# Patient Record
Sex: Female | Born: 1960 | Race: White | Hispanic: No | Marital: Married | Smoking: Former smoker
Health system: Southern US, Community
[De-identification: ages and names within clinical notes are randomized; demographics above are authoritative.]

## PROBLEM LIST (undated history)

## (undated) DIAGNOSIS — E782 Mixed hyperlipidemia: Secondary | ICD-10-CM

## (undated) DIAGNOSIS — I10 Essential (primary) hypertension: Secondary | ICD-10-CM

## (undated) DIAGNOSIS — I2510 Atherosclerotic heart disease of native coronary artery without angina pectoris: Secondary | ICD-10-CM

## (undated) DIAGNOSIS — I214 Non-ST elevation (NSTEMI) myocardial infarction: Secondary | ICD-10-CM

## (undated) DIAGNOSIS — E039 Hypothyroidism, unspecified: Secondary | ICD-10-CM

## (undated) DIAGNOSIS — J449 Chronic obstructive pulmonary disease, unspecified: Secondary | ICD-10-CM

## (undated) HISTORY — DX: Essential (primary) hypertension: I10

## (undated) HISTORY — DX: Non-ST elevation (NSTEMI) myocardial infarction: I21.4

## (undated) HISTORY — DX: Atherosclerotic heart disease of native coronary artery without angina pectoris: I25.10

## (undated) HISTORY — PX: TOTAL ABDOMINAL HYSTERECTOMY: SHX209

## (undated) HISTORY — PX: BREAST LUMPECTOMY: SHX2

## (undated) HISTORY — PX: TUMOR REMOVAL: SHX12

## (undated) HISTORY — DX: Mixed hyperlipidemia: E78.2

## (undated) HISTORY — DX: Hypothyroidism, unspecified: E03.9

---

## 2008-02-11 ENCOUNTER — Inpatient Hospital Stay (HOSPITAL_COMMUNITY): Admission: AD | Admit: 2008-02-11 | Discharge: 2008-02-16 | Payer: Self-pay | Admitting: Cardiology

## 2008-02-11 ENCOUNTER — Encounter: Payer: Self-pay | Admitting: Cardiology

## 2008-02-11 ENCOUNTER — Ambulatory Visit: Payer: Self-pay | Admitting: Cardiology

## 2008-03-06 ENCOUNTER — Encounter: Payer: Self-pay | Admitting: Physician Assistant

## 2008-03-06 ENCOUNTER — Ambulatory Visit: Payer: Self-pay | Admitting: Cardiology

## 2008-07-05 ENCOUNTER — Encounter: Payer: Self-pay | Admitting: Cardiology

## 2009-02-07 ENCOUNTER — Encounter: Payer: Self-pay | Admitting: Cardiology

## 2009-03-29 ENCOUNTER — Encounter: Payer: Self-pay | Admitting: Cardiology

## 2009-03-30 ENCOUNTER — Encounter (INDEPENDENT_AMBULATORY_CARE_PROVIDER_SITE_OTHER): Payer: Self-pay | Admitting: *Deleted

## 2009-05-15 DIAGNOSIS — E782 Mixed hyperlipidemia: Secondary | ICD-10-CM | POA: Insufficient documentation

## 2009-05-15 DIAGNOSIS — I2510 Atherosclerotic heart disease of native coronary artery without angina pectoris: Secondary | ICD-10-CM | POA: Insufficient documentation

## 2009-05-15 DIAGNOSIS — I10 Essential (primary) hypertension: Secondary | ICD-10-CM | POA: Insufficient documentation

## 2009-05-21 ENCOUNTER — Ambulatory Visit: Payer: Self-pay | Admitting: Cardiology

## 2009-05-21 DIAGNOSIS — J4489 Other specified chronic obstructive pulmonary disease: Secondary | ICD-10-CM | POA: Insufficient documentation

## 2009-05-21 DIAGNOSIS — R0602 Shortness of breath: Secondary | ICD-10-CM | POA: Insufficient documentation

## 2009-05-21 DIAGNOSIS — J449 Chronic obstructive pulmonary disease, unspecified: Secondary | ICD-10-CM | POA: Insufficient documentation

## 2009-06-06 ENCOUNTER — Encounter: Payer: Self-pay | Admitting: Cardiology

## 2009-06-25 ENCOUNTER — Encounter (INDEPENDENT_AMBULATORY_CARE_PROVIDER_SITE_OTHER): Payer: Self-pay | Admitting: *Deleted

## 2009-11-28 ENCOUNTER — Ambulatory Visit: Payer: Self-pay | Admitting: Cardiology

## 2009-12-04 ENCOUNTER — Encounter: Payer: Self-pay | Admitting: Cardiology

## 2009-12-12 ENCOUNTER — Encounter (INDEPENDENT_AMBULATORY_CARE_PROVIDER_SITE_OTHER): Payer: Self-pay | Admitting: *Deleted

## 2009-12-24 ENCOUNTER — Encounter (INDEPENDENT_AMBULATORY_CARE_PROVIDER_SITE_OTHER): Payer: Self-pay | Admitting: *Deleted

## 2009-12-25 ENCOUNTER — Telehealth (INDEPENDENT_AMBULATORY_CARE_PROVIDER_SITE_OTHER): Payer: Self-pay | Admitting: *Deleted

## 2010-02-12 ENCOUNTER — Ambulatory Visit (HOSPITAL_COMMUNITY): Admission: RE | Admit: 2010-02-12 | Discharge: 2010-02-12 | Payer: Self-pay | Admitting: Family Medicine

## 2010-10-22 NOTE — Assessment & Plan Note (Signed)
Summary: 6 mo fu per feb reminder-srs  Medications Added LEVOTHYROXINE SODIUM 100 MCG TABS (LEVOTHYROXINE SODIUM) Take 1 tablet by mouth once a day      Allergies Added: NKDA  Visit Type:  Follow-up Primary Provider:  Dr. Tessie Fass  CC:  follow-up visit.  History of Present Illness: the patient is a 50 year old female with a history of coronary artery disease, status post non-ST segment elevation myocardial infarction. The patient is status post placement of a Cypher drug-eluting stent to the right coronary artery 2 years ago. Chest otherwise mild nonobstructive coronary artery disease. Just preserved ejection fraction 65%. She has stopped smoking. She denies any chest pain shortness of breath orthopnea PND. She does report some weight gain. Last lipid panel was drawn and sent them for. Denies any palpitations or syncope. He does report some shortness of breath on exertion related to her COPD   Preventive Screening-Counseling & Management  Alcohol-Tobacco     Smoking Status: quit     Year Quit: 2005  Current Medications (verified): 1)  Simvastatin 40 Mg Tabs (Simvastatin) .... Take 1 Tablet Daily 2)  Aspirin Ec 325 Mg Tbec (Aspirin) .... Take One Tablet By Mouth Daily 3)  Metoprolol Tartrate 25 Mg Tabs (Metoprolol Tartrate) .... Take 1/2 Tablet By Mouth Twice A Day 4)  Levothyroxine Sodium 100 Mcg Tabs (Levothyroxine Sodium) .... Take 1 Tablet By Mouth Once A Day 5)  Nitroglycerin 0.4 Mg Subl (Nitroglycerin) .... One Tablet Under Tongue Every 5 Minutes As Needed For Chest Pain---May Repeat Times Three  Allergies (verified): No Known Drug Allergies  Comments:  Nurse/Medical Assistant: The patient's medications and allergies were reviewed with the patient and were updated in the Medication and Allergy Lists. List reviewed.  Past History:  Past Medical History: Last updated: 05/21/2009 HYPERLIPIDEMIA-MIXED (ICD-272.4) HYPERTENSION, UNSPECIFIED (ICD-401.9) CAD, NATIVE  VESSEL (ICD-414.01) status post non-ST elevation myocardial infarction Feb 11, 2008 treated with Cypher drug-eluting stent to the right coronary artery residual mild nonobstructive disease in the circumflex and left anterior descending artery. hypothyroidism  Past Surgical History: Last updated: 05/15/2009 Abdominal Hysterectomy-Total Lumpectomy  Social History: Last updated: 05/15/2009 Full Time Married  Tobacco Use - Former.  Alcohol Use - no Regular Exercise - yes Drug Use - no  Risk Factors: Exercise: yes (05/15/2009)  Risk Factors: Smoking Status: quit (11/28/2009)  Social History: Smoking Status:  quit  Review of Systems  The patient denies fatigue, malaise, fever, weight gain/loss, vision loss, decreased hearing, hoarseness, chest pain, palpitations, shortness of breath, prolonged cough, wheezing, sleep apnea, coughing up blood, abdominal pain, blood in stool, nausea, vomiting, diarrhea, heartburn, incontinence, blood in urine, muscle weakness, joint pain, leg swelling, rash, skin lesions, headache, fainting, dizziness, depression, anxiety, enlarged lymph nodes, easy bruising or bleeding, and environmental allergies.    Vital Signs:  Patient profile:   50 year old female Height:      62 inches Weight:      161 pounds Pulse rate:   52 / minute BP sitting:   114 / 69  (left arm) Cuff size:   regular  Vitals Entered By: Carlye Grippe (November 28, 2009 9:15 AM) CC: follow-up visit   Physical Exam  Additional Exam:  General: Well-developed, well-nourished in no distress head: Normocephalic and atraumatic eyes PERRLA/EOMI intact, conjunctiva and lids normal nose: No deformity or lesions mouth normal dentition, normal posterior pharynx neck: Supple, no JVD.  No masses, thyromegaly or abnormal cervical nodes lungs: decreased breath sounds bilaterally without wheezing.  Normal percussion  heart: regular rate and rhythm with normal S1 and S2, no S3 or S4.  PMI is  normal.  No pathological murmurs abdomen: Normal bowel sounds, abdomen is soft and nontender without masses, organomegaly or hernias noted.  No hepatosplenomegaly musculoskeletal: Back normal, normal gait muscle strength and tone normal pulsus: Pulse is normal in all 4 extremities Extremities: No peripheral pitting edema neurologic: Alert and oriented x 3 skin: Intact without lesions or rashes cervical nodes: No significant adenopathy psychologic: Normal affect    Impression & Recommendations:  Problem # 1:  CAD, NATIVE VESSEL (ICD-414.01) the patient has known coronary artery disease but reports no recurrent chest pain. No further ischemia workup as needed. The patient needs secondary prevention. I ordered a lipid panel and liver function test. She may need adjustment of her statins. She's currently off Plavix. The following medications were removed from the medication list:    Plavix 75 Mg Tabs (Clopidogrel bisulfate) .Marland Kitchen... Take one tablet by mouth daily Her updated medication list for this problem includes:    Aspirin Ec 325 Mg Tbec (Aspirin) .Marland Kitchen... Take one tablet by mouth daily    Metoprolol Tartrate 25 Mg Tabs (Metoprolol tartrate) .Marland Kitchen... Take 1/2 tablet by mouth twice a day    Nitroglycerin 0.4 Mg Subl (Nitroglycerin) ..... One tablet under tongue every 5 minutes as needed for chest pain---may repeat times three  Problem # 2:  COPD (ICD-496) the patient has dyspnea on exertion likely related to her COPD. She will need followup x-ray in the future due to significant scarring. This can also be followed by her primary care physician. The following medications were removed from the medication list:    Atrovent Hfa 17 Mcg/act Aers (Ipratropium bromide hfa) .Marland Kitchen... 2 puffs two times a day  Problem # 3:  HYPERLIPIDEMIA-MIXED (ICD-272.4) as outlined above Her updated medication list for this problem includes:    Simvastatin 40 Mg Tabs (Simvastatin) .Marland Kitchen... Take 1 tablet  daily  Orders: T-Hepatic Function [phone]) T-Lipid Profile [phone])  Problem # 4:  HYPERTENSION, UNSPECIFIED (ICD-401.9) blood pressure is poorly well-controlled no indication for further medication adjustment. Her updated medication list for this problem includes:    Aspirin Ec 325 Mg Tbec (Aspirin) .Marland Kitchen... Take one tablet by mouth daily    Metoprolol Tartrate 25 Mg Tabs (Metoprolol tartrate) .Marland Kitchen... Take 1/2 tablet by mouth twice a day  Patient Instructions: 1)  Your physician recommends that you return for a FASTING lipid/liver profile:AT THE [facility].  2)  Your physician recommends that you continue on your current medications as directed. Please refer to the Current Medication list given to you today. 3)  Your physician wants you to follow-up in: . You will receive a reminder letter in the mail about two months in advance. If you don't receive a letter, please call our office to schedule the follow-up appointment. Prescriptions: NITROGLYCERIN 0.4 MG SUBL (NITROGLYCERIN) One tablet under tongue every 5 minutes as needed for chest pain---may repeat times three  #25 x 2   Entered by:   Carlye Grippe   Authorized by:   Lewayne Bunting, MD, [facility]   Signed by:   Carlye Grippe on 11/28/2009   Method used:   Electronically to        [facility] # [phone]* (retail)       [address]       [place], [place]  [address]       Ph: [phone]       Fax: [phone]   RxID:  [id number] METOPROLOL TARTRATE 25 MG TABS (METOPROLOL TARTRATE) Take 1/2 tablet by mouth twice a day  #30 x 6   Entered by:   Carlye Grippe   Authorized by:   Lewayne Bunting, MD, [facility]   Signed by:   Carlye Grippe on 11/28/2009   Method used:   Electronically to        [facility] # [phone]* (retail)       [address]       [place], [place]  [address]       Ph: [phone]       Fax: [phone]   RxID:   [id number]

## 2010-10-22 NOTE — Letter (Signed)
Summary: Engineer, materials at [facility]  [address]   [place], [place] [address]   Phone: [phone]  Fax: [phone]        December 12, 2009 MRN: [id number]   [name] [address] [place], [place]  [address]   Dear [name],  Your test ordered by Selena Batten has been reviewed by your physician (or physician assistant) and was found to be normal or stable. Your physician (or physician assistant) felt no changes were needed at this time.  ____ Echocardiogram  ____ Cardiac Stress Test  __X__ Lab Work - lipids at goal  ____ Peripheral vascular study of arms, legs or neck  ____ CT scan or X-ray  ____ Lung or Breathing test  ____ Other:   Thank you.   Hoover Brunette, LPN    Duane Boston, M.D., F.A.C.C. Thressa Sheller, M.D., F.A.C.C. Oneal Grout, M.D., F.A.C.C. Cheree Ditto, M.D., F.A.C.C. Daiva Nakayama, M.D., F.A.C.C. Kenney Houseman, M.D., F.A.C.C. Jeanne Ivan, PA-C  Appended Document: [name] Results Letters Above letter came back "no mail receptacle".  Checked IDX and pt. has a post office box.  Will forward accordingly.

## 2010-10-22 NOTE — Progress Notes (Signed)
Summary: LAB RESULTS   Phone Note Call from Patient Call back at Home Phone [phone]   Reason for Call: Lab or Test Results Summary of Call: LAB RESULTS 12/04/09 Initial call taken by: Dorise Hiss  Follow-up for Phone Call        Patient notified.    Follow-up by: Hoover Brunette, LPN,  December 25, 2009 3:41 PM

## 2010-10-22 NOTE — Letter (Signed)
Summary: Engineer, materials at [facility]  [address]   [place], [place] [address]   Phone: [phone]  Fax: [phone]        December 24, 2009 MRN: [id number]   [name] [address] [place], [place]  [address]   Dear Ms. Lappe,  Your test ordered by Selena Batten has been reviewed by your physician (or physician assistant) and was found to be normal or stable. Your physician (or physician assistant) felt no changes were needed at this time.  ____ Echocardiogram  ____ Cardiac Stress Test  __X__ Lab Work - lipids at goal  ____ Peripheral vascular study of arms, legs or neck  ____ CT scan or X-ray  ____ Lung or Breathing test  ____ Other:   Thank you.   Hoover Brunette, LPN    Duane Boston, M.D., F.A.C.C. Thressa Sheller, M.D., F.A.C.C. Oneal Grout, M.D., F.A.C.C. Cheree Ditto, M.D., F.A.C.C. Daiva Nakayama, M.D., F.A.C.C. Kenney Houseman, M.D., F.A.C.C. Jeanne Ivan, PA-C

## 2011-01-15 ENCOUNTER — Other Ambulatory Visit: Payer: Self-pay | Admitting: Cardiology

## 2011-02-04 NOTE — Assessment & Plan Note (Signed)
[facility]                          [facility] OFFICE NOTE   ITZAYANA, PARDY                     MRN:          [id number]  DATE:03/06/2008                            DOB:          1961/01/18    CARDIOLOGIST:  Learta Codding, MD,FACC   PRIMARY CARE PHYSICIAN:  None.  She does follow with Dr. Ernestina Penna for  Gynecology.   REASON FOR VISIT:  Post hospitalization followup.   HISTORY OF PRESENT ILLNESS:  [name] is a very pleasant 50 year old  female patient who presented to [facility] on Feb 11, 2008, with  chest discomfort.  She had an abnormal electrocardiogram and was  transferred to [facility].  She eventually ruled in for a non-  ST-elevation myocardial infarction and was setup for a cardiac  catheterization on Feb 15, 2008.  This was done by Dr. Excell Seltzer and  revealed mild nonobstructive disease in the circumflex and LAD.  She did  have a 95% lesion in the mid RCA that was treated with a Cypher drug-  eluting stent.  Her EF was normal at 65% with normal wall motion.  She  was placed on a beta-blocker therapy as well as aspirin, Plavix, and  simvastatin.  She was eventually discharged home on Feb 16, 2008.   In follow-up today, the patient notes she is doing well.  She denies any  recurrence of her angina.  She does have an occasional chest pain that  is atypical and fleeting.  It is not related to exertion.  She denies  shortness of breath, orthopnea, PND, or pedal edema.  She denies  syncope, near syncope, or palpitations.   CURRENT MEDICATIONS:  1. Aspirin 325 mg daily.  2. Plavix 75 mg daily.  3. Lopressor 25 mg half tablet b.i.d.  4. Simvastatin 40 mg daily.  5. Levothyroxine 75 mcg daily.  6. Nitroglycerin p.r.n. chest pain.   ALLERGIES:  No known drug allergies.   PHYSICAL EXAMINATION:  She is a well-nourished, well-developed female in  no acute distress.  Blood pressure 109/69, pulse 67, and weight  146.8 pounds.  HEENT:  Normal.  NECK:  Without JVD.  CARDIAC:  Normal S1 and S2.  Regular rate and rhythm.  No murmur.  LUNGS:  Clear to auscultation bilaterally.  ABDOMEN:  Soft, nontender.  EXTREMITIES:  Without edema.  NEUROLOGIC:  She is alert and oriented x3.  Cranial nerves II through  XII are grossly intact.  VASCULAR:  No carotid artery bruits noted bilaterally.  Right femoral  arteriotomy site without hematoma or bruit.   Electrocardiogram reveals sinus rhythm with a heart rate of 65, normal  axis, and no acute changes.   IMPRESSION:  1. Coronary artery disease.      a.     Status post non-ST-elevation myocardial infarction, Feb 11, 2008, treated with a Cypher drug-eluting stent to the right       coronary artery.      b.     Residual mild nonobstructive disease in the circumflex and  left anterior descending.  2. Preserved left ventricular function with an ejection fraction of      65%.  3. Hypertension.  4. Treated dyslipidemia.  5. Treated hypothyroidism.  6. Ex-smoker.   PLAN:  1. [name] is doing well from a post myocardial infarction      standpoint.  She is walking about 15 minutes twice a day now.  She      is interested in cardiac rehab.  We will make that referral for      her.  2. She is doing well with her medications as listed.  She notes the      importance of Plavix.  She is applying for Plavix assistance due to      low finances.  3. She is now on simvastatin for lipid management.  She will need      followup lipids and LFTs in 12 weeks.  4. The patient is now cleared to go back to work and increase her      activity.  5. She will follow up in our office in the next 3 months or sooner      p.r.n.      Tereso Newcomer, PA-C  Electronically Signed      Learta Codding, MD,FACC  Electronically Signed   SW/MedQ  DD: 03/06/2008  DT: 03/07/2008  Job #: [id number]   cc:   Ernestina Penna
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
## 2011-03-03 ENCOUNTER — Other Ambulatory Visit: Payer: Self-pay | Admitting: Cardiology

## 2011-03-25 ENCOUNTER — Other Ambulatory Visit: Payer: Self-pay | Admitting: Cardiology

## 2011-03-28 ENCOUNTER — Encounter: Payer: Self-pay | Admitting: Cardiology

## 2011-04-14 ENCOUNTER — Other Ambulatory Visit: Payer: Self-pay | Admitting: Cardiology

## 2011-05-02 ENCOUNTER — Other Ambulatory Visit: Payer: Self-pay | Admitting: Cardiology

## 2011-05-02 NOTE — Telephone Encounter (Signed)
[facility] pt. 

## 2011-05-09 ENCOUNTER — Encounter: Payer: Self-pay | Admitting: *Deleted

## 2011-05-09 ENCOUNTER — Encounter: Payer: Self-pay | Admitting: Cardiovascular Disease

## 2011-05-09 ENCOUNTER — Ambulatory Visit (INDEPENDENT_AMBULATORY_CARE_PROVIDER_SITE_OTHER): Payer: PRIVATE HEALTH INSURANCE | Admitting: Cardiovascular Disease

## 2011-05-09 DIAGNOSIS — R079 Chest pain, unspecified: Secondary | ICD-10-CM

## 2011-05-09 DIAGNOSIS — E785 Hyperlipidemia, unspecified: Secondary | ICD-10-CM

## 2011-05-09 DIAGNOSIS — I25810 Atherosclerosis of coronary artery bypass graft(s) without angina pectoris: Secondary | ICD-10-CM

## 2011-05-09 DIAGNOSIS — I10 Essential (primary) hypertension: Secondary | ICD-10-CM

## 2011-05-09 DIAGNOSIS — I2510 Atherosclerotic heart disease of native coronary artery without angina pectoris: Secondary | ICD-10-CM

## 2011-05-09 NOTE — Assessment & Plan Note (Signed)
The patient had few episodes of chest pain recently which is early atypical overall. It's different from her previous myocardial infarction when she had substernal chest tightness. Nonetheless, I think she requires further evaluation to rule out the possibility of atypical manifestation of cardiac ischemia. Thus, I recommend to add treadmill nuclear stress test for further evaluation. I instructed her to hold metoprolol the night before and the morning of the test. If cardiac workup is negative and she continues to have this kind of discomfort, and I recommend an abdominal ultrasound to evaluate for possible gallstones.

## 2011-05-09 NOTE — Assessment & Plan Note (Signed)
Her blood pressure is well controlled. Continue current medications. 

## 2011-05-09 NOTE — Assessment & Plan Note (Signed)
Continue treatment with simvastatin. This is being followed by her primary care physician. Goal LDL is less than 70.

## 2011-05-09 NOTE — Patient Instructions (Signed)
Your physician you to follow up in 1 year (Dr. Earnestine Leys). You will receive a reminder letter in the mail one-two months in advance. If you don't receive a letter, please call our office to schedule the follow-up appointment. Your physician recommends that you continue on your current medications as directed. Please refer to the Current Medication list given to you today. Your physician has requested that you have en exercise stress cardiolite. For further information please visit [email or web address] Please follow instruction sheet, as given.

## 2011-05-09 NOTE — Progress Notes (Signed)
HPI  This is a 50 year old female who is here today for a followup visit. She has a known history of coronary artery disease status post non-ST elevation myocardial infarction in 2009. At that time, cardiac catheterization showed severe single-vessel coronary artery disease. She had a 95% stenosis in the right coronary artery and mild nonobstructive disease in the LAD and left circumflex. She had an angioplasty and drug-eluting stent placement with a Cypher stent to the right coronary artery without complications. No cardiac events since then. She has been doing reasonably well. However, recently she had 2 episodes of chest pain. It was right-sided and somewhat in the right upper quadrant of the abdomen. It happened at rest and lasted for about 15 minutes. She does complain of exertional dyspnea but no exertional chest pain. The patient has not had any stress testing since her angioplasty. She continues to take her medications regularly.  No Known Allergies   Current Outpatient Prescriptions on File Prior to Visit  Medication Sig Dispense Refill  . Levothyroxine Sodium 100 MCG CAPS Take by mouth daily.        . nitroGLYCERIN (NITROSTAT) 0.4 MG SL tablet Place 0.4 mg under the tongue every 5 (five) minutes as needed.        . simvastatin (ZOCOR) 40 MG tablet Take 1 tablet (40 mg total) by mouth at bedtime.  30 tablet  6     Past Medical History  Diagnosis Date  . Status post myomectomy Feb 11, 2008    no-ST elevation. Treated w Cypher drug-eluting stent to the right coronary artery  . Hypothyroidism   . Coronary atherosclerosis of native coronary artery   . Left anterior descending coronary artery thrombosis     residual mild non obsturctive disease in the circumflex and left anterior descedning artery  . HLD (hyperlipidemia)     mixed  . Unspecified essential hypertension      Past Surgical History  Procedure Date  . Total abdominal hysterectomy   . Breast lumpectomy   . Cardiac  catheterization      History reviewed. No pertinent family history.   History   Social History  . Marital Status: Married    Spouse Name: N/A    Number of Children: N/A  . Years of Education: N/A   Occupational History  . Not on file.   Social History Main Topics  . Smoking status: Former Smoker    Types: Cigarettes    Quit date: 09/22/2004  . Smokeless tobacco: Never Used  . Alcohol Use: No  . Drug Use: No  . Sexually Active: Not on file   Other Topics Concern  . Not on file   Social History Narrative   Full time. Married. Regularly exercises       PHYSICAL EXAM   BP 123/77  Pulse 58  Ht 5\' 2"  (1.575 m)  Wt 165 lb 4 oz (74.957 kg)  BMI 30.22 kg/m2  Constitutional: She is oriented to person, place, and time. She appears well-developed and well-nourished. No distress.  HENT: No nasal discharge.  Head: Normocephalic and atraumatic.  Eyes: Pupils are equal, round, and reactive to light. Right eye exhibits no discharge. Left eye exhibits no discharge.  Neck: Normal range of motion. Neck supple. No JVD present. No thyromegaly present.  Cardiovascular: Normal rate, regular rhythm, normal heart sounds and intact distal pulses. Exam reveals no gallop and no friction rub.  No murmur heard.  Pulmonary/Chest: Effort normal and breath sounds normal. No stridor. No respiratory  distress. She has no wheezes. She has no rales. She exhibits no tenderness.  Abdominal: Soft. Bowel sounds are normal. She exhibits no distension. There is no tenderness. There is no rebound and no guarding.  Musculoskeletal: Normal range of motion. She exhibits no edema and no tenderness.  Neurological: She is alert and oriented to person, place, and time. Coordination normal.  Skin: Skin is warm and dry. No rash noted. She is not diaphoretic. No erythema. No pallor.  Psychiatric: She has a normal mood and affect. Her behavior is normal. Judgment and thought content normal.     EKG: Sinus  bradycardia with a heart rate of 52 beats per minute. No significant ST or T wave changes.   ASSESSMENT AND PLAN

## 2011-05-12 ENCOUNTER — Telehealth: Payer: Self-pay | Admitting: *Deleted

## 2011-05-12 NOTE — Telephone Encounter (Signed)
Pt has [facility], per Chaires B, [phone], no precert required.

## 2011-05-12 NOTE — Telephone Encounter (Signed)
exercise stress cardiolite Set for 08-24 @ [facility] Checking percert

## 2011-05-13 ENCOUNTER — Encounter: Payer: Self-pay | Admitting: *Deleted

## 2011-05-15 DIAGNOSIS — R072 Precordial pain: Secondary | ICD-10-CM

## 2011-06-18 LAB — BASIC METABOLIC PANEL
BUN: 10
BUN: 9
CO2: 27
Calcium: 8.5
Chloride: 108
Chloride: 108
Chloride: 109
Creatinine, Ser: 0.8
Creatinine, Ser: 0.8
GFR calc Af Amer: >60
Glucose, Bld: 111 — ABNORMAL HIGH
Glucose, Bld: 87
Potassium: 4.4
Potassium: 4.5
Potassium: 4.5

## 2011-06-18 LAB — CBC
HCT: 38.9
HCT: 39.4
Hemoglobin: 12.4
Hemoglobin: 13.4
MCHC: 34.4
MCHC: 34.7
MCV: 92.6
MCV: 92.9
MCV: 94
Platelets: 236
RBC: 3.93
RDW: 13.4
WBC: 7.6

## 2011-06-18 LAB — LIPID PANEL
Cholesterol: 165
VLDL: 22

## 2011-06-18 LAB — CARDIAC PANEL(CRET KIN+CKTOT+MB+TROPI)
CK, MB: 33 — ABNORMAL HIGH
CK, MB: 42.5 — ABNORMAL HIGH
CK, MB: 75.1 — ABNORMAL HIGH
Relative Index: 11.8 — ABNORMAL HIGH
Relative Index: 13.3 — ABNORMAL HIGH
Relative Index: 7.7 — ABNORMAL HIGH
Relative Index: 8 — ABNORMAL HIGH
Total CK: 374 — ABNORMAL HIGH
Total CK: 635 — ABNORMAL HIGH
Troponin I: 3.06
Troponin I: 5.72
Troponin I: 6.46

## 2011-06-18 LAB — DIFFERENTIAL
Eosinophils Absolute: 0.2
Eosinophils Relative: 3
Lymphocytes Relative: 44
Monocytes Relative: 10
Neutro Abs: 3.1
Neutrophils Relative %: 43

## 2011-06-18 LAB — CK TOTAL AND CKMB (NOT AT ARMC): Total CK: 62

## 2011-06-30 ENCOUNTER — Other Ambulatory Visit: Payer: Self-pay | Admitting: Unknown Physician Specialty

## 2011-10-17 ENCOUNTER — Other Ambulatory Visit: Payer: Self-pay | Admitting: Cardiology

## 2011-11-17 ENCOUNTER — Other Ambulatory Visit: Payer: Self-pay | Admitting: Cardiology

## 2011-12-26 ENCOUNTER — Other Ambulatory Visit: Payer: Self-pay | Admitting: *Deleted

## 2011-12-26 MED ORDER — SIMVASTATIN 40 MG PO TABS: 40.0000 mg | ORAL_TABLET | Freq: Every day | ORAL | Status: DC

## 2012-06-05 ENCOUNTER — Other Ambulatory Visit: Payer: Self-pay | Admitting: Cardiology

## 2012-07-08 ENCOUNTER — Encounter: Payer: Self-pay | Admitting: Cardiology

## 2012-07-08 ENCOUNTER — Ambulatory Visit (INDEPENDENT_AMBULATORY_CARE_PROVIDER_SITE_OTHER): Payer: Self-pay | Admitting: Cardiology

## 2012-07-08 VITALS — BP 123/75 | HR 57 | Ht 62.0 in | Wt 150.0 lb

## 2012-07-08 DIAGNOSIS — I10 Essential (primary) hypertension: Secondary | ICD-10-CM

## 2012-07-08 DIAGNOSIS — I2510 Atherosclerotic heart disease of native coronary artery without angina pectoris: Secondary | ICD-10-CM

## 2012-07-08 DIAGNOSIS — J449 Chronic obstructive pulmonary disease, unspecified: Secondary | ICD-10-CM

## 2012-07-08 DIAGNOSIS — E785 Hyperlipidemia, unspecified: Secondary | ICD-10-CM

## 2012-07-08 DIAGNOSIS — Z79899 Other long term (current) drug therapy: Secondary | ICD-10-CM

## 2012-07-08 NOTE — Patient Instructions (Addendum)
Your physician recommends that you schedule a follow-up appointment in: 1 year. You will receive a reminder letter in the mail in about 10 months reminding you to call and schedule your appointment. If you don't receive this letter, please contact our office.  Your physician recommends that you continue on your current medications as directed. Please refer to the Current Medication list given to you today.  Your physician recommends that you return for a FASTING lipid/liver profile: at [facility] 07/09/12

## 2012-07-08 NOTE — Assessment & Plan Note (Signed)
She continues on Zocor. Due to for followup FLP and LFTs. These will be arranged.

## 2012-07-08 NOTE — Progress Notes (Signed)
   Clinical Summary [name] is a 51 y.o.female presenting for office followup. She was last seen by Dr. Kary Kos back in August 2012, previously seen by Dr. Andee Lineman. She has been doing well, no regular angina or nitroglycerin use. Occasionally he experienced shortness of breath with exertion, although states that she has been diagnosed with COPD. She has had no palpitations or syncope.  She underwent an exercise Cardiolite 8/12 showing no diagnostic ST changes, maximum workload 10.1 METs, no ischemic perfusion defects, LVEF 63%.  She has not had lipids obtained since last year.   No Known Allergies  Current Outpatient Prescriptions  Medication Sig Dispense Refill  . aspirin 81 MG tablet Take 81 mg by mouth daily.        . Levothyroxine Sodium 100 MCG CAPS Take by mouth daily.        . metoprolol tartrate (LOPRESSOR) 25 MG tablet Take 0.5 tablets (12.5 mg total) by mouth 2 (two) times daily.  30 tablet  6  . nitroGLYCERIN (NITROSTAT) 0.4 MG SL tablet Place 0.4 mg under the tongue every 5 (five) minutes as needed.        . simvastatin (ZOCOR) 40 MG tablet Take 1 tablet (40 mg total) by mouth at bedtime.  90 tablet  3  . DISCONTD: metoprolol tartrate (LOPRESSOR) 25 MG tablet TAKE ONE-HALF TABLET BY MOUTH TWICE DAILY  15 tablet  0  . DISCONTD: metoprolol tartrate (LOPRESSOR) 25 MG tablet          Past Medical History  Diagnosis Date  . Hypothyroidism   . Coronary atherosclerosis of native coronary artery     DES RCA 5/09, nonobstructive left system  . Mixed hyperlipidemia   . Essential hypertension, benign   . NSTEMI (non-ST elevated myocardial infarction)     2009    Social History [name] reports that she quit smoking about 7 years ago. Her smoking use included Cigarettes. She has never used smokeless tobacco. [name] reports that she does not drink alcohol.  Review of Systems No reported bleeding episodes. No syncope. Stable appetite. No cough or hemoptysis. Otherwise  negative.  Physical Examination Filed Vitals:   07/08/12 1300  BP: 123/75  Pulse: 57   Filed Weights   07/08/12 1300  Weight: 150 lb (68.04 kg)   Patient in no acute distress. HEENT: Conjunctiva and lids normal, oropharynx clear. Neck: Supple, no elevated JVP or carotid bruits, no thyromegaly. Lungs: Diminished but clear to auscultation, nonlabored breathing at rest. Cardiac: Regular rate and rhythm, no S3 or significant systolic murmur, no pericardial rub. Abdomen: Soft, nontender, bowel sounds present, no guarding or rebound. Extremities: No pitting edema, distal pulses 2+. Skin: Warm and dry. Musculoskeletal: No kyphosis. Neuropsychiatric: Alert and oriented x3, affect grossly appropriate.    Problem List and Plan   CAD, NATIVE VESSEL Symptomatically stable on medical therapy. ECG reviewed and normal today. Cardiolite from last year showed no active ischemia. No changes made in current medication. Continue observation.  Essential hypertension, benign Blood pressure is well-controlled today.  HYPERLIPIDEMIA-MIXED She continues on Zocor. Due to for followup FLP and LFTs. These will be arranged.  COPD Has not smoked for several years after long-term history. Denies any wheezing or hospitalizations. I asked her to keep up with Dr.Luking.    Jonelle Sidle, M.D., F.A.C.C. \

## 2012-07-08 NOTE — Assessment & Plan Note (Signed)
Has not smoked for several years after long-term history. Denies any wheezing or hospitalizations. I asked her to keep up with Dr.Luking.

## 2012-07-08 NOTE — Assessment & Plan Note (Signed)
Symptomatically stable on medical therapy. ECG reviewed and normal today. Cardiolite from last year showed no active ischemia. No changes made in current medication. Continue observation.

## 2012-07-08 NOTE — Assessment & Plan Note (Signed)
Blood pressure is well-controlled today. 

## 2012-07-12 ENCOUNTER — Other Ambulatory Visit: Payer: Self-pay | Admitting: Cardiology

## 2012-07-20 ENCOUNTER — Telehealth: Payer: Self-pay | Admitting: *Deleted

## 2012-07-20 NOTE — Telephone Encounter (Signed)
Message copied by Eustace Moore on Tue Jul 20, 2012  9:09 AM ------      Message from: Jonelle Sidle      Created: Fri Jul 16, 2012  3:41 PM       LFTs normal and LDL at goal. Looks good.

## 2012-07-20 NOTE — Telephone Encounter (Signed)
Patient informed. 

## 2012-12-27 ENCOUNTER — Other Ambulatory Visit: Payer: Self-pay | Admitting: Physician Assistant

## 2013-01-13 ENCOUNTER — Other Ambulatory Visit: Payer: Self-pay | Admitting: Cardiology

## 2013-01-13 MED ORDER — SIMVASTATIN 40 MG PO TABS: 40.0000 mg | ORAL_TABLET | Freq: Every day | ORAL | Status: DC

## 2013-07-18 ENCOUNTER — Other Ambulatory Visit: Payer: Self-pay | Admitting: *Deleted

## 2013-07-18 MED ORDER — METOPROLOL TARTRATE 25 MG PO TABS: ORAL_TABLET | ORAL | Status: DC

## 2013-07-26 ENCOUNTER — Encounter: Payer: Self-pay | Admitting: Cardiology

## 2013-07-26 ENCOUNTER — Ambulatory Visit (INDEPENDENT_AMBULATORY_CARE_PROVIDER_SITE_OTHER): Payer: Self-pay | Admitting: Cardiology

## 2013-07-26 VITALS — BP 147/88 | HR 69 | Ht 62.0 in | Wt 156.0 lb

## 2013-07-26 DIAGNOSIS — I10 Essential (primary) hypertension: Secondary | ICD-10-CM

## 2013-07-26 DIAGNOSIS — E785 Hyperlipidemia, unspecified: Secondary | ICD-10-CM

## 2013-07-26 DIAGNOSIS — I2510 Atherosclerotic heart disease of native coronary artery without angina pectoris: Secondary | ICD-10-CM

## 2013-07-26 NOTE — Patient Instructions (Signed)
   Lab for FLP/LFT  Reminder:  Nothing to eat or drink after 12 midnight prior to labs. Office will contact with results via phone or letter.    Your physician wants you to follow up in:  1 year.  You will receive a reminder letter in the mail one-two months in advance.  If you don't receive a letter, please call our office to schedule the follow up appointment

## 2013-07-26 NOTE — Assessment & Plan Note (Signed)
Blood pressure is mildly elevated today. Reminded her to keep followup with Dr. Gerda Diss. She might benefit from an additional agent such as an ACE inhibitor or ARB if blood pressure trend remains up.

## 2013-07-26 NOTE — Assessment & Plan Note (Signed)
Symptomatically stable on medical therapy. Last Cardiolite results reviewed. ECG stable. For now will continue observation. We did discuss warning signs.

## 2013-07-26 NOTE — Assessment & Plan Note (Signed)
Due for followup FLP and LFT, these will be arranged. Continue Zocor.

## 2013-07-26 NOTE — Progress Notes (Signed)
    Clinical Summary [name] is a 52 y.o.female last seen in October 2013. She reports no progressive angina symptoms, used a single nitroglycerin tablet within the last year. Does have chronic shortness of breath with COPD, worse in a seasonal fashion. She has a rescue inhaler but does not use it with any regularity.  She underwent an exercise Cardiolite 8/12 showing no diagnostic ST changes, maximum workload 10.1 METs, no ischemic perfusion defects, LVEF 63%.  Lab work from last October showed normal AST and ALT, triglycerides 167, cholesterol 163, HDL 49, LDL 81. No followup labs as yet.  ECG today shows normal sinus rhythm with nonspecific ST changes.   No Known Allergies  Current Outpatient Prescriptions  Medication Sig Dispense Refill  . aspirin 81 MG tablet Take 81 mg by mouth daily.        . Levothyroxine Sodium 100 MCG CAPS Take by mouth daily.        . metoprolol tartrate (LOPRESSOR) 25 MG tablet TAKE ONE-HALF TABLET BY MOUTH TWICE DAILY  30 tablet  6  . nitroGLYCERIN (NITROSTAT) 0.4 MG SL tablet Place 0.4 mg under the tongue every 5 (five) minutes as needed.        . simvastatin (ZOCOR) 40 MG tablet Take 1 tablet (40 mg total) by mouth at bedtime.  90 tablet  3   No current facility-administered medications for this visit.    Past Medical History  Diagnosis Date  . Hypothyroidism   . Coronary atherosclerosis of native coronary artery     DES RCA 5/09, nonobstructive left system  . Mixed hyperlipidemia   . Essential hypertension, benign   . NSTEMI (non-ST elevated myocardial infarction)     2009    Social History Ms. Spano reports that she quit smoking about 8 years ago. Her smoking use included Cigarettes. She smoked 0.00 packs per day. She has never used smokeless tobacco. Ms. Favorite reports that she does not drink alcohol.  Review of Systems No palpitations, syncope, bleeding problems. No claudication. No cardiac hospitalizations. Otherwise  negative.  Physical Examination Filed Vitals:   07/26/13 1315  BP: 147/88  Pulse: 69   Filed Weights   07/26/13 1315  Weight: 156 lb (70.761 kg)    Patient in no acute distress.  HEENT: Conjunctiva and lids normal, oropharynx clear.  Neck: Supple, no elevated JVP or carotid bruits, no thyromegaly.  Lungs: Diminished but clear to auscultation, nonlabored breathing at rest.  Cardiac: Regular rate and rhythm, no S3 or significant systolic murmur, no pericardial rub.  Abdomen: Soft, nontender, bowel sounds present, no guarding or rebound.  Extremities: No pitting edema, distal pulses 2+.  Skin: Warm and dry.  Musculoskeletal: No kyphosis.  Neuropsychiatric: Alert and oriented x3, affect grossly appropriate.   Problem List and Plan   CAD, NATIVE VESSEL Symptomatically stable on medical therapy. Last Cardiolite results reviewed. ECG stable. For now will continue observation. We did discuss warning signs.  HYPERLIPIDEMIA-MIXED Due for followup FLP and LFT, these will be arranged. Continue Zocor.  Essential hypertension, benign Blood pressure is mildly elevated today. Reminded her to keep followup with Dr. Gerda Diss. She might benefit from an additional agent such as an ACE inhibitor or ARB if blood pressure trend remains up.    Jonelle Sidle, M.D., F.A.C.C.

## 2013-08-03 ENCOUNTER — Other Ambulatory Visit: Payer: Self-pay | Admitting: Cardiology

## 2013-08-03 LAB — HEPATIC FUNCTION PANEL
Alkaline Phosphatase: 62 U/L (ref 39–117)
Indirect Bilirubin: 0.6 mg/dL (ref 0.0–0.9)
Total Bilirubin: 0.7 mg/dL (ref 0.3–1.2)

## 2013-08-03 LAB — LIPID PANEL
LDL Cholesterol: 75 mg/dL (ref 0–99)
VLDL: 24 mg/dL (ref 0–40)

## 2013-08-04 ENCOUNTER — Telehealth: Payer: Self-pay

## 2013-08-04 NOTE — Telephone Encounter (Signed)
Message copied by Nori Riis on Thu Aug 04, 2013  8:26 AM ------      Message from: MCDOWELL, Illene Bolus      Created: Thu Aug 04, 2013  8:09 AM       Reviewed. LDL is at goal at 75, and LFTs normal. Continue Zocor. ------

## 2013-08-04 NOTE — Telephone Encounter (Signed)
Pt informed

## 2013-08-25 ENCOUNTER — Ambulatory Visit: Payer: Self-pay | Admitting: Cardiology

## 2014-02-06 ENCOUNTER — Ambulatory Visit: Payer: Self-pay | Admitting: Family Medicine

## 2014-02-15 ENCOUNTER — Ambulatory Visit: Payer: Self-pay | Admitting: Family Medicine

## 2014-02-15 LAB — HM MAMMOGRAPHY: HM MAMMO: NORMAL

## 2014-03-06 ENCOUNTER — Other Ambulatory Visit: Payer: Self-pay | Admitting: *Deleted

## 2014-03-06 MED ORDER — SIMVASTATIN 40 MG PO TABS: 40.0000 mg | ORAL_TABLET | Freq: Every day | ORAL | Status: DC

## 2014-04-03 ENCOUNTER — Other Ambulatory Visit: Payer: Self-pay | Admitting: *Deleted

## 2014-04-03 MED ORDER — METOPROLOL TARTRATE 25 MG PO TABS: ORAL_TABLET | ORAL | Status: DC

## 2014-04-07 ENCOUNTER — Ambulatory Visit (HOSPITAL_COMMUNITY)
Admission: RE | Admit: 2014-04-07 | Discharge: 2014-04-07 | Disposition: A | Discharge status: Home / Self Care | Payer: Disability Insurance | Source: Ambulatory Visit | Attending: Family Medicine | Admitting: Family Medicine

## 2014-04-07 ENCOUNTER — Other Ambulatory Visit (HOSPITAL_COMMUNITY): Payer: Self-pay | Admitting: *Deleted

## 2014-04-07 DIAGNOSIS — M8500 Fibrous dysplasia (monostotic), unspecified site: Secondary | ICD-10-CM

## 2014-04-07 DIAGNOSIS — M546 Pain in thoracic spine: Secondary | ICD-10-CM | POA: Insufficient documentation

## 2014-05-19 LAB — PULMONARY FUNCTION TEST

## 2014-07-27 ENCOUNTER — Encounter: Payer: Self-pay | Admitting: *Deleted

## 2014-07-27 ENCOUNTER — Ambulatory Visit (INDEPENDENT_AMBULATORY_CARE_PROVIDER_SITE_OTHER): Payer: BC Managed Care – PPO | Admitting: *Deleted

## 2014-07-27 ENCOUNTER — Ambulatory Visit (INDEPENDENT_AMBULATORY_CARE_PROVIDER_SITE_OTHER): Payer: BC Managed Care – PPO | Admitting: Cardiology

## 2014-07-27 ENCOUNTER — Encounter: Payer: Self-pay | Admitting: Cardiology

## 2014-07-27 VITALS — BP 124/83 | HR 62 | Ht 61.0 in | Wt 162.0 lb

## 2014-07-27 DIAGNOSIS — Z23 Encounter for immunization: Secondary | ICD-10-CM

## 2014-07-27 DIAGNOSIS — I2510 Atherosclerotic heart disease of native coronary artery without angina pectoris: Secondary | ICD-10-CM

## 2014-07-27 DIAGNOSIS — I10 Essential (primary) hypertension: Secondary | ICD-10-CM

## 2014-07-27 DIAGNOSIS — R0602 Shortness of breath: Secondary | ICD-10-CM

## 2014-07-27 DIAGNOSIS — E782 Mixed hyperlipidemia: Secondary | ICD-10-CM

## 2014-07-27 NOTE — Assessment & Plan Note (Signed)
I have recommended that she make a follow-up visit with Dr. Wolfgang Phoenix for a physical with lab work. She has tolerated Zocor, and lipids have been well controlled in the past.

## 2014-07-27 NOTE — Assessment & Plan Note (Signed)
History of DES to the RCA in 2009 with nonobstructive left system disease. She is reporting more shortness of breath with activity as outlined above, intermittent angina symptoms. Last ischemic testing was greater than 3 years ago. We will arrange a follow-up Lexiscan Cardiolite for further evaluation.

## 2014-07-27 NOTE — Assessment & Plan Note (Signed)
Blood pressure today is normal. 

## 2014-07-27 NOTE — Patient Instructions (Signed)
Your physician recommends that you schedule a follow-up appointment in: 6 months. You will receive a reminder letter in the mail in about 4 months reminding you to call and schedule your appointment. If you don't receive this letter, please contact our office. Your physician recommends that you continue on your current medications as directed. Please refer to the Current Medication list given to you today. Your physician has requested that you have a lexiscan myoview. For further information please visit [email or web address]. Please follow instruction sheet, as given. Please contact Dr. Lance Sell office for an appointment.

## 2014-07-27 NOTE — Progress Notes (Signed)
Reason for visit: CAD, hyperlipidemia, hypertension  Clinical Summary [name] is a 53 y.o.female last seen in November 2014. She presents for a routine visit. She states that she does have intermittent angina symptoms, has used nitroglycerin only a few times in the last year however. She does feel more short of breath in general, NYHA class 2-3. She has not been exercising regularly however. She reports compliance with her medications including aspirin, beta blocker, and statin. ECG today in the office shows normal sinus rhythm.  She underwent an exercise Cardiolite 8/12 showing no diagnostic ST changes, maximum workload 10.1 METs, no ischemic perfusion defects, LVEF 63%. I reviewed the results today, and we discussed a follow-up stress test in light of her symptoms.  Lipid panel from November 2014 showed cholesterol 145, triglycerides 121, HDL 46, and LDL 75. She has not seen Dr. Wolfgang Myers for quite some time, plans to arrange a physical with him soon however.  No Known Allergies  Current Outpatient Prescriptions  Medication Sig Dispense Refill  . aspirin 81 MG tablet Take 81 mg by mouth daily.      . Levothyroxine Sodium 100 MCG CAPS Take by mouth daily.      . metoprolol tartrate (LOPRESSOR) 25 MG tablet TAKE ONE-HALF TABLET BY MOUTH TWICE DAILY 30 tablet 6  . nitroGLYCERIN (NITROSTAT) 0.4 MG SL tablet Place 0.4 mg under the tongue every 5 (five) minutes as needed.      . simvastatin (ZOCOR) 40 MG tablet Take 1 tablet (40 mg total) by mouth at bedtime. 90 tablet 3   No current facility-administered medications for this visit.    Past Medical History  Diagnosis Date  . Hypothyroidism   . Coronary atherosclerosis of native coronary artery     DES RCA 5/09, nonobstructive left system  . Mixed hyperlipidemia   . Essential hypertension, benign   . NSTEMI (non-ST elevated myocardial infarction)     2009    Social History Ms. Ayars reports that she quit smoking about 9 years ago.  Her smoking use included Cigarettes. She started smoking about 41 years ago. She smoked 0.00 packs per day. She has never used smokeless tobacco. Ms. Satterwhite reports that she does not drink alcohol.  Review of Systems Complete review of systems negative except as otherwise outlined in the clinical summary.  Physical Examination Filed Vitals:   07/27/14 0916  BP: 124/83  Pulse: 62   Filed Weights   07/27/14 0916  Weight: 162 lb (73.483 kg)    Appears comfortable at rest.  HEENT: Conjunctiva and lids normal, oropharynx clear.  Neck: Supple, no elevated JVP or carotid bruits, no thyromegaly.  Lungs: Diminished but clear to auscultation, nonlabored breathing at rest.  Cardiac: Regular rate and rhythm, no S3 or significant systolic murmur, no pericardial rub.  Abdomen: Soft, nontender, bowel sounds present, no guarding or rebound.  Extremities: No pitting edema, distal pulses 2+.  Skin: Warm and dry.  Musculoskeletal: No kyphosis.  Neuropsychiatric: Alert and oriented x3, affect grossly appropriate.   Problem List and Plan   CAD, NATIVE VESSEL History of DES to the RCA in 2009 with nonobstructive left system disease. She is reporting more shortness of breath with activity as outlined above, intermittent angina symptoms. Last ischemic testing was greater than 3 years ago. We will arrange a follow-up Lexiscan Cardiolite for further evaluation.  Essential hypertension, benign Blood pressure today is normal.  Mixed hyperlipidemia I have recommended that she make a follow-up visit with Dr. Wolfgang Myers for a  physical with lab work. She has tolerated Zocor, and lipids have been well controlled in the past.    Satira Sark, M.D., F.A.C.C.

## 2014-07-31 ENCOUNTER — Telehealth: Payer: Self-pay | Admitting: Family Medicine

## 2014-07-31 ENCOUNTER — Other Ambulatory Visit: Payer: Self-pay | Admitting: *Deleted

## 2014-07-31 DIAGNOSIS — I10 Essential (primary) hypertension: Secondary | ICD-10-CM

## 2014-07-31 DIAGNOSIS — E785 Hyperlipidemia, unspecified: Secondary | ICD-10-CM

## 2014-07-31 DIAGNOSIS — E039 Hypothyroidism, unspecified: Secondary | ICD-10-CM

## 2014-07-31 DIAGNOSIS — Z79899 Other long term (current) drug therapy: Secondary | ICD-10-CM

## 2014-07-31 NOTE — Telephone Encounter (Signed)
08/03/13: lip, liv

## 2014-07-31 NOTE — Telephone Encounter (Signed)
Notified patient via VM stating blood work orders are in. 

## 2014-07-31 NOTE — Telephone Encounter (Signed)
Patient needs lab papers has appointment for wellness on 12/2. Would like them by Thursday if possible .

## 2014-07-31 NOTE — Telephone Encounter (Signed)
TSH,lipid,liv,met 7 

## 2014-08-03 ENCOUNTER — Encounter (HOSPITAL_COMMUNITY)
Admission: RE | Admit: 2014-08-03 | Discharge: 2014-08-03 | Disposition: A | Discharge status: Home / Self Care | Payer: BC Managed Care – PPO | Source: Ambulatory Visit | Attending: Cardiology | Admitting: Cardiology

## 2014-08-03 ENCOUNTER — Ambulatory Visit (HOSPITAL_COMMUNITY)
Admission: RE | Admit: 2014-08-03 | Discharge: 2014-08-03 | Disposition: A | Discharge status: Home / Self Care | Payer: BC Managed Care – PPO | Source: Ambulatory Visit | Attending: Cardiology | Admitting: Cardiology

## 2014-08-03 ENCOUNTER — Encounter (HOSPITAL_COMMUNITY): Payer: Self-pay

## 2014-08-03 DIAGNOSIS — I2510 Atherosclerotic heart disease of native coronary artery without angina pectoris: Secondary | ICD-10-CM | POA: Insufficient documentation

## 2014-08-03 DIAGNOSIS — R079 Chest pain, unspecified: Secondary | ICD-10-CM | POA: Diagnosis not present

## 2014-08-03 DIAGNOSIS — I10 Essential (primary) hypertension: Secondary | ICD-10-CM | POA: Insufficient documentation

## 2014-08-03 DIAGNOSIS — R0602 Shortness of breath: Secondary | ICD-10-CM | POA: Insufficient documentation

## 2014-08-03 LAB — HEPATIC FUNCTION PANEL
ALBUMIN: 4.1 g/dL (ref 3.5–5.2)
ALK PHOS: 57 U/L (ref 39–117)
ALT: 12 U/L (ref 0–35)
AST: 15 U/L (ref 0–37)
Bilirubin, Direct: 0.1 mg/dL (ref 0.0–0.3)
Indirect Bilirubin: 0.5 mg/dL (ref 0.2–1.2)
TOTAL PROTEIN: 6.7 g/dL (ref 6.0–8.3)
Total Bilirubin: 0.6 mg/dL (ref 0.2–1.2)

## 2014-08-03 LAB — LIPID PANEL
Cholesterol: 133 mg/dL (ref 0–200)
HDL: 45 mg/dL (ref >39)
LDL CALC: 64 mg/dL (ref 0–99)
TRIGLYCERIDES: 122 mg/dL (ref <150)
Total CHOL/HDL Ratio: 3 Ratio
VLDL: 24 mg/dL (ref 0–40)

## 2014-08-03 LAB — TSH: TSH: 8.22 u[IU]/mL — ABNORMAL HIGH (ref 0.350–4.500)

## 2014-08-03 LAB — BASIC METABOLIC PANEL
BUN: 13 mg/dL (ref 6–23)
CALCIUM: 9.1 mg/dL (ref 8.4–10.5)
CHLORIDE: 105 meq/L (ref 96–112)
CO2: 29 mEq/L (ref 19–32)
CREATININE: 0.77 mg/dL (ref 0.50–1.10)
Glucose, Bld: 96 mg/dL (ref 70–99)
Potassium: 4.3 mEq/L (ref 3.5–5.3)
Sodium: 140 mEq/L (ref 135–145)

## 2014-08-03 MED ORDER — SODIUM CHLORIDE 0.9 % IJ SOLN
10.0000 mL | INTRAMUSCULAR | Status: DC | PRN
  Administered 2014-08-03: 10 mL via INTRAVENOUS
  Filled 2014-08-03: qty 10

## 2014-08-03 MED ORDER — REGADENOSON 0.4 MG/5ML IV SOLN
INTRAVENOUS | Status: AC
  Administered 2014-08-03: 0.4 mg via INTRAVENOUS
  Filled 2014-08-03: qty 5

## 2014-08-03 MED ORDER — REGADENOSON 0.4 MG/5ML IV SOLN
0.4000 mg | Freq: Once | INTRAVENOUS | Status: AC | PRN
  Administered 2014-08-03: 0.4 mg via INTRAVENOUS

## 2014-08-03 MED ORDER — TECHNETIUM TC 99M SESTAMIBI - CARDIOLITE
10.0000 | Freq: Once | INTRAVENOUS | Status: AC | PRN
  Administered 2014-08-03: 9.9 via INTRAVENOUS

## 2014-08-03 MED ORDER — TECHNETIUM TC 99M SESTAMIBI GENERIC - CARDIOLITE
30.0000 | Freq: Once | INTRAVENOUS | Status: AC | PRN
  Administered 2014-08-03: 30 via INTRAVENOUS

## 2014-08-03 MED ORDER — SODIUM CHLORIDE 0.9 % IJ SOLN
INTRAMUSCULAR | Status: AC
  Administered 2014-08-03: 10 mL via INTRAVENOUS
  Filled 2014-08-03: qty 10

## 2014-08-03 NOTE — Progress Notes (Signed)
Stress Lab Nurses Notes - [place] 08/03/2014 Reason for doing test: CAD, Chest Pain and SOB Type of test: Wille Glaser Nurse performing test: Gerrit Halls, RN Nuclear Medicine Tech: Melburn Hake Echo Tech: Not Applicable MD performing test: S. McDowell/K.Purcell Nails NP Family MD: Sallee Lange Test explained and consent signed: Yes.   IV started: Saline lock flushed, No redness or edema and Saline lock started in radiology Symptoms: Chest pressure & stomach discomfort Treatment/Intervention: None Reason test stopped: protocol completed After recovery IV was: Discontinued via X-ray tech and No redness or edema Patient to return to Nuc. Med at : 11:15 Patient discharged: Home Patient's Condition upon discharge was: stable Comments: During test BP 118/71 & HR 88.  Recovery BP 117/95 & HR 75.  Symptoms resolved in recovery.  Geanie Cooley T

## 2014-08-04 ENCOUNTER — Telehealth: Payer: Self-pay | Admitting: *Deleted

## 2014-08-04 NOTE — Telephone Encounter (Signed)
-----   Message from Satira Sark, MD sent at 08/04/2014  7:46 AM EST ----- Reviewed. Let her know that the study was reassuring, overall low risk without any large ischemic territories. Would continue medical therapy unless symptoms worsen.

## 2014-08-04 NOTE — Telephone Encounter (Signed)
Patient informed. 

## 2014-08-07 MED ORDER — LEVOTHYROXINE SODIUM 112 MCG PO TABS: 112.0000 ug | ORAL_TABLET | Freq: Every day | ORAL | Status: DC

## 2014-08-14 ENCOUNTER — Ambulatory Visit (INDEPENDENT_AMBULATORY_CARE_PROVIDER_SITE_OTHER): Payer: BC Managed Care – PPO | Admitting: Family Medicine

## 2014-08-14 ENCOUNTER — Encounter: Payer: Self-pay | Admitting: Family Medicine

## 2014-08-14 VITALS — Temp 98.8°F | Wt 160.0 lb

## 2014-08-14 DIAGNOSIS — J683 Other acute and subacute respiratory conditions due to chemicals, gases, fumes and vapors: Secondary | ICD-10-CM

## 2014-08-14 DIAGNOSIS — J45909 Unspecified asthma, uncomplicated: Secondary | ICD-10-CM

## 2014-08-14 DIAGNOSIS — J208 Acute bronchitis due to other specified organisms: Secondary | ICD-10-CM

## 2014-08-14 MED ORDER — LEVOTHYROXINE SODIUM 112 MCG PO TABS: 112.0000 ug | ORAL_TABLET | Freq: Every day | ORAL | Status: DC

## 2014-08-14 MED ORDER — ALBUTEROL SULFATE (2.5 MG/3ML) 0.083% IN NEBU
2.5000 mg | INHALATION_SOLUTION | Freq: Once | RESPIRATORY_TRACT | Status: AC
  Administered 2014-08-14: 2.5 mg via RESPIRATORY_TRACT

## 2014-08-14 MED ORDER — AZITHROMYCIN 250 MG PO TABS: ORAL_TABLET | ORAL | Status: DC

## 2014-08-14 MED ORDER — PREDNISONE 20 MG PO TABS: ORAL_TABLET | ORAL | Status: DC

## 2014-08-14 MED ORDER — ALBUTEROL SULFATE HFA 108 (90 BASE) MCG/ACT IN AERS: 2.0000 | INHALATION_SPRAY | Freq: Four times a day (QID) | RESPIRATORY_TRACT | Status: DC | PRN

## 2014-08-14 MED ORDER — ALBUTEROL SULFATE (2.5 MG/3ML) 0.083% IN NEBU: 2.5000 mg | INHALATION_SOLUTION | RESPIRATORY_TRACT | Status: DC | PRN

## 2014-08-14 NOTE — Progress Notes (Signed)
   Subjective:    Patient ID: [name], female    DOB: 06-13-1961, 53 y.o.   MRN: [id number]  Cough Associated symptoms include headaches, myalgias, nasal congestion and wheezing. Associated symptoms comments: diarrhea. Treatments tried: mucinex, claritin, OTC cough med.   PMH hypothyroid hyperlipidemia  Review of Systems  Respiratory: Positive for cough and wheezing.   Musculoskeletal: Positive for myalgias.  Neurological: Positive for headaches.   Denies high fever    Objective:   Physical Exam  Bilateral expiratory wheezes, heart regular, neck supple eardrums normal sinus nontender throat normal  Neurologic dreaming given without any problems. Moderate improvement.    Assessment & Plan:  Reactive airway Prednisone taper Antibiotic prescribed Acute bronchitis Thyroid adjustment was made recheck labs in 3 months

## 2014-08-14 NOTE — Patient Instructions (Signed)
How to Use an Inhaler Proper inhaler technique is very important. Good technique ensures that the medicine reaches the lungs. Poor technique results in depositing the medicine on the tongue and back of the throat rather than in the airways. If you do not use the inhaler with good technique, the medicine will not help you. STEPS TO FOLLOW IF USING AN INHALER WITHOUT AN EXTENSION TUBE 1. Remove the cap from the inhaler. 2. If you are using the inhaler for the first time, you will need to prime it. Shake the inhaler for 5 seconds and release four puffs into the air, away from your face. Ask your health care provider or pharmacist if you have questions about priming your inhaler. 3. Shake the inhaler for 5 seconds before each breath in (inhalation). 4. Position the inhaler so that the top of the canister faces up. 5. Put your index finger on the top of the medicine canister. Your thumb supports the bottom of the inhaler. 6. Open your mouth. 7. Either place the inhaler between your teeth and place your lips tightly around the mouthpiece, or hold the inhaler 1-2 inches away from your open mouth. If you are unsure of which technique to use, ask your health care provider. 8. Breathe out (exhale) normally and as completely as possible. 9. Press the canister down with your index finger to release the medicine. 10. At the same time as the canister is pressed, inhale deeply and slowly until your lungs are completely filled. This should take 4-6 seconds. Keep your tongue down. 11. Hold the medicine in your lungs for 5-10 seconds (10 seconds is best). This helps the medicine get into the small airways of your lungs. 12. Breathe out slowly, through pursed lips. Whistling is an example of pursed lips. 13. Wait at least 15-30 seconds between puffs. Continue with the above steps until you have taken the number of puffs your health care provider has ordered. Do not use the inhaler more than your health care provider  tells you. 14. Replace the cap on the inhaler. 15. Follow the directions from your health care provider or the inhaler insert for cleaning the inhaler. STEPS TO FOLLOW IF USING AN INHALER WITH AN EXTENSION (SPACER) 1. Remove the cap from the inhaler. 2. If you are using the inhaler for the first time, you will need to prime it. Shake the inhaler for 5 seconds and release four puffs into the air, away from your face. Ask your health care provider or pharmacist if you have questions about priming your inhaler. 3. Shake the inhaler for 5 seconds before each breath in (inhalation). 4. Place the open end of the spacer onto the mouthpiece of the inhaler. 5. Position the inhaler so that the top of the canister faces up and the spacer mouthpiece faces you. 6. Put your index finger on the top of the medicine canister. Your thumb supports the bottom of the inhaler and the spacer. 7. Breathe out (exhale) normally and as completely as possible. 8. Immediately after exhaling, place the spacer between your teeth and into your mouth. Close your lips tightly around the spacer. 9. Press the canister down with your index finger to release the medicine. 10. At the same time as the canister is pressed, inhale deeply and slowly until your lungs are completely filled. This should take 4-6 seconds. Keep your tongue down and out of the way. 11. Hold the medicine in your lungs for 5-10 seconds (10 seconds is best). This helps the   medicine get into the small airways of your lungs. Exhale. 12. Repeat inhaling deeply through the spacer mouthpiece. Again hold that breath for up to 10 seconds (10 seconds is best). Exhale slowly. If it is difficult to take this second deep breath through the spacer, breathe normally several times through the spacer. Remove the spacer from your mouth. 13. Wait at least 15-30 seconds between puffs. Continue with the above steps until you have taken the number of puffs your health care provider has  ordered. Do not use the inhaler more than your health care provider tells you. 14. Remove the spacer from the inhaler, and place the cap on the inhaler. 15. Follow the directions from your health care provider or the inhaler insert for cleaning the inhaler and spacer. If you are using different kinds of inhalers, use your quick relief medicine to open the airways 10-15 minutes before using a steroid if instructed to do so by your health care provider. If you are unsure which inhalers to use and the order of using them, ask your health care provider, nurse, or respiratory therapist. If you are using a steroid inhaler, always rinse your mouth with water after your last puff, then gargle and spit out the water. Do not swallow the water. AVOID:  Inhaling before or after starting the spray of medicine. It takes practice to coordinate your breathing with triggering the spray.  Inhaling through the nose (rather than the mouth) when triggering the spray. HOW TO DETERMINE IF YOUR INHALER IS FULL OR NEARLY EMPTY You cannot know when an inhaler is empty by shaking it. A few inhalers are now being made with dose counters. Ask your health care provider for a prescription that has a dose counter if you feel you need that extra help. If your inhaler does not have a counter, ask your health care provider to help you determine the date you need to refill your inhaler. Write the refill date on a calendar or your inhaler canister. Refill your inhaler 7-10 days before it runs out. Be sure to keep an adequate supply of medicine. This includes making sure it is not expired, and that you have a spare inhaler.  SEEK MEDICAL CARE IF:   Your symptoms are only partially relieved with your inhaler.  You are having trouble using your inhaler.  You have some increase in phlegm. SEEK IMMEDIATE MEDICAL CARE IF:   You feel little or no relief with your inhalers. You are still wheezing and are feeling shortness of breath or  tightness in your chest or both.  You have dizziness, headaches, or a fast heart rate.  You have chills, fever, or night sweats.  You have a noticeable increase in phlegm production, or there is blood in the phlegm. MAKE SURE YOU:   Understand these instructions.  Will watch your condition.  Will get help right away if you are not doing well or get worse. Document Released: 09/05/2000 Document Revised: 06/29/2013 Document Reviewed: 04/07/2013 Exit[employer] Patient Information 2015 [employer]. This information is not intended to replace advice given to you by your health care provider. Make sure you discuss any questions you have with your health care provider.  

## 2014-08-23 ENCOUNTER — Encounter: Payer: Self-pay | Admitting: Family Medicine

## 2014-08-23 ENCOUNTER — Ambulatory Visit (INDEPENDENT_AMBULATORY_CARE_PROVIDER_SITE_OTHER): Payer: BC Managed Care – PPO | Admitting: Family Medicine

## 2014-08-23 VITALS — BP 120/68 | Ht 60.5 in | Wt 159.0 lb

## 2014-08-23 DIAGNOSIS — E782 Mixed hyperlipidemia: Secondary | ICD-10-CM

## 2014-08-23 DIAGNOSIS — J449 Chronic obstructive pulmonary disease, unspecified: Secondary | ICD-10-CM

## 2014-08-23 DIAGNOSIS — E038 Other specified hypothyroidism: Secondary | ICD-10-CM

## 2014-08-23 DIAGNOSIS — E039 Hypothyroidism, unspecified: Secondary | ICD-10-CM | POA: Insufficient documentation

## 2014-08-23 DIAGNOSIS — Z23 Encounter for immunization: Secondary | ICD-10-CM

## 2014-08-23 DIAGNOSIS — J4489 Other specified chronic obstructive pulmonary disease: Secondary | ICD-10-CM | POA: Insufficient documentation

## 2014-08-23 MED ORDER — TIOTROPIUM BROMIDE MONOHYDRATE 18 MCG IN CAPS: 18.0000 ug | ORAL_CAPSULE | Freq: Every day | RESPIRATORY_TRACT | Status: DC

## 2014-08-23 NOTE — Patient Instructions (Signed)
Do your labs in 8 weeks

## 2014-08-23 NOTE — Progress Notes (Signed)
   Subjective:    Patient ID: [name], female    DOB: 11/19/1960, 53 y.o.   MRN: [id number]  Hyperlipidemia This is a chronic problem. The current episode started more than 1 year ago. Associated symptoms include shortness of breath. Pertinent negatives include no chest pain. Treatments tried: simvastatin. Compliance problems include adherence to exercise.    No concerns. Already had flu vaccine.  Patient has history of smoking she quit several years back has some intermittent coughing denies hemoptysis denies weight loss.  Review of Systems  Constitutional: Negative for activity change, appetite change and fatigue.  HENT: Negative for congestion.   Respiratory: Positive for cough and shortness of breath.   Cardiovascular: Negative for chest pain.  Gastrointestinal: Negative for abdominal pain.  Endocrine: Negative for polydipsia and polyphagia.  Genitourinary: Negative for frequency.  Neurological: Negative for weakness.  Psychiatric/Behavioral: Negative for confusion.       Objective:   Physical Exam  Constitutional: She appears well-nourished. No distress.  Cardiovascular: Normal rate, regular rhythm and normal heart sounds.   No murmur heard. Pulmonary/Chest: Effort normal and breath sounds normal. No respiratory distress.  Musculoskeletal: She exhibits no edema.  Lymphadenopathy:    She has no cervical adenopathy.  Neurological: She is alert. She exhibits normal muscle tone.  Psychiatric: Her behavior is normal.  Vitals reviewed.         Assessment & Plan:  Hypothyroidism new dose as prescribed Recheck TSH in 8 weeks Continue statin lipid profile looks good Healthy eating regular physical activity discussed COPD with shortness of breath-extensive smoking history in the past. Try Spiriva over the next few months E that doesn't help with progressive troubles may need further lung function tests and chest x-ray etc.

## 2014-08-31 ENCOUNTER — Telehealth: Payer: Self-pay | Admitting: *Deleted

## 2014-08-31 NOTE — Telephone Encounter (Signed)
Pt called stating she has seen Dr. Wolfgang Phoenix and pt is requesting to speak with a nurse she is getting out of breath easy. Please advise [phone]

## 2014-09-04 ENCOUNTER — Ambulatory Visit (INDEPENDENT_AMBULATORY_CARE_PROVIDER_SITE_OTHER): Payer: BC Managed Care – PPO | Admitting: Family Medicine

## 2014-09-04 ENCOUNTER — Encounter: Payer: Self-pay | Admitting: Family Medicine

## 2014-09-04 ENCOUNTER — Telehealth: Payer: Self-pay | Admitting: *Deleted

## 2014-09-04 VITALS — BP 132/86 | Temp 98.7°F | Ht 60.0 in | Wt 157.0 lb

## 2014-09-04 DIAGNOSIS — J441 Chronic obstructive pulmonary disease with (acute) exacerbation: Secondary | ICD-10-CM

## 2014-09-04 MED ORDER — HYDROCODONE-HOMATROPINE 5-1.5 MG/5ML PO SYRP: ORAL_SOLUTION | ORAL | Status: DC

## 2014-09-04 MED ORDER — LEVOFLOXACIN 500 MG PO TABS: 500.0000 mg | ORAL_TABLET | Freq: Every day | ORAL | Status: DC

## 2014-09-04 MED ORDER — PREDNISONE 20 MG PO TABS: ORAL_TABLET | ORAL | Status: DC

## 2014-09-04 NOTE — Telephone Encounter (Signed)
Pt would like a permanent handicapp sticker. Please call when ready to pickup. [phone]

## 2014-09-04 NOTE — Progress Notes (Signed)
   Subjective:    Patient ID: [name], female    DOB: Dec 20, 1960, 53 y.o.   MRN: [id number]  Cough This is a new problem. The current episode started more than 1 month ago. Associated symptoms include wheezing. Associated symptoms comments: diarrhea. Treatments tried: neb treatments, antibiotic, prednisone.    Patient unfortunately smokes. Known history of COPD. Took round of antibiotics and prednisone. Wheezing has reemerged. Very significant at times.  Review of Systems  Respiratory: Positive for cough and wheezing.    No vomiting no diarrhea no rash    Objective:   Physical Exam  Alert vitals stable. HEENT moderate his congestion pharynx normal neck supple. Lungs impressive bilateral wheezes no tachypnea no crackles heart regular in rhythm.      Assessment & Plan:  Impression persistent flare of COPD. Discussed at length. Patient was given Spiriva but stopped this. Plans to resume after acute treatment. Plan repeat prednisone taper. Levaquin daily 10 days. Maintain albuterol. Resume Spiriva as plan WSL

## 2014-09-05 NOTE — Telephone Encounter (Signed)
The form was filled out

## 2014-09-29 ENCOUNTER — Encounter: Payer: Self-pay | Admitting: Nurse Practitioner

## 2014-09-29 ENCOUNTER — Ambulatory Visit (INDEPENDENT_AMBULATORY_CARE_PROVIDER_SITE_OTHER): Payer: 59 | Admitting: Nurse Practitioner

## 2014-09-29 ENCOUNTER — Ambulatory Visit (HOSPITAL_COMMUNITY)
Admission: RE | Admit: 2014-09-29 | Discharge: 2014-09-29 | Disposition: A | Discharge status: Home / Self Care | Payer: 59 | Source: Ambulatory Visit | Attending: Nurse Practitioner | Admitting: Nurse Practitioner

## 2014-09-29 VITALS — BP 100/70 | Temp 97.9°F | Ht 60.0 in | Wt 154.5 lb

## 2014-09-29 DIAGNOSIS — J441 Chronic obstructive pulmonary disease with (acute) exacerbation: Secondary | ICD-10-CM | POA: Diagnosis not present

## 2014-09-29 DIAGNOSIS — R0602 Shortness of breath: Secondary | ICD-10-CM | POA: Diagnosis not present

## 2014-09-29 DIAGNOSIS — R05 Cough: Secondary | ICD-10-CM | POA: Diagnosis present

## 2014-09-29 DIAGNOSIS — J449 Chronic obstructive pulmonary disease, unspecified: Secondary | ICD-10-CM

## 2014-09-29 DIAGNOSIS — J300 Vasomotor rhinitis: Secondary | ICD-10-CM

## 2014-09-29 MED ORDER — LEVOFLOXACIN 500 MG PO TABS: 500.0000 mg | ORAL_TABLET | Freq: Every day | ORAL | Status: DC

## 2014-09-29 MED ORDER — HYDROCODONE-HOMATROPINE 5-1.5 MG/5ML PO SYRP: 5.0000 mL | ORAL_SOLUTION | ORAL | Status: DC | PRN

## 2014-09-29 MED ORDER — PREDNISONE 20 MG PO TABS: ORAL_TABLET | ORAL | Status: DC

## 2014-10-03 ENCOUNTER — Encounter: Payer: Self-pay | Admitting: Nurse Practitioner

## 2014-10-03 NOTE — Progress Notes (Signed)
Subjective:  Presents for complaints of cough and congestion. Was last seen for this on 12/14. Was better while on antibiotics and prednisone. Patient went to the beach for a trip, symptoms came back. Wheezing. Using albuterol 3-4 times per day which helps. No fever. Mild headache. Producing large amount of clear sputum. No edema. Some shortness of breath. No sore throat or ear pain. Last neb treatment was about 2 hours ago. Had a recheck with her cardiologist on 11/2. Has not been taking her Spiriva on a regular basis for COPD.  Objective:   BP 100/70 mmHg  Temp(Src) 97.9 F (36.6 C) (Oral)  Ht 5' (1.524 m)  Wt 154 lb 8 oz (70.081 kg)  BMI 30.17 kg/m2  SpO2 91% NAD. Alert, oriented. TMs clear effusion, no erythema. Pharynx injected with PND noted. Neck supple with mild soft anterior adenopathy. Lungs breath sounds diminished overall, rare expiratory wheeze. No tachypnea. Normal color. Heart regular rate rhythm. Lower extremities no edema.  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 predniSONE (DELTASONE) 20 MG tablet    Sig: 3 po qd x 3 d then 2 po qd x 3 d then 1 po qd x 3 d    Dispense:  18 tablet    Refill:  0    Order Specific Question:  Supervising Provider    Answer:  Mikey Kirschner [[id number]]  . HYDROcodone-homatropine (HYCODAN) 5-1.5 MG/5ML syrup    Sig: Take 5 mLs by mouth every 4 (four) hours as needed.    Dispense:  120 mL    Refill:  0    Order Specific Question:  Supervising Provider    Answer:  Mikey Kirschner [[id number]]  . levofloxacin (LEVAQUIN) 500 MG tablet    Sig: Take 1  tablet (500 mg total) by mouth daily.    Dispense:  10 tablet    Refill:  0    Order Specific Question:  Supervising Provider    Answer:  Mikey Kirschner [[id number]]   OTC meds as directed for congestion and cough. Warning signs reviewed. Call back in 72 hours if no improvement, call or go to ED over the weekend if worse. Restart Spiriva daily as directed.

## 2014-10-19 ENCOUNTER — Other Ambulatory Visit: Payer: Self-pay

## 2014-10-19 ENCOUNTER — Telehealth: Payer: Self-pay | Admitting: Family Medicine

## 2014-10-19 MED ORDER — AZITHROMYCIN 250 MG PO TABS: ORAL_TABLET | ORAL | Status: DC

## 2014-10-19 MED ORDER — PREDNISONE 20 MG PO TABS: ORAL_TABLET | ORAL | Status: DC

## 2014-10-19 NOTE — Telephone Encounter (Signed)
Notified patient that per Dr. Nicki Reaper we sent in another round of the prednisone and zpak also. Patient verbalized understanding.

## 2014-10-19 NOTE — Telephone Encounter (Signed)
Patient needs another round of prednisone 20 mg called in cough got better than got worse again, Cant come in until after the first of feb. Due to insurance issues. Call into [facility]. Patient cell :[phone]

## 2014-11-01 ENCOUNTER — Telehealth: Payer: Self-pay | Admitting: Family Medicine

## 2014-11-01 DIAGNOSIS — I2510 Atherosclerotic heart disease of native coronary artery without angina pectoris: Secondary | ICD-10-CM

## 2014-11-01 NOTE — Telephone Encounter (Signed)
Patient needs referral put in to go to her heart specialist in [place] . She has been going for 6 yrs now and due to her insurance change ,she has to have referral from our office so she can make appointment for her follow up. Dr. Domenic Polite at [facility] in [place].

## 2014-11-01 NOTE — Telephone Encounter (Signed)
Please put in referral for her cardiologist REA: CAD

## 2014-11-01 NOTE — Addendum Note (Signed)
Addended by: Jesusita Oka on: 11/01/2014 09:49 AM   Modules accepted: Orders

## 2014-11-01 NOTE — Telephone Encounter (Signed)
Referral initiated in the system. Patient was notified.

## 2014-11-02 ENCOUNTER — Encounter: Payer: Self-pay | Admitting: Family Medicine

## 2014-11-02 LAB — TSH: TSH: 3.751 u[IU]/mL (ref 0.350–4.500)

## 2014-11-09 ENCOUNTER — Other Ambulatory Visit: Payer: Self-pay | Admitting: *Deleted

## 2014-11-09 MED ORDER — METOPROLOL TARTRATE 25 MG PO TABS: ORAL_TABLET | ORAL | Status: DC

## 2014-11-13 ENCOUNTER — Telehealth: Payer: Self-pay | Admitting: Family Medicine

## 2014-11-13 MED ORDER — ALBUTEROL SULFATE HFA 108 (90 BASE) MCG/ACT IN AERS: 2.0000 | INHALATION_SPRAY | Freq: Four times a day (QID) | RESPIRATORY_TRACT | Status: DC | PRN

## 2014-11-13 NOTE — Telephone Encounter (Signed)
Pt is wanting to know if she can be switched from proventil to ventolin. Pt's insurance requires her to pay too much for the proventil.

## 2014-11-13 NOTE — Telephone Encounter (Signed)
Send in script specific for Ventolin with 12 refills

## 2014-11-13 NOTE — Telephone Encounter (Signed)
Medication sent to pharmacy. Patient was notified.  

## 2014-11-15 ENCOUNTER — Encounter: Payer: Self-pay | Admitting: Family Medicine

## 2014-11-15 ENCOUNTER — Telehealth: Payer: Self-pay | Admitting: Family Medicine

## 2014-11-15 ENCOUNTER — Ambulatory Visit (INDEPENDENT_AMBULATORY_CARE_PROVIDER_SITE_OTHER): Payer: 59 | Admitting: Family Medicine

## 2014-11-15 VITALS — BP 122/84 | Temp 98.5°F | Ht 60.5 in | Wt 156.0 lb

## 2014-11-15 DIAGNOSIS — J441 Chronic obstructive pulmonary disease with (acute) exacerbation: Secondary | ICD-10-CM

## 2014-11-15 MED ORDER — BECLOMETHASONE DIPROPIONATE 80 MCG/ACT IN AERS: 2.0000 | INHALATION_SPRAY | Freq: Two times a day (BID) | RESPIRATORY_TRACT | Status: DC

## 2014-11-15 MED ORDER — PREDNISONE 20 MG PO TABS: ORAL_TABLET | ORAL | Status: DC

## 2014-11-15 MED ORDER — BUDESONIDE-FORMOTEROL FUMARATE 160-4.5 MCG/ACT IN AERO: 2.0000 | INHALATION_SPRAY | Freq: Two times a day (BID) | RESPIRATORY_TRACT | Status: DC

## 2014-11-15 MED ORDER — TIOTROPIUM BROMIDE MONOHYDRATE 18 MCG IN CAPS: 18.0000 ug | ORAL_CAPSULE | Freq: Every day | RESPIRATORY_TRACT | Status: DC

## 2014-11-15 MED ORDER — AZITHROMYCIN 250 MG PO TABS: ORAL_TABLET | ORAL | Status: DC

## 2014-11-15 NOTE — Telephone Encounter (Signed)
Patient says that she was given an Rx for symbicort today and was told to call back if her insurance would cover a generic.  Her insruance will cover qvar, alvesco, and asmanex which has different strengths of 7, 14, 30, 60, [place]

## 2014-11-15 NOTE — Telephone Encounter (Signed)
Notified patient although it is not exactly like Symbicort, Qvar 2 puffs used twice a day, rinse mouth after use, would be helpful and affordable. Cancel Symbicort. Tell the patient more than likely she will have to use the albuterol on a fairly frequent basis as needed for wheezing or shortness of breath. Patient verbalized understanding. Med sent to [facility].

## 2014-11-15 NOTE — Progress Notes (Signed)
   Subjective:    Patient ID: [name], female    DOB: [birth year], 54 y.o.   MRN: [id number]  Cough This is a recurrent problem. The current episode started in the past 7 days. Associated symptoms include headaches and wheezing. Associated symptoms comments: congestion. Treatments tried: clairitin, delsym, inhaler.   Patient has long smoking history. No hemoptysis. She does not smoke currently. She is using her inhaler as directed. States has difficult time affording medication.   Review of Systems  Respiratory: Positive for cough and wheezing.   Neurological: Positive for headaches.       Objective:   Physical Exam  Head and neck normal no masses eardrums normal throat normal Bilateral expiratory wheezes noted heart regular Extremities no edema    Assessment & Plan:  Reactive airway along with secondary infection refill antibiotic prednisone taper COPD not doing well on current medication cannot afford adding knee medicine side advised her to stop Spiriva and start Symbicort twice daily. She is to follow-up within a couple months time if not doing better by then the next step would be is additional testing

## 2014-11-15 NOTE — Telephone Encounter (Signed)
Although it is not exactly like Symbicort, Qvar 2 puffs used twice a day, rinse mouth after use, would be helpful and affordable. Cancel Symbicort. Send in Qvar 80 g inhaler. 4 refills. Tell the patient more than likely she will have to use the albuterol on a fairly frequent basis as needed for wheezing or shortness of breath.

## 2014-12-05 ENCOUNTER — Telehealth: Payer: Self-pay | Admitting: Family Medicine

## 2014-12-05 NOTE — Telephone Encounter (Signed)
tiotropium (SPIRIVA) 18 MCG inhalation capsule  Pt states the meds are too expensive, she found an alternative  She would like to use one of these two please   [place]   Incruse El[facility]  Please get with the pt about which you think she should use an send to  [facility] in [place]

## 2014-12-06 MED ORDER — ACLIDINIUM BROMIDE 400 MCG/ACT IN AEPB: INHALATION_SPRAY | RESPIRATORY_TRACT | Status: DC

## 2014-12-06 NOTE — Telephone Encounter (Signed)
May send in Laurie, 400 mcg bid as dry inhaler, 1 month, 6 refills, make changes in epic and notify pt plz

## 2014-12-06 NOTE — Telephone Encounter (Signed)
Notified patient via VM stating we sent in the medication

## 2015-01-26 ENCOUNTER — Ambulatory Visit (INDEPENDENT_AMBULATORY_CARE_PROVIDER_SITE_OTHER): Payer: 59 | Admitting: Family Medicine

## 2015-01-26 ENCOUNTER — Encounter: Payer: Self-pay | Admitting: Family Medicine

## 2015-01-26 VITALS — BP 118/68 | Ht 61.0 in | Wt 159.0 lb

## 2015-01-26 DIAGNOSIS — I10 Essential (primary) hypertension: Secondary | ICD-10-CM

## 2015-01-26 DIAGNOSIS — E782 Mixed hyperlipidemia: Secondary | ICD-10-CM

## 2015-01-26 DIAGNOSIS — E038 Other specified hypothyroidism: Secondary | ICD-10-CM | POA: Diagnosis not present

## 2015-01-26 MED ORDER — BECLOMETHASONE DIPROPIONATE 80 MCG/ACT IN AERS: 2.0000 | INHALATION_SPRAY | Freq: Two times a day (BID) | RESPIRATORY_TRACT | Status: DC

## 2015-01-26 MED ORDER — LEVOTHYROXINE SODIUM 112 MCG PO TABS: 112.0000 ug | ORAL_TABLET | Freq: Every day | ORAL | Status: DC

## 2015-01-26 NOTE — Patient Instructions (Signed)

## 2015-01-26 NOTE — Progress Notes (Signed)
   Subjective:    Patient ID: [name], female    DOB: June 07, 1961, 54 y.o.   MRN: [id number]  Hyperlipidemia This is a new problem. The current episode started more than 1 year ago. Pertinent negatives include no chest pain. Compliance problems include adherence to exercise.   pt eats healthy. Doesn't eat much red meat or fried foods.   Pt states no concerns today.   She denies chest tightness pressure pain shortness breath nausea vomiting diarrhea  Review of Systems  Constitutional: Negative for activity change, appetite change and fatigue.  HENT: Negative for congestion.   Respiratory: Negative for cough.   Cardiovascular: Negative for chest pain.  Gastrointestinal: Negative for abdominal pain.  Endocrine: Negative for polydipsia and polyphagia.  Neurological: Negative for weakness.  Psychiatric/Behavioral: Negative for confusion.       Objective:   Physical Exam  Constitutional: She appears well-nourished. No distress.  Cardiovascular: Normal rate, regular rhythm and normal heart sounds.   No murmur heard. Pulmonary/Chest: Effort normal and breath sounds normal. No respiratory distress.  Musculoskeletal: She exhibits no edema.  Lymphadenopathy:    She has no cervical adenopathy.  Neurological: She is alert. She exhibits normal muscle tone.  Psychiatric: Her behavior is normal.  Vitals reviewed.         Assessment & Plan:  Hyperlipidemia continue current medication check lab work await results  Continue medication in regards to thyroid monitor her levels  Blood pressure good control continue current measures patient will check lab work toward the end of May

## 2015-02-09 ENCOUNTER — Encounter: Payer: Self-pay | Admitting: Family Medicine

## 2015-02-09 LAB — BASIC METABOLIC PANEL
BUN/Creatinine Ratio: 11 (ref 9–23)
BUN: 10 mg/dL (ref 6–24)
CALCIUM: 9.1 mg/dL (ref 8.7–10.2)
CO2: 25 mmol/L (ref 18–29)
CREATININE: 0.87 mg/dL (ref 0.57–1.00)
Chloride: 103 mmol/L (ref 97–108)
GFR calc Af Amer: 86 mL/min/{1.73_m2} (ref >59)
GFR, EST NON AFRICAN AMERICAN: 75 mL/min/{1.73_m2} (ref >59)
GLUCOSE: 91 mg/dL (ref 65–99)
Potassium: 4.5 mmol/L (ref 3.5–5.2)
Sodium: 142 mmol/L (ref 134–144)

## 2015-02-09 LAB — LIPID PANEL
CHOLESTEROL TOTAL: 157 mg/dL (ref 100–199)
Chol/HDL Ratio: 2.5 ratio units (ref 0.0–4.4)
HDL: 62 mg/dL (ref >39)
LDL Calculated: 63 mg/dL (ref 0–99)
Triglycerides: 158 mg/dL — ABNORMAL HIGH (ref 0–149)
VLDL CHOLESTEROL CAL: 32 mg/dL (ref 5–40)

## 2015-02-09 LAB — TSH: TSH: 1.82 u[IU]/mL (ref 0.450–4.500)

## 2015-02-09 LAB — HEPATIC FUNCTION PANEL
ALT: 17 IU/L (ref 0–32)
AST: 20 IU/L (ref 0–40)
Albumin: 4.6 g/dL (ref 3.5–5.5)
Alkaline Phosphatase: 57 IU/L (ref 39–117)
BILIRUBIN, DIRECT: 0.12 mg/dL (ref 0.00–0.40)
Bilirubin Total: 0.5 mg/dL (ref 0.0–1.2)
Total Protein: 6.8 g/dL (ref 6.0–8.5)

## 2015-02-20 ENCOUNTER — Ambulatory Visit: Payer: 59 | Admitting: Cardiology

## 2015-02-21 ENCOUNTER — Ambulatory Visit (INDEPENDENT_AMBULATORY_CARE_PROVIDER_SITE_OTHER): Payer: 59 | Admitting: Cardiology

## 2015-02-21 ENCOUNTER — Encounter: Payer: Self-pay | Admitting: Cardiology

## 2015-02-21 VITALS — BP 109/70 | HR 68 | Ht 61.0 in | Wt 163.8 lb

## 2015-02-21 DIAGNOSIS — I10 Essential (primary) hypertension: Secondary | ICD-10-CM | POA: Diagnosis not present

## 2015-02-21 DIAGNOSIS — I2510 Atherosclerotic heart disease of native coronary artery without angina pectoris: Secondary | ICD-10-CM | POA: Diagnosis not present

## 2015-02-21 DIAGNOSIS — E782 Mixed hyperlipidemia: Secondary | ICD-10-CM

## 2015-02-21 NOTE — Progress Notes (Signed)
Cardiology Office Note  Date: 02/21/2015   ID: [name], DOB [birth year], MRN [id number]  PCP: Sallee Lange, MD  Primary Cardiologist: Rozann Lesches, MD   Chief Complaint  Patient presents with  . Coronary Artery Disease  . Hyperlipidemia    History of Present Illness: [name] is a 54 y.o. female last seen in November 2015. She presents for a routine follow-up visit. Other than having a prolonged respiratory illness exacerbating COPD over the winter months, she reports no other major health changes. She has had no angina symptoms on medical therapy. Follow-up ischemic testing from November of last year is outlined below. She has not required any nitroglycerin.  She continues to follow regularly with Dr. Wolfgang Phoenix. Recent lab work reviewed showing good lipid control on Zocor.   Past Medical History  Diagnosis Date  . Hypothyroidism   . Coronary atherosclerosis of native coronary artery     DES RCA 5/09, nonobstructive left system  . Mixed hyperlipidemia   . Essential hypertension, benign   . NSTEMI (non-ST elevated myocardial infarction)     2009     Current Outpatient Prescriptions  Medication Sig Dispense Refill  . Aclidinium Bromide 400 MCG/ACT AEPB 400 mcg BID daily 1 each 5  . albuterol (PROVENTIL) (2.5 MG/3ML) 0.083% nebulizer solution Take 3 mLs (2.5 mg total) by nebulization every 4 (four) hours as needed for wheezing. 75 mL 12  . albuterol (VENTOLIN HFA) 108 (90 BASE) MCG/ACT inhaler Inhale 2 puffs into the lungs every 6 (six) hours as needed for wheezing or shortness of breath. 18 g 11  . aspirin 81 MG tablet Take 81 mg by mouth daily.      . beclomethasone (QVAR) 80 MCG/ACT inhaler Inhale 2 puffs into the lungs 2 (two) times daily. Rinse mouth after use (Patient taking differently: Inhale 2 puffs into the lungs 2 (two) times daily as needed. Rinse mouth after use) 3 Inhaler 3  . levothyroxine (SYNTHROID, LEVOTHROID) 112 MCG tablet Take 1 tablet (112  mcg total) by mouth daily. 90 tablet 1  . metoprolol tartrate (LOPRESSOR) 25 MG tablet TAKE ONE-HALF TABLET BY MOUTH TWICE DAILY 30 tablet 6  . nitroGLYCERIN (NITROSTAT) 0.4 MG SL tablet Place 0.4 mg under the tongue every 5 (five) minutes as needed.      . simvastatin (ZOCOR) 40 MG tablet Take 1 tablet (40 mg total) by mouth at bedtime. 90 tablet 3   No current facility-administered medications for this visit.    Allergies:  Review of patient's allergies indicates no known allergies.   Social History: The patient  reports that she quit smoking about 10 years ago. Her smoking use included Cigarettes. She started smoking about 42 years ago. She has never used smokeless tobacco. She reports that she does not drink alcohol or use illicit drugs.    ROS:  Please see the history of present illness. Otherwise, complete review of systems is positive for none.  All other systems are reviewed and negative.   Physical Exam: VS:  BP 109/70 mmHg  Pulse 68  Ht 5\' 1"  (1.549 m)  Wt 163 lb 12.8 oz (74.299 kg)  BMI 30.97 kg/m2  SpO2 97%, BMI Body mass index is 30.97 kg/(m^2).  Wt Readings from Last 3 Encounters:  02/21/15 163 lb 12.8 oz (74.299 kg)  01/26/15 159 lb (72.122 kg)  11/15/14 156 lb (70.761 kg)     Appears comfortable at rest.  HEENT: Conjunctiva and lids normal, oropharynx clear.  Neck: Supple,  no elevated JVP or carotid bruits, no thyromegaly.  Lungs: Diminished but clear to auscultation, nonlabored breathing at rest.  Cardiac: Regular rate and rhythm, no S3 or significant systolic murmur, no pericardial rub.  Abdomen: Soft, nontender, bowel sounds present, no guarding or rebound.  Extremities: No pitting edema, distal pulses 2+.  Skin: Warm and dry.  Musculoskeletal: No kyphosis.  Neuropsychiatric: Alert and oriented x3, affect grossly appropriate.   ECG: ECG is not ordered today.   Recent Labwork: 02/08/2015: ALT 17; AST 20; BUN 10; Creatinine 0.87; Potassium 4.5;  Sodium 142; TSH 1.820     Component Value Date/Time   CHOL 157 02/08/2015 0906   CHOL 133 08/03/2014 0725   TRIG 158* 02/08/2015 0906   HDL 62 02/08/2015 0906   HDL 45 08/03/2014 0725   CHOLHDL 2.5 02/08/2015 0906   CHOLHDL 3.0 08/03/2014 0725   VLDL 24 08/03/2014 0725   LDLCALC 63 02/08/2015 0906   LDLCALC 64 08/03/2014 0725    Other Studies Reviewed Today:  Lexiscan Cardiolite 08/03/2014: FINDINGS: Baseline tracing shows sinus rhythm at 68 beats per min. Lexiscan bolus was given in standard fashion. Heart rate increased from 61 beats per min up to 89 beats per min, and blood pressure increased from 110/67 up to 118/71. There were no diagnostic ST segment changes, and no arrhythmias were noted.  Analysis of the overall perfusion data shows adequate radiotracer uptake within the myocardium.  Perfusion: There is a very small, fixed inferoapical defect that is suggestive of soft tissue attenuation. No large ischemic zones noted.  Wall Motion: Normal left ventricular wall motion. No left ventricular dilation.  Left Ventricular Ejection Fraction: 67 %  End diastolic volume 65 ml  End systolic volume 21 ml  IMPRESSION: 1. Normal study. Soft tissue attenuation noted without clear evidence of scar or ischemia.  2. Normal left ventricular wall motion.  3. Left ventricular ejection fraction 67%  4. Low-risk stress test findings*.   Assessment and Plan:  1. Symptomatic stable CAD status post DES to the RCA with negative ischemic testing done in follow-up last November. Continue observation.  2. Hyperlipidemia, well controlled on Zocor. Recent LDL 64.  3. Essential hypertension, blood pressure normal today.  Current medicines were reviewed with the patient today.  Disposition: FU with me in 6 months.   Signed, Satira Sark, MD, [facility] 02/21/2015 3:16 PM    [facility] [facility] [place], [place], [place] [address] Phone:  [phone]; Fax: [phone]

## 2015-02-21 NOTE — Patient Instructions (Signed)
Your physician recommends that you continue on your current medications as directed. Please refer to the Current Medication list given to you today. Your physician recommends that you schedule a follow-up appointment in: 6 months. You will receive a reminder letter in the mail in about 4 months reminding you to call and schedule your appointment. If you don't receive this letter, please contact our office. 

## 2015-03-21 ENCOUNTER — Other Ambulatory Visit: Payer: Self-pay | Admitting: *Deleted

## 2015-03-21 MED ORDER — SIMVASTATIN 40 MG PO TABS: 40.0000 mg | ORAL_TABLET | Freq: Every day | ORAL | Status: DC

## 2015-06-11 ENCOUNTER — Other Ambulatory Visit: Payer: Self-pay | Admitting: Cardiology

## 2015-06-11 MED ORDER — METOPROLOL TARTRATE 25 MG PO TABS: ORAL_TABLET | ORAL | Status: DC

## 2015-06-11 NOTE — Telephone Encounter (Signed)
Done

## 2015-06-11 NOTE — Telephone Encounter (Signed)
Needs refill metoprolol tartrate (LOPRESSOR) 25 MG tablet Patient is requesting to see if a 90 day supply can be called.  [employer]

## 2015-06-13 ENCOUNTER — Other Ambulatory Visit: Payer: Self-pay | Admitting: Family Medicine

## 2015-07-09 ENCOUNTER — Ambulatory Visit (INDEPENDENT_AMBULATORY_CARE_PROVIDER_SITE_OTHER): Payer: 59 | Admitting: Family Medicine

## 2015-07-09 ENCOUNTER — Encounter: Payer: Self-pay | Admitting: Family Medicine

## 2015-07-09 VITALS — BP 108/64 | Ht 61.0 in | Wt 157.2 lb

## 2015-07-09 DIAGNOSIS — E038 Other specified hypothyroidism: Secondary | ICD-10-CM

## 2015-07-09 DIAGNOSIS — E782 Mixed hyperlipidemia: Secondary | ICD-10-CM

## 2015-07-09 DIAGNOSIS — Z23 Encounter for immunization: Secondary | ICD-10-CM | POA: Diagnosis not present

## 2015-07-09 DIAGNOSIS — J449 Chronic obstructive pulmonary disease, unspecified: Secondary | ICD-10-CM | POA: Diagnosis not present

## 2015-07-09 MED ORDER — LEVOTHYROXINE SODIUM 112 MCG PO TABS: 112.0000 ug | ORAL_TABLET | Freq: Every day | ORAL | Status: DC

## 2015-07-09 MED ORDER — METOPROLOL TARTRATE 25 MG PO TABS: ORAL_TABLET | ORAL | Status: DC

## 2015-07-09 MED ORDER — SIMVASTATIN 40 MG PO TABS: 40.0000 mg | ORAL_TABLET | Freq: Every day | ORAL | Status: DC

## 2015-07-09 NOTE — Progress Notes (Signed)
   Subjective:    Patient ID: [name], female    DOB: [birth year], 54 y.o.   MRN: [id number]  Hypertension This is a chronic problem. The problem has been gradually improving since onset. Associated symptoms include shortness of breath. Pertinent negatives include no chest pain. There are no associated agents to hypertension. There are no known risk factors for coronary artery disease. Treatments tried: metoprolol. The current treatment provides moderate improvement. There are no compliance problems.    Patient states that she has no concerns at this time.  Patient does watch her diet. Last lab work showed cholesterol is good. She is taken her medicines tolerating well without pain or discomfort She states her energy level doing good taking her thyroid medicine on a regular basis. Last lab work looked good Patient denies any chest tightness pressure pain saw cardiologist earlier this year. Tolerating metoprolol well. Patient has quit smoking he has severe trouble with COPD. Does not use her medicine on a consistent basis believe it is mainly because he does not understand see me. Time taken today to discuss this. She denies fever sweats chills.  Review of Systems  Constitutional: Negative for activity change, appetite change and fatigue.  HENT: Negative for congestion.   Respiratory: Positive for shortness of breath and wheezing. Negative for cough.   Cardiovascular: Negative for chest pain.  Gastrointestinal: Negative for abdominal pain.  Endocrine: Negative for polydipsia and polyphagia.  Neurological: Negative for weakness.  Psychiatric/Behavioral: Negative for confusion.       Objective:   Physical Exam  Constitutional: She appears well-nourished. No distress.  Cardiovascular: Normal rate, regular rhythm and normal heart sounds.   No murmur heard. Pulmonary/Chest: Effort normal and breath sounds normal. No respiratory distress.  Musculoskeletal: She exhibits no edema.    Lymphadenopathy:    She has no cervical adenopathy.  Neurological: She is alert. She exhibits normal muscle tone.  Psychiatric: Her behavior is normal.  Vitals reviewed.         Assessment & Plan:  Hypothyroidism doing well on current medication energy doing good continue medication as is recheck labs in 6 months Hyperlipidemia-continue current medication. Watch diet closely COPDtime spent with patient discussing the pathophysiology of COPD in the treatment of it. Patient understands better. She will start using her Qvar on a daily basis she had been using it intermittently. Albuterol when necessary. Continue her other medicine as well. History blood pressure heart disease stable currently 25 minutes was spent with the patient. Greater than half the time was spent in discussion and answering questions and counseling regarding the issues that the patient came in for today.

## 2015-08-14 ENCOUNTER — Ambulatory Visit (INDEPENDENT_AMBULATORY_CARE_PROVIDER_SITE_OTHER): Payer: 59 | Admitting: Cardiology

## 2015-08-14 ENCOUNTER — Encounter: Payer: Self-pay | Admitting: Cardiology

## 2015-08-14 VITALS — BP 138/88 | HR 65 | Ht 61.0 in | Wt 157.0 lb

## 2015-08-14 DIAGNOSIS — E782 Mixed hyperlipidemia: Secondary | ICD-10-CM

## 2015-08-14 DIAGNOSIS — I2510 Atherosclerotic heart disease of native coronary artery without angina pectoris: Secondary | ICD-10-CM

## 2015-08-14 DIAGNOSIS — I10 Essential (primary) hypertension: Secondary | ICD-10-CM

## 2015-08-14 MED ORDER — NITROGLYCERIN 0.4 MG SL SUBL: 0.4000 mg | SUBLINGUAL_TABLET | SUBLINGUAL | Status: DC | PRN

## 2015-08-14 NOTE — Progress Notes (Signed)
Cardiology Office Note  Date: 08/14/2015   ID: [name], DOB July 31, 1961, MRN [id number]  PCP: Sallee Lange, MD  Primary Cardiologist: Rozann Lesches, MD   Chief Complaint  Patient presents with  . Coronary Artery Disease    History of Present Illness: [name] is a 54 y.o. female last seen in June. She presents for a routine follow-up visit. States that she has used nitroglycerin on 2 occasions within the last year. No escalating angina pattern, stable NYHA class II dyspnea.  We reviewed her medications which are outlined below. No change from a cardiac perspective, she continues on aspirin, Lopressor, Zocor, and has nitroglycerin available.  ECG today shows sinus rhythm with nonspecific T-wave changes. She had a follow-up Cardiolite study in November of last year which was low risk.  She continues on Zocor with LDL 63 in May.   Past Medical History  Diagnosis Date  . Hypothyroidism   . Coronary atherosclerosis of native coronary artery     DES RCA 5/09, nonobstructive left system  . Mixed hyperlipidemia   . Essential hypertension, benign   . NSTEMI (non-ST elevated myocardial infarction) [facility])     2009    Current Outpatient Prescriptions  Medication Sig Dispense Refill  . albuterol (PROVENTIL) (2.5 MG/3ML) 0.083% nebulizer solution Take 3 mLs (2.5 mg total) by nebulization every 4 (four) hours as needed for wheezing. 75 mL 12  . albuterol (VENTOLIN HFA) 108 (90 BASE) MCG/ACT inhaler Inhale 2 puffs into the lungs every 6 (six) hours as needed for wheezing or shortness of breath. 18 g 11  . aspirin 81 MG tablet Take 81 mg by mouth daily.      . beclomethasone (QVAR) 80 MCG/ACT inhaler Inhale 2 puffs into the lungs 2 (two) times daily. Rinse mouth after use (Patient taking differently: Inhale 2 puffs into the lungs 2 (two) times daily as needed. Rinse mouth after use) 3 Inhaler 3  . levothyroxine (SYNTHROID, LEVOTHROID) 112 MCG tablet Take 1 tablet (112 mcg  total) by mouth daily. 90 tablet 1  . metoprolol tartrate (LOPRESSOR) 25 MG tablet TAKE ONE-HALF TABLET BY MOUTH TWICE DAILY 90 tablet 1  . nitroGLYCERIN (NITROSTAT) 0.4 MG SL tablet Place 0.4 mg under the tongue every 5 (five) minutes as needed.      . simvastatin (ZOCOR) 40 MG tablet Take 1 tablet (40 mg total) by mouth at bedtime. 90 tablet 1  . TUDORZA PRESSAIR 400 MCG/ACT AEPB INHALE ONE PUFF BY MOUTH TWICE DAILY 1 each 3   No current facility-administered medications for this visit.    Allergies:  Review of patient's allergies indicates no known allergies.   Social History: The patient  reports that she quit smoking about 10 years ago. Her smoking use included Cigarettes. She started smoking about 42 years ago. She has never used smokeless tobacco. She reports that she does not drink alcohol or use illicit drugs.   ROS:  Please see the history of present illness. Otherwise, complete review of systems is positive for none.  All other systems are reviewed and negative.   Physical Exam: VS:  BP 138/88 mmHg  Pulse 65  Ht 5\' 1"  (1.549 m)  Wt 157 lb (71.215 kg)  BMI 29.68 kg/m2  SpO2 95%, BMI Body mass index is 29.68 kg/(m^2).  Wt Readings from Last 3 Encounters:  08/14/15 157 lb (71.215 kg)  07/09/15 157 lb 4 oz (71.328 kg)  02/21/15 163 lb 12.8 oz (74.299 kg)  Appears comfortable at rest.  HEENT: Conjunctiva and lids normal, oropharynx clear.  Neck: Supple, no elevated JVP or carotid bruits, no thyromegaly.  Lungs: Diminished but clear to auscultation, nonlabored breathing at rest.  Cardiac: Regular rate and rhythm, no S3 or significant systolic murmur, no pericardial rub.  Abdomen: Soft, nontender, bowel sounds present, no guarding or rebound.  Extremities: No pitting edema, distal pulses 2+.    ECG: ECG is ordered today.   Recent Labwork: 02/08/2015: ALT 17; AST 20; BUN 10; Creatinine, Ser 0.87; Potassium 4.5; Sodium 142; TSH 1.820     Component Value  Date/Time   CHOL 157 02/08/2015 0906   CHOL 133 08/03/2014 0725   TRIG 158* 02/08/2015 0906   HDL 62 02/08/2015 0906   HDL 45 08/03/2014 0725   CHOLHDL 2.5 02/08/2015 0906   CHOLHDL 3.0 08/03/2014 0725   VLDL 24 08/03/2014 0725   LDLCALC 63 02/08/2015 0906   LDLCALC 64 08/03/2014 0725    Other Studies Reviewed Today:  Lexiscan Cardiolite 08/03/2014: FINDINGS: Baseline tracing shows sinus rhythm at 68 beats per min. Lexiscan bolus was given in standard fashion. Heart rate increased from 61 beats per min up to 89 beats per min, and blood pressure increased from 110/67 up to 118/71. There were no diagnostic ST segment changes, and no arrhythmias were noted.  Analysis of the overall perfusion data shows adequate radiotracer uptake within the myocardium.  Perfusion: There is a very small, fixed inferoapical defect that is suggestive of soft tissue attenuation. No large ischemic zones noted.  Wall Motion: Normal left ventricular wall motion. No left ventricular dilation.  Left Ventricular Ejection Fraction: 67 %  End diastolic volume 65 ml  End systolic volume 21 ml  IMPRESSION: 1. Normal study. Soft tissue attenuation noted without clear evidence of scar or ischemia.  2. Normal left ventricular wall motion.  3. Left ventricular ejection fraction 67%  4. Low-risk stress test findings*.  Assessment and Plan:  1. 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 Follow-up Cardiolite from last November was low risk. Plan is to continue medical therapy and observation.  2. Essential hypertension, no change to current regimen.  3. Hyperlipidemia, on Zocor, with good lipid control.  Current medicines were reviewed with the patient today.   Orders Placed This Encounter  Procedures  . EKG 12-Lead    Disposition: FU with me in 6 months.   Signed, Satira Sark, MD, [facility] 08/14/2015 11:45 AM    [facility] [place], [place], [place] [address] Phone: [phone]; Fax: [phone]

## 2015-08-14 NOTE — Patient Instructions (Signed)
Your physician recommends that you continue on your current medications as directed. Please refer to the Current Medication list given to you today. Your physician recommends that you schedule a follow-up appointment in: 6 months. You will receive a reminder letter in the mail in about 4 months reminding you to call and schedule your appointment. If you don't receive this letter, please contact our office. 

## 2015-08-22 ENCOUNTER — Encounter: Payer: Self-pay | Admitting: Family Medicine

## 2015-08-28 ENCOUNTER — Ambulatory Visit (INDEPENDENT_AMBULATORY_CARE_PROVIDER_SITE_OTHER): Payer: 59

## 2015-08-28 DIAGNOSIS — Z23 Encounter for immunization: Secondary | ICD-10-CM | POA: Diagnosis not present

## 2015-10-29 ENCOUNTER — Other Ambulatory Visit: Payer: Self-pay | Admitting: Family Medicine

## 2015-11-05 ENCOUNTER — Telehealth: Payer: Self-pay | Admitting: Family Medicine

## 2015-11-05 NOTE — Telephone Encounter (Signed)
Rx prior auth for pt's TUDORZA PRESSAIR 400 MCG/ACT AEPB was DENIED, please see denial letter in red folder, please advise

## 2015-11-06 ENCOUNTER — Other Ambulatory Visit: Payer: Self-pay | Admitting: *Deleted

## 2015-11-06 MED ORDER — UMECLIDINIUM BROMIDE 62.5 MCG/INH IN AEPB: 1.0000 | INHALATION_SPRAY | Freq: Every day | RESPIRATORY_TRACT | Status: DC

## 2015-11-06 NOTE — Telephone Encounter (Signed)
Discussed with pt. Med sent to pharm.  

## 2015-11-06 NOTE — Telephone Encounter (Signed)
Her regular anti-cholinergic inhaler  [name] was denied because it is not a preferred medication by her insurance plan. Spiriva and Incruse Ellipta are medications that are covered that work the same way. This particular medication is a anti cholinergic inhaler. It is not covered by her insurance. There are other inhalers that work in the same way that are covered. Incruse Ellipta is also a anti-cholinergic medication it's one inhalation once a on a regular basis, may call in one with 12 refills after talking with patient please, if any problems let me know

## 2015-11-06 NOTE — Telephone Encounter (Signed)
[facility] 11/06/15

## 2015-11-19 ENCOUNTER — Other Ambulatory Visit: Payer: Self-pay | Admitting: Family Medicine

## 2015-12-01 ENCOUNTER — Other Ambulatory Visit: Payer: Self-pay | Admitting: Family Medicine

## 2015-12-28 ENCOUNTER — Encounter: Payer: Self-pay | Admitting: Family Medicine

## 2015-12-28 ENCOUNTER — Ambulatory Visit (INDEPENDENT_AMBULATORY_CARE_PROVIDER_SITE_OTHER): Payer: BLUE CROSS/BLUE SHIELD | Admitting: Family Medicine

## 2015-12-28 VITALS — BP 98/70 | Temp 98.3°F | Ht 61.0 in | Wt 156.5 lb

## 2015-12-28 DIAGNOSIS — E038 Other specified hypothyroidism: Secondary | ICD-10-CM

## 2015-12-28 DIAGNOSIS — J209 Acute bronchitis, unspecified: Secondary | ICD-10-CM

## 2015-12-28 DIAGNOSIS — J309 Allergic rhinitis, unspecified: Secondary | ICD-10-CM | POA: Diagnosis not present

## 2015-12-28 DIAGNOSIS — E782 Mixed hyperlipidemia: Secondary | ICD-10-CM

## 2015-12-28 DIAGNOSIS — T148 Other injury of unspecified body region: Secondary | ICD-10-CM | POA: Diagnosis not present

## 2015-12-28 DIAGNOSIS — T148XXA Other injury of unspecified body region, initial encounter: Secondary | ICD-10-CM

## 2015-12-28 DIAGNOSIS — J441 Chronic obstructive pulmonary disease with (acute) exacerbation: Secondary | ICD-10-CM | POA: Diagnosis not present

## 2015-12-28 MED ORDER — AMOXICILLIN-POT CLAVULANATE 875-125 MG PO TABS: 1.0000 | ORAL_TABLET | Freq: Two times a day (BID) | ORAL | Status: DC

## 2015-12-28 MED ORDER — PREDNISONE 20 MG PO TABS: ORAL_TABLET | ORAL | Status: DC

## 2015-12-28 NOTE — Progress Notes (Addendum)
   Subjective:    Patient ID: [name], female    DOB: April 07, 1961, 55 y.o.   MRN: [id number]  Cough This is a new problem. The current episode started in the past 7 days. The problem has been unchanged. Associated symptoms include chills, myalgias, rhinorrhea, shortness of breath and wheezing. Pertinent negatives include no chest pain, ear pain or fever. Nothing aggravates the symptoms. She has tried OTC cough suppressant for the symptoms. The treatment provided no relief.   Patient states that she no other concerns at this time.  Patient with approximately 1 week worth of head congestion drainage chest congestion coughing wheezing denies high fever chills nausea or vomiting  Review of Systems  Constitutional: Positive for chills and fatigue. Negative for fever and activity change.  HENT: Positive for congestion and rhinorrhea. Negative for ear pain.   Eyes: Negative for discharge.  Respiratory: Positive for cough, shortness of breath and wheezing.   Cardiovascular: Negative for chest pain.  Musculoskeletal: Positive for myalgias and back pain.       Objective:   Physical Exam  Constitutional: She appears well-developed.  HENT:  Head: Normocephalic.  Nose: Nose normal.  Mouth/Throat: Oropharynx is clear and moist. No oropharyngeal exudate.  Neck: Neck supple.  Cardiovascular: Normal rate and normal heart sounds.   No murmur heard. Pulmonary/Chest: Effort normal and breath sounds normal. She has no wheezes.  Lymphadenopathy:    She has no cervical adenopathy.  Skin: Skin is warm and dry.  Nursing note and vitals reviewed.   Patient has bruising on her left arm. She states that she may have bumped into something she doesn't really recall.      Assessment & Plan:  Viral illness Secondary bronchitis COPD exacerbation Antibiotics inhalers prednisone recommended-warning signs were discussed follow-up sooner problems Patient to follow-up in several weeks to review lab  work for comprehensive medication checkup We will check CBC.

## 2016-01-07 ENCOUNTER — Ambulatory Visit: Payer: 59 | Admitting: Family Medicine

## 2016-01-09 LAB — CBC WITH DIFFERENTIAL/PLATELET
BASOS: 0 %
Basophils Absolute: 0 10*3/uL (ref 0.0–0.2)
EOS (ABSOLUTE): 0.1 10*3/uL (ref 0.0–0.4)
EOS: 1 %
HEMATOCRIT: 44 % (ref 34.0–46.6)
Hemoglobin: 14.6 g/dL (ref 11.1–15.9)
IMMATURE GRANS (ABS): 0.1 10*3/uL (ref 0.0–0.1)
Immature Granulocytes: 2 %
LYMPHS: 43 %
Lymphocytes Absolute: 3.5 10*3/uL — ABNORMAL HIGH (ref 0.7–3.1)
MCH: 30.7 pg (ref 26.6–33.0)
MCHC: 33.2 g/dL (ref 31.5–35.7)
MCV: 93 fL (ref 79–97)
MONOS ABS: 0.8 10*3/uL (ref 0.1–0.9)
Monocytes: 9 %
NEUTROS ABS: 3.7 10*3/uL (ref 1.4–7.0)
Neutrophils: 45 %
PLATELETS: 308 10*3/uL (ref 150–379)
RBC: 4.75 x10E6/uL (ref 3.77–5.28)
RDW: 13.7 % (ref 12.3–15.4)
WBC: 8.3 10*3/uL (ref 3.4–10.8)

## 2016-01-09 LAB — BASIC METABOLIC PANEL
BUN/Creatinine Ratio: 17 (ref 9–23)
BUN: 15 mg/dL (ref 6–24)
CO2: 25 mmol/L (ref 18–29)
CREATININE: 0.89 mg/dL (ref 0.57–1.00)
Calcium: 9.1 mg/dL (ref 8.7–10.2)
Chloride: 101 mmol/L (ref 96–106)
GFR, EST AFRICAN AMERICAN: 83 mL/min/{1.73_m2} (ref >59)
GFR, EST NON AFRICAN AMERICAN: 72 mL/min/{1.73_m2} (ref >59)
Glucose: 84 mg/dL (ref 65–99)
POTASSIUM: 4.4 mmol/L (ref 3.5–5.2)
SODIUM: 142 mmol/L (ref 134–144)

## 2016-01-09 LAB — TSH: TSH: 5.09 u[IU]/mL — ABNORMAL HIGH (ref 0.450–4.500)

## 2016-01-09 LAB — HEPATIC FUNCTION PANEL
ALBUMIN: 4.2 g/dL (ref 3.5–5.5)
ALK PHOS: 56 IU/L (ref 39–117)
ALT: 21 IU/L (ref 0–32)
AST: 18 IU/L (ref 0–40)
BILIRUBIN TOTAL: 0.6 mg/dL (ref 0.0–1.2)
Bilirubin, Direct: 0.16 mg/dL (ref 0.00–0.40)
Total Protein: 6.4 g/dL (ref 6.0–8.5)

## 2016-01-09 LAB — LIPID PANEL
CHOLESTEROL TOTAL: 155 mg/dL (ref 100–199)
Chol/HDL Ratio: 2.9 ratio units (ref 0.0–4.4)
HDL: 53 mg/dL (ref >39)
LDL CALC: 63 mg/dL (ref 0–99)
TRIGLYCERIDES: 193 mg/dL — AB (ref 0–149)
VLDL CHOLESTEROL CAL: 39 mg/dL (ref 5–40)

## 2016-01-14 ENCOUNTER — Ambulatory Visit: Payer: Self-pay | Admitting: Family Medicine

## 2016-01-21 ENCOUNTER — Encounter: Payer: Self-pay | Admitting: Family Medicine

## 2016-01-21 ENCOUNTER — Ambulatory Visit (INDEPENDENT_AMBULATORY_CARE_PROVIDER_SITE_OTHER): Payer: BLUE CROSS/BLUE SHIELD | Admitting: Family Medicine

## 2016-01-21 VITALS — BP 118/80 | Ht 61.0 in | Wt 157.5 lb

## 2016-01-21 DIAGNOSIS — I10 Essential (primary) hypertension: Secondary | ICD-10-CM

## 2016-01-21 DIAGNOSIS — E038 Other specified hypothyroidism: Secondary | ICD-10-CM

## 2016-01-21 DIAGNOSIS — E782 Mixed hyperlipidemia: Secondary | ICD-10-CM

## 2016-01-21 MED ORDER — LEVOTHYROXINE SODIUM 125 MCG PO TABS: 125.0000 ug | ORAL_TABLET | Freq: Every day | ORAL | Status: DC

## 2016-01-21 MED ORDER — SIMVASTATIN 40 MG PO TABS: 40.0000 mg | ORAL_TABLET | Freq: Every day | ORAL | Status: DC

## 2016-01-21 MED ORDER — BECLOMETHASONE DIPROPIONATE 80 MCG/ACT IN AERS: 2.0000 | INHALATION_SPRAY | Freq: Two times a day (BID) | RESPIRATORY_TRACT | Status: DC

## 2016-01-21 MED ORDER — METOPROLOL TARTRATE 25 MG PO TABS: ORAL_TABLET | ORAL | Status: DC

## 2016-01-21 NOTE — Progress Notes (Signed)
   Subjective:    Patient ID: [name], female    DOB: 17-Oct-1960, 55 y.o.   MRN: [id number]  Hyperlipidemia This is a chronic problem. The current episode started more than 1 year ago. There are no known factors aggravating her hyperlipidemia. Current antihyperlipidemic treatment includes statins. The current treatment provides moderate improvement of lipids. There are no compliance problems.  There are no known risk factors for coronary artery disease.   Patient states that she has no concerns at this time.  Patient takes her cholesterol medicine on a regular basis Patient does take her medication as directed Takes her thyroid medicine on a regular basis Takes 81 mg aspirin Her COPD is been under decent control recently Does not smoke anymore Denies any chest tightness pressure pain shortness breath nausea vomiting diarrhea headaches fever chills   Review of Systems    see above Objective:   Physical Exam  Constitutional: She appears well-nourished. No distress.  Cardiovascular: Normal rate, regular rhythm and normal heart sounds.   No murmur heard. Pulmonary/Chest: Effort normal and breath sounds normal. No respiratory distress.  Musculoskeletal: She exhibits no edema.  Lymphadenopathy:    She has no cervical adenopathy.  Neurological: She is alert. She exhibits normal muscle tone.  Psychiatric: Her behavior is normal.  Vitals reviewed.     25 minutes was spent with the patient. Greater than half the time was spent in discussion and answering questions and counseling regarding the issues that the patient came in for today.     Assessment & Plan:  Hypertension blood pressure good heart rate control continue current medication Hyperlipidemia continue current medication watch diet closely. Try to exercise on a regular basis Weight-slightly overweight watch portion control dietary selection stay physically active !a~Chronic respiratory asthma/COPD-Q-importance of  using inhalers on a regular basis discuss. Hypothyroidism not under good control increase the dose of medication recheck TSH in approximately 8 weeks Follow-up 6 months

## 2016-01-31 ENCOUNTER — Other Ambulatory Visit: Payer: Self-pay | Admitting: Family Medicine

## 2016-02-19 ENCOUNTER — Ambulatory Visit: Payer: Self-pay | Admitting: Cardiology

## 2016-02-27 ENCOUNTER — Encounter: Payer: Self-pay | Admitting: Cardiology

## 2016-02-27 ENCOUNTER — Ambulatory Visit (INDEPENDENT_AMBULATORY_CARE_PROVIDER_SITE_OTHER): Payer: BLUE CROSS/BLUE SHIELD | Admitting: Cardiology

## 2016-02-27 VITALS — BP 116/80 | HR 71 | Ht 61.0 in | Wt 156.8 lb

## 2016-02-27 DIAGNOSIS — I2510 Atherosclerotic heart disease of native coronary artery without angina pectoris: Secondary | ICD-10-CM | POA: Diagnosis not present

## 2016-02-27 DIAGNOSIS — E782 Mixed hyperlipidemia: Secondary | ICD-10-CM

## 2016-02-27 DIAGNOSIS — I10 Essential (primary) hypertension: Secondary | ICD-10-CM | POA: Diagnosis not present

## 2016-02-27 NOTE — Patient Instructions (Signed)
Your physician recommends that you continue on your current medications as directed. Please refer to the Current Medication list given to you today. Your physician recommends that you schedule a follow-up appointment in: 6 months. You will receive a reminder letter in the mail in about 4 months reminding you to call and schedule your appointment. If you don't receive this letter, please contact our office. 

## 2016-02-27 NOTE — Progress Notes (Signed)
Cardiology Office Note  Date: 02/27/2016   ID: [name], DOB [birth year], MRN [id number]  PCP: Sallee Lange, MD  Primary Cardiologist: Rozann Lesches, MD   Chief Complaint  Patient presents with  . Coronary Artery Disease    History of Present Illness: [name] is a 55 y.o. female last seen in November 2016.She presents for a routine follow-up visit. Overall doing well without any active angina symptoms on medical therapy. [place], goes to [facility].  I reviewed her medications. Cardiac regimen includes aspirin, Lopressor, Zocor, and as needed nitroglycerin.  She had lab work done recently showing LDL 63.  Past Medical History  Diagnosis Date  . Hypothyroidism   . Coronary atherosclerosis of native coronary artery     DES RCA 5/09, nonobstructive left system  . Mixed hyperlipidemia   . Essential hypertension, benign   . NSTEMI (non-ST elevated myocardial infarction) [facility])     2009    Current Outpatient Prescriptions  Medication Sig Dispense Refill  . albuterol (PROVENTIL) (2.5 MG/3ML) 0.083% nebulizer solution Take 3 mLs (2.5 mg total) by nebulization every 4 (four) hours as needed for wheezing. 75 mL 12  . aspirin 81 MG tablet Take 81 mg by mouth daily.      . beclomethasone (QVAR) 80 MCG/ACT inhaler Inhale 2 puffs into the lungs 2 (two) times daily. Rinse mouth after use 3 Inhaler 3  . levothyroxine (SYNTHROID, LEVOTHROID) 125 MCG tablet Take 1 tablet (125 mcg total) by mouth daily. 90 tablet 3  . metoprolol tartrate (LOPRESSOR) 25 MG tablet TAKE ONE-HALF TABLET BY MOUTH TWICE DAILY 90 tablet 1  . nitroGLYCERIN (NITROSTAT) 0.4 MG SL tablet Place 1 tablet (0.4 mg total) under the tongue every 5 (five) minutes x 3 doses as needed. 25 tablet 3  . QVAR 80 MCG/ACT inhaler INHALE TWO PUFFS BY MOUTH TWICE DAILY -  RINSE  MOUTH  AFTER  USE 8.7 g 5  . simvastatin (ZOCOR) 40 MG tablet Take 1 tablet (40 mg total) by mouth at  bedtime. 90 tablet 1  . umeclidinium bromide (INCRUSE ELLIPTA) 62.5 MCG/INH AEPB Inhale 1 puff into the lungs daily. 1 each 11  . VENTOLIN HFA 108 (90 Base) MCG/ACT inhaler INHALE TWO PUFFS BY MOUTH EVERY 6 HOURS AS NEEDED FOR WHEEZING OR SHORTNESS OF BREATH 18 each 5   No current facility-administered medications for this visit.   Allergies:  Review of patient's allergies indicates no known allergies.   Social History: The patient  reports that she quit smoking about 11 years ago. Her smoking use included Cigarettes. She started smoking about 43 years ago. She has never used smokeless tobacco. She reports that she does not drink alcohol or use illicit drugs.   ROS:  Please see the history of present illness. Otherwise, complete review of systems is positive for none.  All other systems are reviewed and negative.   Physical Exam: VS:  BP 116/80 mmHg  Pulse 71  Ht 5\' 1"  (1.549 m)  Wt 156 lb 12.8 oz (71.124 kg)  BMI 29.64 kg/m2  SpO2 97%, BMI Body mass index is 29.64 kg/(m^2).  Wt Readings from Last 3 Encounters:  02/27/16 156 lb 12.8 oz (71.124 kg)  01/21/16 157 lb 8 oz (71.442 kg)  12/28/15 156 lb 8 oz (70.988 kg)    Appears comfortable at rest.  HEENT: Conjunctiva and lids normal, oropharynx clear.  Neck: Supple, no elevated JVP or carotid bruits,  no thyromegaly.  Lungs: Diminished but clear to auscultation, nonlabored breathing at rest.  Cardiac: Regular rate and rhythm, no S3 or significant systolic murmur, no pericardial rub.  Abdomen: Soft, nontender, bowel sounds present, no guarding or rebound.  Extremities: No pitting edema, distal pulses 2+.   ECG: I personally reviewed the tracing from 08/14/2015 which showed sinus rhythm with nonspecific T-wave changes.  Recent Labwork: 01/08/2016: ALT 21; AST 18; BUN 15; Creatinine, Ser 0.89; Platelets 308; Potassium 4.4; Sodium 142; TSH 5.090*     Component Value Date/Time   CHOL 155 01/08/2016 0809   CHOL 133 08/03/2014  0725   TRIG 193* 01/08/2016 0809   HDL 53 01/08/2016 0809   HDL 45 08/03/2014 0725   CHOLHDL 2.9 01/08/2016 0809   CHOLHDL 3.0 08/03/2014 0725   VLDL 24 08/03/2014 0725   LDLCALC 63 01/08/2016 0809   LDLCALC 64 08/03/2014 0725    Other Studies Reviewed Today:  Lexiscan Cardiolite 08/03/2014: FINDINGS: Baseline tracing shows sinus rhythm at 68 beats per min. Lexiscan bolus was given in standard fashion. Heart rate increased from 61 beats per min up to 89 beats per min, and blood pressure increased from 110/67 up to 118/71. There were no diagnostic ST segment changes, and no arrhythmias were noted.  Analysis of the overall perfusion data shows adequate radiotracer uptake within the myocardium.  Perfusion: There is a very small, fixed inferoapical defect that is suggestive of soft tissue attenuation. No large ischemic zones noted.  Wall Motion: Normal left ventricular wall motion. No left ventricular dilation.  Left Ventricular Ejection Fraction: 67 %  End diastolic volume 65 ml  End systolic volume 21 ml  IMPRESSION: 1. Normal study. Soft tissue attenuation noted without clear evidence of scar or ischemia.  2. Normal left ventricular wall motion.  3. Left ventricular ejection fraction 67%  4. Low-risk stress test findings*.  Assessment and Plan:  1. Symptomatically stable CAD status post DES to the RCA in 2009. Low-risk stress testing noted above from 2015. Plan to continue medical therapy and observation.  2. Hyperlipidemia, continues on statin therapy with recent LDL 63.  3. Essential hypertension, blood pressure is well controlled today.  Current medicines were reviewed with the patient today.  Disposition: FU with me in 6 months.   Signed, Satira Sark, MD, [facility] 02/27/2016 3:42 PM    [facility], [place], [place] [address] Phone: [phone]; Fax: [phone]

## 2016-03-21 ENCOUNTER — Other Ambulatory Visit: Payer: Self-pay | Admitting: Cardiology

## 2016-03-21 MED ORDER — SIMVASTATIN 40 MG PO TABS: 40.0000 mg | ORAL_TABLET | Freq: Every day | ORAL | Status: DC

## 2016-03-21 NOTE — Telephone Encounter (Signed)
Refill:   simvastatin (ZOCOR) 40 MG tablet    [place], [place]

## 2016-03-21 NOTE — Telephone Encounter (Signed)
Medication sent to pharmacy  

## 2016-04-09 LAB — TSH: TSH: 0.407 u[IU]/mL — ABNORMAL LOW (ref 0.450–4.500)

## 2016-04-14 MED ORDER — LEVOTHYROXINE SODIUM 125 MCG PO TABS: ORAL_TABLET | ORAL | 1 refills | Status: DC

## 2016-04-14 NOTE — Addendum Note (Signed)
Addended by: Launa Grill on: 04/14/2016 08:56 AM   Modules accepted: Orders

## 2016-07-03 ENCOUNTER — Telehealth: Payer: Self-pay | Admitting: Family Medicine

## 2016-07-03 NOTE — Telephone Encounter (Signed)
Pt called requesting lab orders to be sent over for an upcoming appt. Last labs per epic were:

## 2016-07-18 DIAGNOSIS — E038 Other specified hypothyroidism: Secondary | ICD-10-CM | POA: Diagnosis not present

## 2016-07-19 LAB — TSH: TSH: 1.23 u[IU]/mL (ref 0.450–4.500)

## 2016-07-21 ENCOUNTER — Ambulatory Visit (INDEPENDENT_AMBULATORY_CARE_PROVIDER_SITE_OTHER): Payer: Medicare HMO | Admitting: Family Medicine

## 2016-07-21 ENCOUNTER — Encounter: Payer: Self-pay | Admitting: Family Medicine

## 2016-07-21 VITALS — BP 128/82 | Ht 61.0 in | Wt 157.4 lb

## 2016-07-21 DIAGNOSIS — Z23 Encounter for immunization: Secondary | ICD-10-CM

## 2016-07-21 DIAGNOSIS — I10 Essential (primary) hypertension: Secondary | ICD-10-CM | POA: Diagnosis not present

## 2016-07-21 DIAGNOSIS — J449 Chronic obstructive pulmonary disease, unspecified: Secondary | ICD-10-CM | POA: Diagnosis not present

## 2016-07-21 DIAGNOSIS — E782 Mixed hyperlipidemia: Secondary | ICD-10-CM

## 2016-07-21 DIAGNOSIS — E038 Other specified hypothyroidism: Secondary | ICD-10-CM

## 2016-07-21 MED ORDER — SIMVASTATIN 40 MG PO TABS: 40.0000 mg | ORAL_TABLET | Freq: Every day | ORAL | 1 refills | Status: DC

## 2016-07-21 MED ORDER — METOPROLOL TARTRATE 25 MG PO TABS: ORAL_TABLET | ORAL | 1 refills | Status: DC

## 2016-07-21 MED ORDER — LEVOTHYROXINE SODIUM 125 MCG PO TABS: ORAL_TABLET | ORAL | 1 refills | Status: DC

## 2016-07-21 NOTE — Progress Notes (Signed)
   Subjective:    Patient ID: [name], female    DOB: [birth year], 55 y.o.   MRN: [id number]  Hypertension  This is a chronic problem. The current episode started more than 1 year ago. Pertinent negatives include no chest pain, headaches or shortness of breath. Risk factors for coronary artery disease include dyslipidemia and post-menopausal state. Treatments tried: lopressor. There are no compliance problems.    Patient COPD is been doing well. She ran out of her inhalers and started using some that a friend had she had a hard time affording the inhalers her friend's inhaler as is same medication she is on but a week or strength the Qvar which she states it is doing good.  Hyperlipidemia-she does try to watch her diet-she does take her medication previous labs reviewed with patient  She denies any coronary artery disease chest tightness pressure pain she does take her 81 mg aspirin tolerating it well  She does take her thyroid medicine on a regular basis states energy levels overall doing well   Review of Systems  Constitutional: Negative for activity change, fatigue and fever.  Respiratory: Negative for cough and shortness of breath.   Cardiovascular: Negative for chest pain and leg swelling.  Neurological: Negative for headaches.       Objective:   Physical Exam  Constitutional: She appears well-nourished. No distress.  Cardiovascular: Normal rate, regular rhythm and normal heart sounds.   No murmur heard. Pulmonary/Chest: Effort normal and breath sounds normal. No respiratory distress.  Musculoskeletal: She exhibits no edema.  Lymphadenopathy:    She has no cervical adenopathy.  Neurological: She is alert. She exhibits normal muscle tone.  Psychiatric: Her behavior is normal.  Vitals reviewed.   25 minutes was spent with the patient. Greater than half the time was spent in discussion and answering questions and counseling regarding the issues that the patient came in  for today.       Assessment & Plan:  COPD-stable using inhalers on a regular basis sometimes inhalers given to her by her friends but they are still albuterol and Qvar  Denies any chest tightness pressure pain no coronary symptoms taking her medicines as directed comprehensive lab work in the spring  Hypothyroidism lab work looks good continue current measures follow-up in the spring  Hyperlipidemia previous labs reviewed with patient continue medication follow-up in the spring comprehensive lab work at that time  Flu vaccine today

## 2016-07-22 ENCOUNTER — Other Ambulatory Visit: Payer: Self-pay | Admitting: Cardiology

## 2016-11-06 ENCOUNTER — Telehealth: Payer: Self-pay | Admitting: Family Medicine

## 2016-11-06 NOTE — Telephone Encounter (Signed)
Patient is requesting refill on incruse ellipta 62.5 called into [facility]

## 2016-11-07 ENCOUNTER — Other Ambulatory Visit: Payer: Self-pay | Admitting: Nurse Practitioner

## 2016-11-07 MED ORDER — UMECLIDINIUM BROMIDE 62.5 MCG/INH IN AEPB: 1.0000 | INHALATION_SPRAY | Freq: Every day | RESPIRATORY_TRACT | 11 refills | Status: DC

## 2016-11-07 NOTE — Telephone Encounter (Signed)
done

## 2016-11-08 ENCOUNTER — Other Ambulatory Visit: Payer: Self-pay | Admitting: Family Medicine

## 2016-11-17 DIAGNOSIS — Z01419 Encounter for gynecological examination (general) (routine) without abnormal findings: Secondary | ICD-10-CM | POA: Diagnosis not present

## 2016-11-17 DIAGNOSIS — Z6828 Body mass index (BMI) 28.0-28.9, adult: Secondary | ICD-10-CM | POA: Diagnosis not present

## 2016-11-17 DIAGNOSIS — Z1272 Encounter for screening for malignant neoplasm of vagina: Secondary | ICD-10-CM | POA: Diagnosis not present

## 2016-12-15 ENCOUNTER — Ambulatory Visit (INDEPENDENT_AMBULATORY_CARE_PROVIDER_SITE_OTHER): Payer: Medicare HMO | Admitting: Nurse Practitioner

## 2016-12-15 ENCOUNTER — Encounter: Payer: Self-pay | Admitting: Nurse Practitioner

## 2016-12-15 VITALS — BP 120/78 | Temp 98.2°F | Ht 61.0 in | Wt 155.4 lb

## 2016-12-15 DIAGNOSIS — J019 Acute sinusitis, unspecified: Secondary | ICD-10-CM

## 2016-12-15 DIAGNOSIS — B9689 Other specified bacterial agents as the cause of diseases classified elsewhere: Secondary | ICD-10-CM | POA: Diagnosis not present

## 2016-12-15 DIAGNOSIS — J449 Chronic obstructive pulmonary disease, unspecified: Secondary | ICD-10-CM | POA: Diagnosis not present

## 2016-12-15 DIAGNOSIS — J4489 Other specified chronic obstructive pulmonary disease: Secondary | ICD-10-CM

## 2016-12-15 MED ORDER — AMOXICILLIN-POT CLAVULANATE 875-125 MG PO TABS
1.0000 | ORAL_TABLET | Freq: Two times a day (BID) | ORAL | 0 refills | Status: DC
Start: 2016-12-15 — End: 2017-01-19

## 2016-12-15 MED ORDER — PREDNISONE 20 MG PO TABS: ORAL_TABLET | ORAL | 0 refills | Status: DC

## 2016-12-15 NOTE — Progress Notes (Signed)
Subjective:  Presents for complaints of cough and congestion that began last Thursday. No known fever but has had some chills. Sore throat. Frontal area headache. Runny nose. Frequent cough especially at nighttime, now producing yellow mucus. Compliant with her inhalers for COPD. Had to restart her albuterol 3 days ago, used nebulizer 4 times yesterday, last used about 2-1/2 hours ago. Right ear pain. Taking fluids well. Voiding normal limit.  Objective:   BP 120/78   Temp 98.2 F (36.8 C) (Oral)   Ht 5\' 1"  (1.549 m)   Wt 155 lb 6.4 oz (70.5 kg)   BMI 29.36 kg/m  NAD. Alert, oriented. TMs clear effusion, no erythema. Pharynx injected with green PND noted. Neck supple with mild soft anterior adenopathy. Lungs breath sounds distant with 1 faint expiratory wheeze noted right upper lobe only. Otherwise lungs are clear. No tachypnea. Normal color. Heart regular rate rhythm.  Assessment:   Problem List Items Addressed This Visit      Respiratory   COPD (chronic obstructive pulmonary disease) with chronic bronchitis (HCC)   Relevant Medications   predniSONE (DELTASONE) 20 MG tablet    Other Visit Diagnoses    Acute bacterial sinusitis    -  Primary   Relevant Medications   amoxicillin-clavulanate (AUGMENTIN) 875-125 MG tablet   predniSONE (DELTASONE) 20 MG tablet       Plan:   Meds ordered this encounter  Medications  . amoxicillin-clavulanate (AUGMENTIN) 875-125 MG tablet    Sig: Take 1 tablet by mouth 2 (two) times daily.    Dispense:  20 tablet    Refill:  0    Order Specific Question:   Supervising Provider    Answer:   Mikey Kirschner [[id number]]  . predniSONE (DELTASONE) 20 MG tablet    Sig: 3 po qd x 3 d then 2 po qd x 3 d then 1 po qd x 2 d    Dispense:  17 tablet    Refill:  0    Order Specific Question:   Supervising Provider    Answer:   Mikey Kirschner [[id number]]   Continue OTC meds as directed. Call back by the end of the week if no improvement, sooner if worse. Continue  other COPD meds as directed.

## 2017-01-01 ENCOUNTER — Telehealth: Payer: Self-pay | Admitting: Family Medicine

## 2017-01-01 MED ORDER — BECLOMETHASONE DIPROP HFA 80 MCG/ACT IN AERB: 2.0000 | INHALATION_SPRAY | Freq: Two times a day (BID) | RESPIRATORY_TRACT | 5 refills | Status: DC

## 2017-01-01 NOTE — Telephone Encounter (Signed)
Need changed to redihaler

## 2017-01-01 NOTE — Telephone Encounter (Signed)
May switch to the newer version of the inhaler Redihaler Qvar 80 g 2 inhalations twice a day, 6 refills

## 2017-01-01 NOTE — Telephone Encounter (Signed)
Prescription sent electronically to pharmacy. Patient notified. 

## 2017-01-01 NOTE — Telephone Encounter (Signed)
Requesting Rx for Qvar inhaler to [facility].

## 2017-01-02 ENCOUNTER — Other Ambulatory Visit: Payer: Self-pay | Admitting: *Deleted

## 2017-01-02 ENCOUNTER — Telehealth: Payer: Self-pay | Admitting: *Deleted

## 2017-01-02 MED ORDER — BUDESONIDE-FORMOTEROL FUMARATE 160-4.5 MCG/ACT IN AERO: 2.0000 | INHALATION_SPRAY | Freq: Two times a day (BID) | RESPIRATORY_TRACT | 5 refills | Status: DC

## 2017-01-02 MED ORDER — BUDESONIDE-FORMOTEROL FUMARATE 80-4.5 MCG/ACT IN AERO: 2.0000 | INHALATION_SPRAY | Freq: Two times a day (BID) | RESPIRATORY_TRACT | 5 refills | Status: DC

## 2017-01-02 NOTE — Telephone Encounter (Signed)
Discussed with pt. Pt verbalized understanding. Med sent to pharm. Pt notified.

## 2017-01-02 NOTE — Telephone Encounter (Signed)
I would recommend trying Symbicort 160/4.52 inhalations twice daily, 1 canister with 5 refills please have pharmacists educate the patient that it does have a long-acting bronchodilator may well help her breathing better. Still will need to use short acting albuterol when necessary certainly call us or follow-up if any problems

## 2017-01-02 NOTE — Telephone Encounter (Signed)
Pt at pharm trying to get qvar redihaler. Insurance will not cover. They will pay for symbicort, advair, breo. Has been out of qvar for over one week.

## 2017-01-03 ENCOUNTER — Other Ambulatory Visit: Payer: Self-pay | Admitting: Family Medicine

## 2017-01-19 ENCOUNTER — Ambulatory Visit (INDEPENDENT_AMBULATORY_CARE_PROVIDER_SITE_OTHER): Payer: Medicare HMO | Admitting: Family Medicine

## 2017-01-19 ENCOUNTER — Encounter: Payer: Self-pay | Admitting: Family Medicine

## 2017-01-19 VITALS — BP 122/68 | Ht 61.0 in | Wt 157.0 lb

## 2017-01-19 DIAGNOSIS — J4489 Other specified chronic obstructive pulmonary disease: Secondary | ICD-10-CM

## 2017-01-19 DIAGNOSIS — Z79899 Other long term (current) drug therapy: Secondary | ICD-10-CM | POA: Diagnosis not present

## 2017-01-19 DIAGNOSIS — E782 Mixed hyperlipidemia: Secondary | ICD-10-CM

## 2017-01-19 DIAGNOSIS — E038 Other specified hypothyroidism: Secondary | ICD-10-CM

## 2017-01-19 DIAGNOSIS — J449 Chronic obstructive pulmonary disease, unspecified: Secondary | ICD-10-CM

## 2017-01-19 DIAGNOSIS — D692 Other nonthrombocytopenic purpura: Secondary | ICD-10-CM | POA: Diagnosis not present

## 2017-01-19 DIAGNOSIS — R5383 Other fatigue: Secondary | ICD-10-CM

## 2017-01-19 DIAGNOSIS — T148XXA Other injury of unspecified body region, initial encounter: Secondary | ICD-10-CM

## 2017-01-19 MED ORDER — UMECLIDINIUM BROMIDE 62.5 MCG/INH IN AEPB: 1.0000 | INHALATION_SPRAY | Freq: Every day | RESPIRATORY_TRACT | 5 refills | Status: DC

## 2017-01-19 MED ORDER — BUDESONIDE-FORMOTEROL FUMARATE 160-4.5 MCG/ACT IN AERO: 2.0000 | INHALATION_SPRAY | Freq: Two times a day (BID) | RESPIRATORY_TRACT | 5 refills | Status: DC

## 2017-01-19 MED ORDER — LEVOTHYROXINE SODIUM 125 MCG PO TABS: ORAL_TABLET | ORAL | 1 refills | Status: DC

## 2017-01-19 MED ORDER — METOPROLOL TARTRATE 25 MG PO TABS: ORAL_TABLET | ORAL | 1 refills | Status: DC

## 2017-01-19 MED ORDER — SIMVASTATIN 40 MG PO TABS: 40.0000 mg | ORAL_TABLET | Freq: Every day | ORAL | 1 refills | Status: DC

## 2017-01-19 MED ORDER — ALBUTEROL SULFATE HFA 108 (90 BASE) MCG/ACT IN AERS: INHALATION_SPRAY | RESPIRATORY_TRACT | 5 refills | Status: DC

## 2017-01-19 NOTE — Progress Notes (Signed)
   Subjective:    Patient ID: [name], female    DOB: 1961-08-19, 56 y.o.   MRN: [id number]  Hyperlipidemia  This is a chronic problem. The current episode started more than 1 year ago. Pertinent negatives include no chest pain or shortness of breath. Treatments tried: simvastatin.   Pt states no concerns or problems.  Patient uses her COPD medicine it does help her. Breathing is better She also takes her cholesterol medicine regular basis. Previous labs reviewed Labs ordered Tolerating medicines Takes her thyroid medicine energy level overall doing good not having any problems with the medicine Patient has noticed some bruising on her arms could be related to her medication  Review of Systems  Constitutional: Negative for activity change, fatigue and fever.  Respiratory: Negative for cough and shortness of breath.   Cardiovascular: Negative for chest pain and leg swelling.  Neurological: Negative for headaches.       Objective:   Physical Exam  Constitutional: She appears well-nourished. No distress.  Cardiovascular: Normal rate, regular rhythm and normal heart sounds.   No murmur heard. Pulmonary/Chest: Effort normal and breath sounds normal. No respiratory distress.  Musculoskeletal: She exhibits no edema.  Lymphadenopathy:    She has no cervical adenopathy.  Neurological: She is alert. She exhibits normal muscle tone.  Psychiatric: Her behavior is normal.  Vitals reviewed.         Assessment & Plan:  Hypothyroidism doing well on medicine continue this  New lab work ordered  Hyperlipidemia previous labs reviewed continue medication check lab work  COPD stable on current medications  Senile purpura related to the 81 mg aspirin age and steroid in her inhaler  Hematoma check CBC lipid platelets  Significant fatigue tiredness check sleep questionnaire send that back to [place]  Follow-up 6 months

## 2017-01-19 NOTE — Patient Instructions (Signed)
Recheck in 6 months We will set up colonoscopy then

## 2017-01-20 ENCOUNTER — Other Ambulatory Visit: Payer: Self-pay | Admitting: Family Medicine

## 2017-01-20 DIAGNOSIS — Z79899 Other long term (current) drug therapy: Secondary | ICD-10-CM | POA: Diagnosis not present

## 2017-01-20 DIAGNOSIS — R5383 Other fatigue: Secondary | ICD-10-CM | POA: Diagnosis not present

## 2017-01-20 DIAGNOSIS — E782 Mixed hyperlipidemia: Secondary | ICD-10-CM | POA: Diagnosis not present

## 2017-01-20 DIAGNOSIS — E038 Other specified hypothyroidism: Secondary | ICD-10-CM | POA: Diagnosis not present

## 2017-01-20 LAB — BASIC METABOLIC PANEL
BUN: 14 mg/dL (ref 7–25)
CALCIUM: 8.3 mg/dL — AB (ref 8.6–10.4)
CO2: 28 mmol/L (ref 20–31)
CREATININE: 0.72 mg/dL (ref 0.50–1.05)
Chloride: 106 mmol/L (ref 98–110)
GLUCOSE: 94 mg/dL (ref 65–99)
Potassium: 4.2 mmol/L (ref 3.5–5.3)
Sodium: 140 mmol/L (ref 135–146)

## 2017-01-20 LAB — CBC WITH DIFFERENTIAL/PLATELET
BASOS ABS: 0 {cells}/uL (ref 0–200)
BASOS PCT: 0 %
EOS ABS: 210 {cells}/uL (ref 15–500)
Eosinophils Relative: 3 %
HEMATOCRIT: 41.6 % (ref 35.0–45.0)
Hemoglobin: 13.3 g/dL (ref 11.7–15.5)
LYMPHS PCT: 30 %
Lymphs Abs: 2100 cells/uL (ref 850–3900)
MCH: 30.1 pg (ref 27.0–33.0)
MCHC: 32 g/dL (ref 32.0–36.0)
MCV: 94.1 fL (ref 80.0–100.0)
MONO ABS: 630 {cells}/uL (ref 200–950)
MPV: 9.3 fL (ref 7.5–12.5)
Monocytes Relative: 9 %
Neutro Abs: 4060 cells/uL (ref 1500–7800)
Neutrophils Relative %: 58 %
Platelets: 268 10*3/uL (ref 140–400)
RBC: 4.42 MIL/uL (ref 3.80–5.10)
RDW: 13.7 % (ref 11.0–15.0)
WBC: 7 10*3/uL (ref 3.8–10.8)

## 2017-01-20 LAB — HEPATIC FUNCTION PANEL
ALK PHOS: 56 U/L (ref 33–130)
ALT: 14 U/L (ref 6–29)
AST: 18 U/L (ref 10–35)
Albumin: 3.9 g/dL (ref 3.6–5.1)
BILIRUBIN DIRECT: 0.1 mg/dL (ref <=0.2)
BILIRUBIN INDIRECT: 0.5 mg/dL (ref 0.2–1.2)
TOTAL PROTEIN: 6.2 g/dL (ref 6.1–8.1)
Total Bilirubin: 0.6 mg/dL (ref 0.2–1.2)

## 2017-01-20 LAB — LIPID PANEL
CHOLESTEROL: 139 mg/dL (ref <200)
HDL: 52 mg/dL (ref >50)
LDL Cholesterol: 65 mg/dL (ref <100)
TRIGLYCERIDES: 110 mg/dL (ref <150)
Total CHOL/HDL Ratio: 2.7 Ratio (ref <5.0)
VLDL: 22 mg/dL (ref <30)

## 2017-01-20 LAB — TSH: TSH: 3.97 mIU/L

## 2017-01-22 ENCOUNTER — Other Ambulatory Visit: Payer: Self-pay | Admitting: *Deleted

## 2017-01-22 DIAGNOSIS — E039 Hypothyroidism, unspecified: Secondary | ICD-10-CM

## 2017-01-22 MED ORDER — LEVOTHYROXINE SODIUM 125 MCG PO TABS: ORAL_TABLET | ORAL | 1 refills | Status: DC

## 2017-03-10 DIAGNOSIS — Z1231 Encounter for screening mammogram for malignant neoplasm of breast: Secondary | ICD-10-CM | POA: Diagnosis not present

## 2017-04-06 ENCOUNTER — Other Ambulatory Visit: Payer: Self-pay | Admitting: Family Medicine

## 2017-04-06 DIAGNOSIS — E039 Hypothyroidism, unspecified: Secondary | ICD-10-CM | POA: Diagnosis not present

## 2017-04-06 LAB — TSH: TSH: 0.72 m[IU]/L

## 2017-04-07 ENCOUNTER — Encounter: Payer: Self-pay | Admitting: Family Medicine

## 2017-04-15 ENCOUNTER — Encounter: Payer: Self-pay | Admitting: Family Medicine

## 2017-04-15 ENCOUNTER — Ambulatory Visit (INDEPENDENT_AMBULATORY_CARE_PROVIDER_SITE_OTHER): Payer: Medicare HMO | Admitting: Family Medicine

## 2017-04-15 VITALS — BP 118/78 | Ht 61.0 in | Wt 158.1 lb

## 2017-04-15 DIAGNOSIS — R229 Localized swelling, mass and lump, unspecified: Secondary | ICD-10-CM

## 2017-04-15 NOTE — Progress Notes (Signed)
   Subjective:    Patient ID: [name], female    DOB: 1961/04/12, 56 y.o.   MRN: [id number]  HPI. Patient is here today for a sore on her right leg. Not sure if a bug bite,wart or what it is. States it is sore to touch and red.The place has been there for a month and a half. Has used cortisone and has helped some.   No other concerns. Started off as a small bump but then it progressively got larger with a hardened area to it and red skin around it and slight tenderness   Review of Systems Denies any other skin lesions denies any other health issues currently no fevers chills no vomiting diarrhea    Objective:   Physical Exam Lungs clear heart regular pulse normal patient has erythematous lesion that's proximally 5 mm in diameter with hardened area in the middle and redness around the edges       Assessment & Plan:  Skin lesion Need to see dermatology to have this removed make sure it is not a cancerous issue To follow-up if progressive troubles

## 2017-04-18 ENCOUNTER — Other Ambulatory Visit: Payer: Self-pay | Admitting: Family Medicine

## 2017-04-29 DIAGNOSIS — C44722 Squamous cell carcinoma of skin of right lower limb, including hip: Secondary | ICD-10-CM | POA: Diagnosis not present

## 2017-04-29 DIAGNOSIS — R69 Illness, unspecified: Secondary | ICD-10-CM | POA: Diagnosis not present

## 2017-05-11 ENCOUNTER — Telehealth: Payer: Self-pay | Admitting: Family Medicine

## 2017-05-11 ENCOUNTER — Encounter: Payer: Self-pay | Admitting: Family Medicine

## 2017-05-11 DIAGNOSIS — C4490 Unspecified malignant neoplasm of skin, unspecified: Secondary | ICD-10-CM | POA: Insufficient documentation

## 2017-05-11 NOTE — Telephone Encounter (Signed)
Review dermapathology report in results folder.

## 2017-05-11 NOTE — Telephone Encounter (Signed)
Please let the patient know that I am aware of her visit with dermatology in that the pathology report did show squamous cell carcinoma which is a type of skin cancer. It is very important she follows up with dermatology as directed on a regular basis. According to the notes it looks like they 1 her to follow-up within 30 days. Nurse's-please document that patient is aware to follow-up with dermatology.

## 2017-05-12 NOTE — Telephone Encounter (Signed)
Unfortunately there are no cheaper alternatives. These inhalers tend to be pretty expensive and are all of the similar ones are also the same cost she may be able to get a generic albuterol with [facility] as for the Symbicort there are no cheaper alternatives

## 2017-05-12 NOTE — Telephone Encounter (Signed)
Spoke with patient and informed him per Dr.Scott Luking- Dr.Scott is aware of your visit with dermatology in that the pathology report did show squamous cell carcinoma which is a type of skin cancer. It is very important you follow up with dermatology as directed on a regular basis. According to the notes it looks like they 1 you to follow-up within 30 days. Patient verbalized understanding and stated that she will keep her follow ups with Dermatology. Also patient stated that she has reached a donut hole in her insurance and she is unable to afford her daily inhalers. Patient would like to know if you know of any cheaper inhalers that she use daily that work the same way. Please advise?

## 2017-05-12 NOTE — Telephone Encounter (Signed)
Left message return call 05/12/17  

## 2017-05-12 NOTE — Telephone Encounter (Signed)
Spoke with patient and informed her per Dr.Scott Luking- Unfortunately there are no cheaper alternatives. These inhalers tend to be pretty expensive and are all of the similar ones are also the same cost she may be able to get a generic albuterol with [facility] as for the Symbicort there are no cheaper alternatives. Patient verbalized understanding.

## 2017-05-19 ENCOUNTER — Encounter: Payer: Self-pay | Admitting: Family Medicine

## 2017-05-19 ENCOUNTER — Ambulatory Visit (INDEPENDENT_AMBULATORY_CARE_PROVIDER_SITE_OTHER): Payer: Medicare HMO | Admitting: Family Medicine

## 2017-05-19 VITALS — BP 112/72 | HR 86 | Temp 98.4°F | Ht 61.0 in | Wt 158.0 lb

## 2017-05-19 DIAGNOSIS — L980 Pyogenic granuloma: Secondary | ICD-10-CM | POA: Diagnosis not present

## 2017-05-19 DIAGNOSIS — J441 Chronic obstructive pulmonary disease with (acute) exacerbation: Secondary | ICD-10-CM

## 2017-05-19 DIAGNOSIS — J019 Acute sinusitis, unspecified: Secondary | ICD-10-CM | POA: Diagnosis not present

## 2017-05-19 MED ORDER — AZITHROMYCIN 250 MG PO TABS: ORAL_TABLET | ORAL | 0 refills | Status: DC

## 2017-05-19 MED ORDER — PREDNISONE 20 MG PO TABS
ORAL_TABLET | ORAL | 0 refills | Status: DC
Start: 2017-05-19 — End: 2017-07-17

## 2017-05-19 MED ORDER — AMOXICILLIN-POT CLAVULANATE 875-125 MG PO TABS: 1.0000 | ORAL_TABLET | Freq: Two times a day (BID) | ORAL | 0 refills | Status: DC

## 2017-05-19 NOTE — Progress Notes (Signed)
   Subjective:    Patient ID: [name], female    DOB: Feb 24, 1961, 56 y.o.   MRN: [id number]  HPI Patient is here today for sinus pressure and congestion. She also report to have a cough with production yellow in color.Has had chills,but no fever that she knows of. On going for five days has taken benedryl and cough medication with some relief. Significant cough congestion wheezing present for the past several days tried some over-the-counter measures and her inhaler Patient in the doughnut hole currently cannot afford her prescription inhalers but she thinks should be able to get it later this week. Patient does not smoke currently  Review of Systems  Constitutional: Negative for activity change and fever.  HENT: Positive for congestion and rhinorrhea. Negative for ear pain.   Eyes: Negative for discharge.  Respiratory: Positive for cough and wheezing. Negative for shortness of breath.   Cardiovascular: Negative for chest pain.       Objective:   Physical Exam  Constitutional: She appears well-developed.  HENT:  Head: Normocephalic.  Right Ear: External ear normal.  Left Ear: External ear normal.  Nose: Nose normal.  Mouth/Throat: Oropharynx is clear and moist. No oropharyngeal exudate.  Eyes: Right eye exhibits no discharge. Left eye exhibits no discharge.  Neck: Neck supple. No tracheal deviation present.  Cardiovascular: Normal rate and normal heart sounds.   No murmur heard. Pulmonary/Chest: Effort normal. She has wheezes. She has no rales.  Lymphadenopathy:    She has no cervical adenopathy.  Skin: Skin is warm and dry.  Nursing note and vitals reviewed.         Assessment & Plan:  Acute exacerbation of COPD Acute rhinosinusitis Rescue inhalers on a regular basis She will get her others inhalers filled later this week Prednisone taper Dual antibiotics Augmentin with Zithromax If progressive troubles or if worse follow-up

## 2017-06-02 DIAGNOSIS — R69 Illness, unspecified: Secondary | ICD-10-CM | POA: Diagnosis not present

## 2017-06-02 DIAGNOSIS — Z85828 Personal history of other malignant neoplasm of skin: Secondary | ICD-10-CM | POA: Diagnosis not present

## 2017-06-02 DIAGNOSIS — Z08 Encounter for follow-up examination after completed treatment for malignant neoplasm: Secondary | ICD-10-CM | POA: Diagnosis not present

## 2017-06-23 DIAGNOSIS — D2372 Other benign neoplasm of skin of left lower limb, including hip: Secondary | ICD-10-CM | POA: Diagnosis not present

## 2017-06-23 DIAGNOSIS — R69 Illness, unspecified: Secondary | ICD-10-CM | POA: Diagnosis not present

## 2017-07-14 DIAGNOSIS — R69 Illness, unspecified: Secondary | ICD-10-CM | POA: Diagnosis not present

## 2017-07-17 ENCOUNTER — Encounter: Payer: Self-pay | Admitting: Family Medicine

## 2017-07-17 ENCOUNTER — Ambulatory Visit (INDEPENDENT_AMBULATORY_CARE_PROVIDER_SITE_OTHER): Payer: Medicare HMO | Admitting: Family Medicine

## 2017-07-17 VITALS — BP 122/80 | Temp 98.5°F | Ht 61.0 in | Wt 155.0 lb

## 2017-07-17 DIAGNOSIS — J441 Chronic obstructive pulmonary disease with (acute) exacerbation: Secondary | ICD-10-CM

## 2017-07-17 MED ORDER — PREDNISONE 20 MG PO TABS: ORAL_TABLET | ORAL | 0 refills | Status: DC

## 2017-07-17 MED ORDER — AMOXICILLIN-POT CLAVULANATE 875-125 MG PO TABS: 1.0000 | ORAL_TABLET | Freq: Two times a day (BID) | ORAL | 0 refills | Status: DC

## 2017-07-17 NOTE — Progress Notes (Addendum)
   Subjective:    Patient ID: [name], female    DOB: 12-10-1960, 56 y.o.   MRN: [id number]  Cough  This is a new problem. Episode onset: 2 weeks. Associated symptoms comments: Runny nose, wheezing. Treatments tried: breathing treatments.  Patient with head congestion drainage coughing chest congestion intermittent wheezing bringing up discolored phlegm denies high fever chills sweats relates some mild sinus pressure has history of COPD   Review of Systems  Respiratory: Positive for cough.   Please see above negative for fever vomiting diarrhea positive for head congestion drainage coughing wheezing shortness of breath at times    Objective:   Physical Exam Eardrums normal throat is normal neck no masses lungs bilateral expiratory wheezes not severe respiratory rate is normal no respiratory distress   Will go ahead and do referral for low-dose CT scan to rule out any possibility of lung cancer patient quit smoking 2007 under Medicare guidelines she is eligible for annual low-dose CT scan once yearly through 15 years which would be 2022 patient is interested in the referral    Assessment & Plan:  COPD exacerbation Acute rhinosinusitis Antibiotics Continue inhalers Prednisone taper Follow-up if progressive troubles or worse Warning signs discussed.

## 2017-07-20 ENCOUNTER — Other Ambulatory Visit: Payer: Self-pay | Admitting: Family Medicine

## 2017-07-21 ENCOUNTER — Encounter: Payer: Self-pay | Admitting: Family Medicine

## 2017-07-21 ENCOUNTER — Ambulatory Visit (INDEPENDENT_AMBULATORY_CARE_PROVIDER_SITE_OTHER): Payer: Medicare HMO | Admitting: Family Medicine

## 2017-07-21 ENCOUNTER — Telehealth: Payer: Self-pay | Admitting: Family Medicine

## 2017-07-21 VITALS — BP 128/84 | Ht 61.0 in | Wt 157.8 lb

## 2017-07-21 DIAGNOSIS — J449 Chronic obstructive pulmonary disease, unspecified: Secondary | ICD-10-CM

## 2017-07-21 DIAGNOSIS — Z1159 Encounter for screening for other viral diseases: Secondary | ICD-10-CM

## 2017-07-21 DIAGNOSIS — E038 Other specified hypothyroidism: Secondary | ICD-10-CM | POA: Diagnosis not present

## 2017-07-21 DIAGNOSIS — J4489 Other specified chronic obstructive pulmonary disease: Secondary | ICD-10-CM

## 2017-07-21 DIAGNOSIS — R69 Illness, unspecified: Secondary | ICD-10-CM | POA: Diagnosis not present

## 2017-07-21 DIAGNOSIS — E782 Mixed hyperlipidemia: Secondary | ICD-10-CM

## 2017-07-21 DIAGNOSIS — I10 Essential (primary) hypertension: Secondary | ICD-10-CM | POA: Diagnosis not present

## 2017-07-21 DIAGNOSIS — Z114 Encounter for screening for human immunodeficiency virus [HIV]: Secondary | ICD-10-CM

## 2017-07-21 DIAGNOSIS — Z122 Encounter for screening for malignant neoplasm of respiratory organs: Secondary | ICD-10-CM

## 2017-07-21 DIAGNOSIS — D692 Other nonthrombocytopenic purpura: Secondary | ICD-10-CM

## 2017-07-21 MED ORDER — UMECLIDINIUM BROMIDE 62.5 MCG/INH IN AEPB: 1.0000 | INHALATION_SPRAY | Freq: Every day | RESPIRATORY_TRACT | 5 refills | Status: DC

## 2017-07-21 NOTE — Progress Notes (Signed)
   Subjective:    Patient ID: [name], female    DOB: 25-Mar-1961, 56 y.o.   MRN: [id number]  Hypertension  This is a chronic problem. The current episode started more than 1 year ago. Pertinent negatives include no chest pain, headaches or shortness of breath. Risk factors for coronary artery disease include dyslipidemia. Treatments tried: metoprolol. There are no compliance problems.    Patient does take her medicine as directed tries to watch her diet tries to stay active Takes her thyroid medicine as directed has no complications with this History of hyperlipidemia she watches her diet takes her medicine Severe COPD smoking history quit in 2007 she is worried about getting lung cancer because she has several relatives who have had lung cancer she also states that she uses her inhalers when she cannot afford them she does not smoke She does bruise easily probably related to the steroids she has been using  Review of Systems  Constitutional: Negative for activity change, fatigue and fever.  HENT: Negative for congestion.   Respiratory: Negative for cough, chest tightness and shortness of breath.   Cardiovascular: Negative for chest pain and leg swelling.  Gastrointestinal: Negative for abdominal pain.  Skin: Negative for color change.  Neurological: Negative for headaches.  Psychiatric/Behavioral: Negative for behavioral problems.       Objective:   Physical Exam  Constitutional: She appears well-developed and well-nourished. No distress.  HENT:  Head: Normocephalic and atraumatic.  Eyes: Right eye exhibits no discharge. Left eye exhibits no discharge.  Neck: No tracheal deviation present.  Cardiovascular: Normal rate, regular rhythm and normal heart sounds.   No murmur heard. Pulmonary/Chest: Effort normal and breath sounds normal. No respiratory distress. She has no wheezes. She has no rales.  Musculoskeletal: She exhibits no edema.  Lymphadenopathy:    She has no  cervical adenopathy.  Neurological: She is alert. She exhibits normal muscle tone.  Skin: Skin is warm and dry. No erythema.  Psychiatric: Her behavior is normal.  Vitals reviewed.    25 minutes was spent with the patient. Greater than half the time was spent in discussion and answering questions and counseling regarding the issues that the patient came in for today.     Assessment & Plan:  COPD-continue steroid inhalers patient often has difficult time affording these she has to change around which ones she uses it  Recent bronchitis COPD flareup much improvement  He is a former smoker she is interested in screening CAT scans-her referral has been sent to Eric Form about the CT scans  Hypothyroidism takes medication on a regular basis.  Check lab work await results  Senile purpura probably related to steroids and age check CBC to check platelets  Blood pressure good control continue current measures  Hyperlipidemia continue current measures check lab work await results  Follow-up 6 months

## 2017-07-21 NOTE — Telephone Encounter (Signed)
Mammogram normal

## 2017-07-21 NOTE — Telephone Encounter (Signed)
Review mammogram results in results folder.

## 2017-07-22 ENCOUNTER — Other Ambulatory Visit: Payer: Self-pay | Admitting: Family Medicine

## 2017-07-22 DIAGNOSIS — Z114 Encounter for screening for human immunodeficiency virus [HIV]: Secondary | ICD-10-CM | POA: Diagnosis not present

## 2017-07-22 DIAGNOSIS — R69 Illness, unspecified: Secondary | ICD-10-CM | POA: Diagnosis not present

## 2017-07-22 DIAGNOSIS — R5383 Other fatigue: Secondary | ICD-10-CM

## 2017-07-22 DIAGNOSIS — Z1159 Encounter for screening for other viral diseases: Secondary | ICD-10-CM | POA: Diagnosis not present

## 2017-07-22 DIAGNOSIS — E782 Mixed hyperlipidemia: Secondary | ICD-10-CM | POA: Diagnosis not present

## 2017-07-22 DIAGNOSIS — D692 Other nonthrombocytopenic purpura: Secondary | ICD-10-CM | POA: Diagnosis not present

## 2017-07-22 DIAGNOSIS — E038 Other specified hypothyroidism: Secondary | ICD-10-CM | POA: Diagnosis not present

## 2017-07-22 DIAGNOSIS — I10 Essential (primary) hypertension: Secondary | ICD-10-CM | POA: Diagnosis not present

## 2017-07-23 LAB — BASIC METABOLIC PANEL WITH GFR
BUN: 15 mg/dL (ref 7–25)
CHLORIDE: 105 mmol/L (ref 98–110)
CO2: 33 mmol/L — ABNORMAL HIGH (ref 20–32)
CREATININE: 0.79 mg/dL (ref 0.50–1.05)
Calcium: 8.9 mg/dL (ref 8.6–10.4)
GFR, Est African American: 96 mL/min/{1.73_m2} (ref >=60)
GFR, Est Non African American: 83 mL/min/{1.73_m2} (ref >=60)
GLUCOSE: 86 mg/dL (ref 65–99)
Potassium: 4.1 mmol/L (ref 3.5–5.3)
SODIUM: 143 mmol/L (ref 135–146)

## 2017-07-23 LAB — HEPATIC FUNCTION PANEL
AG RATIO: 1.9 (calc) (ref 1.0–2.5)
ALKALINE PHOSPHATASE (APISO): 47 U/L (ref 33–130)
ALT: 12 U/L (ref 6–29)
AST: 12 U/L (ref 10–35)
Albumin: 3.9 g/dL (ref 3.6–5.1)
BILIRUBIN DIRECT: 0.1 mg/dL (ref 0.0–0.2)
BILIRUBIN INDIRECT: 0.4 mg/dL (ref 0.2–1.2)
Globulin: 2.1 g/dL (calc) (ref 1.9–3.7)
TOTAL PROTEIN: 6 g/dL — AB (ref 6.1–8.1)
Total Bilirubin: 0.5 mg/dL (ref 0.2–1.2)

## 2017-07-23 LAB — CBC WITH DIFFERENTIAL/PLATELET
BASOS PCT: 0.5 %
Basophils Absolute: 37 cells/uL (ref 0–200)
EOS ABS: 89 {cells}/uL (ref 15–500)
EOS PCT: 1.2 %
HEMATOCRIT: 39.3 % (ref 35.0–45.0)
HEMOGLOBIN: 13.1 g/dL (ref 11.7–15.5)
LYMPHS ABS: 3382 {cells}/uL (ref 850–3900)
MCH: 29.8 pg (ref 27.0–33.0)
MCHC: 33.3 g/dL (ref 32.0–36.0)
MCV: 89.5 fL (ref 80.0–100.0)
MONOS PCT: 8.4 %
MPV: 9.6 fL (ref 7.5–12.5)
Neutro Abs: 3271 cells/uL (ref 1500–7800)
Neutrophils Relative %: 44.2 %
Platelets: 267 10*3/uL (ref 140–400)
RBC: 4.39 10*6/uL (ref 3.80–5.10)
RDW: 12.4 % (ref 11.0–15.0)
Total Lymphocyte: 45.7 %
WBC mixed population: 622 cells/uL (ref 200–950)
WBC: 7.4 10*3/uL (ref 3.8–10.8)

## 2017-07-23 LAB — LIPID PANEL
Cholesterol: 128 mg/dL (ref <200)
HDL: 52 mg/dL (ref >50)
LDL CHOLESTEROL (CALC): 55 mg/dL
NON-HDL CHOLESTEROL (CALC): 76 mg/dL (ref <130)
TRIGLYCERIDES: 133 mg/dL (ref <150)
Total CHOL/HDL Ratio: 2.5 (calc) (ref <5.0)

## 2017-07-23 LAB — HIV ANTIBODY (ROUTINE TESTING W REFLEX): HIV 1&2 Ab, 4th Generation: NONREACTIVE

## 2017-07-23 LAB — HEPATITIS C ANTIBODY
HEP C AB: NONREACTIVE
SIGNAL TO CUT-OFF: 0.01 (ref <1.00)

## 2017-07-23 LAB — TSH: TSH: 0.36 mIU/L — ABNORMAL LOW (ref 0.40–4.50)

## 2017-07-24 ENCOUNTER — Other Ambulatory Visit: Payer: Self-pay | Admitting: *Deleted

## 2017-07-24 DIAGNOSIS — E039 Hypothyroidism, unspecified: Secondary | ICD-10-CM

## 2017-07-28 NOTE — Addendum Note (Signed)
Addended by: Launa Grill on: 07/28/2017 09:57 AM   Modules accepted: Orders

## 2017-07-28 NOTE — Addendum Note (Signed)
Addended by: Ofilia Neas R on: 07/28/2017 10:02 AM   Modules accepted: Orders

## 2017-08-04 DIAGNOSIS — R69 Illness, unspecified: Secondary | ICD-10-CM | POA: Diagnosis not present

## 2017-08-10 ENCOUNTER — Telehealth: Payer: Self-pay | Admitting: Acute Care

## 2017-08-10 DIAGNOSIS — Z122 Encounter for screening for malignant neoplasm of respiratory organs: Secondary | ICD-10-CM

## 2017-08-10 DIAGNOSIS — Z87891 Personal history of nicotine dependence: Secondary | ICD-10-CM

## 2017-08-12 NOTE — Telephone Encounter (Signed)
Left message that Langley Gauss would be back in the office next week and would return her call regarding the lung screening program.

## 2017-08-17 DIAGNOSIS — R69 Illness, unspecified: Secondary | ICD-10-CM | POA: Diagnosis not present

## 2017-08-17 NOTE — Telephone Encounter (Signed)
Spoke with pt and scheduled SDMV 08/19/17 11:30 CT ordered Nothing further needed

## 2017-08-17 NOTE — Telephone Encounter (Signed)
[place] x 1 at home and mobile number

## 2017-08-17 NOTE — Telephone Encounter (Signed)
Returning Denise's call, CB is [phone].

## 2017-08-19 ENCOUNTER — Ambulatory Visit (INDEPENDENT_AMBULATORY_CARE_PROVIDER_SITE_OTHER)
Admission: RE | Admit: 2017-08-19 | Discharge: 2017-08-19 | Disposition: A | Discharge status: Home / Self Care | Payer: Medicare HMO | Source: Ambulatory Visit | Attending: Acute Care | Admitting: Acute Care

## 2017-08-19 ENCOUNTER — Ambulatory Visit (INDEPENDENT_AMBULATORY_CARE_PROVIDER_SITE_OTHER): Payer: Medicare HMO | Admitting: Acute Care

## 2017-08-19 ENCOUNTER — Encounter: Payer: Self-pay | Admitting: Acute Care

## 2017-08-19 DIAGNOSIS — Z87891 Personal history of nicotine dependence: Secondary | ICD-10-CM | POA: Diagnosis not present

## 2017-08-19 DIAGNOSIS — Z122 Encounter for screening for malignant neoplasm of respiratory organs: Secondary | ICD-10-CM

## 2017-08-19 NOTE — Progress Notes (Signed)
Shared Decision Making Visit Lung Cancer Screening Program [phone])   Eligibility:  Age 56 y.o.  Pack Years Smoking History Calculation 66 pack year smoking history (# packs/per year x # years smoked)  Recent History of coughing up blood  no  Unexplained weight loss? no ( >Than 15 pounds within the last 6 months )  Prior History Lung / other cancer no (Diagnosis within the last 5 years already requiring surveillance chest CT Scans).  Smoking Status Former Smoker  Former Smokers: Years since quit: 11 years  Quit Date: 2007  Visit Components:  Discussion included one or more decision making aids. yes  Discussion included risk/benefits of screening. yes  Discussion included potential follow up diagnostic testing for abnormal scans. yes  Discussion included meaning and risk of over diagnosis. yes  Discussion included meaning and risk of False Positives. yes  Discussion included meaning of total radiation exposure. yes  Counseling Included:  Importance of adherence to annual lung cancer LDCT screening. yes  Impact of comorbidities on ability to participate in the program. yes  Ability and willingness to under diagnostic treatment. yes  Smoking Cessation Counseling:  Current Smokers:   Discussed importance of smoking cessation. NA  Information about tobacco cessation classes and interventions provided to patient. yes  Patient provided with "ticket" for LDCT Scan. yes  Symptomatic Patient. no  Counseling  Diagnosis Code: Tobacco Use Z72.0  Asymptomatic Patient yes  Counseling (Intermediate counseling: > three minutes counseling) P3295  Former Smokers:   Discussed the importance of maintaining cigarette abstinence. yes  Diagnosis Code: Personal History of Nicotine Dependence. J88.416  Information about tobacco cessation classes and interventions provided to patient. Yes  Patient provided with "ticket" for LDCT Scan. yes  Written Order for Lung Cancer  Screening with LDCT placed in Epic. Yes (CT Chest Lung Cancer Screening Low Dose W/O CM) [id number] Z12.2-Screening of respiratory organs Z87.891-Personal history of nicotine dependence  I spent 25 minutes of face to face time with [name]  discussing the risks and benefits of lung cancer screening. We viewed a power point together that explained in detail the above noted topics. We took the time to pause the power point at intervals to allow for questions to be asked and answered to ensure understanding. We discussed that sge had taken the single most powerful action possible to decrease her risk of developing lung cancer when she quit smoking. I counseled her to remain smoke free, and to contact me if she ever had the desire to smoke again so that I can provide resources and tools to help support the effort to remain smoke free. We discussed the time and location of the scan, and that either  Doroteo Glassman RN or I will call with the results within  24-48 hours of receiving them. She  has my card and contact information in the event she needs to speak with me, in addition to a copy of the power point we reviewed as a resource. She  verbalized understanding of all of the above and had no further questions upon leaving the office.     I explained to the patient that there has been a high incidence of coronary artery disease noted on these exams. I explained that this is a non-gated exam therefore degree or severity cannot be determined. This patient is on statin therapy. I have asked the patient to follow-up with their PCP regarding any incidental finding of coronary artery disease and management with diet or medication as they feel  is clinically indicated. The patient verbalized understanding of the above and had no further questions.     Magdalen Spatz, NP 08/19/2017

## 2017-08-25 DIAGNOSIS — R69 Illness, unspecified: Secondary | ICD-10-CM | POA: Diagnosis not present

## 2017-08-27 ENCOUNTER — Other Ambulatory Visit: Payer: Self-pay | Admitting: Acute Care

## 2017-08-27 ENCOUNTER — Encounter: Payer: Self-pay | Admitting: Family Medicine

## 2017-08-27 DIAGNOSIS — Z122 Encounter for screening for malignant neoplasm of respiratory organs: Secondary | ICD-10-CM

## 2017-08-27 DIAGNOSIS — Z87891 Personal history of nicotine dependence: Secondary | ICD-10-CM

## 2017-09-21 ENCOUNTER — Ambulatory Visit: Payer: BLUE CROSS/BLUE SHIELD | Admitting: Cardiology

## 2017-09-21 ENCOUNTER — Ambulatory Visit (INDEPENDENT_AMBULATORY_CARE_PROVIDER_SITE_OTHER): Payer: Medicare HMO | Admitting: Family Medicine

## 2017-09-21 ENCOUNTER — Encounter: Payer: Self-pay | Admitting: Family Medicine

## 2017-09-21 VITALS — BP 122/78 | Temp 98.0°F | Ht 61.0 in | Wt 159.2 lb

## 2017-09-21 DIAGNOSIS — J019 Acute sinusitis, unspecified: Secondary | ICD-10-CM | POA: Diagnosis not present

## 2017-09-21 MED ORDER — PREDNISONE 20 MG PO TABS: ORAL_TABLET | ORAL | 0 refills | Status: DC

## 2017-09-21 MED ORDER — AMOXICILLIN-POT CLAVULANATE 875-125 MG PO TABS: 1.0000 | ORAL_TABLET | Freq: Two times a day (BID) | ORAL | 0 refills | Status: DC
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
## 2017-10-02 ENCOUNTER — Encounter: Payer: Self-pay | Admitting: *Deleted

## 2017-10-02 NOTE — Progress Notes (Deleted)
Cardiology Office Note  Date: 10/02/2017   ID: [name], DOB 1961-02-15, MRN [id number]  PCP: Kathyrn Drown, MD  Primary Cardiologist: Rozann Lesches, MD   No chief complaint on file.   History of Present Illness: [name] is a 57 y.o. female last seen in June 2017.  Follow-up Myoview from 2015 was low risk as outlined below.  Past Medical History:  Diagnosis Date  . Coronary atherosclerosis of native coronary artery    DES RCA 5/09, nonobstructive left system  . Essential hypertension, benign   . Hypothyroidism   . Mixed hyperlipidemia   . NSTEMI (non-ST elevated myocardial infarction) ([facility])    2009    Past Surgical History:  Procedure Laterality Date  . BREAST LUMPECTOMY    . TOTAL ABDOMINAL HYSTERECTOMY      Current Outpatient Medications  Medication Sig Dispense Refill  . albuterol (PROVENTIL) (2.5 MG/3ML) 0.083% nebulizer solution Take 3 mLs (2.5 mg total) by nebulization every 4 (four) hours as needed for wheezing. 75 mL 12  . albuterol (VENTOLIN HFA) 108 (90 Base) MCG/ACT inhaler INHALE TWO PUFFS BY MOUTH EVERY 6 HOURS AS NEEDED FOR WHEEZING OR SHORTNESS OF BREATH 18 each 5  . amoxicillin-clavulanate (AUGMENTIN) 875-125 MG tablet Take 1 tablet by mouth 2 (two) times daily. 20 tablet 0  . aspirin 81 MG tablet Take 81 mg by mouth daily.      . budesonide-formoterol (SYMBICORT) 160-4.5 MCG/ACT inhaler Inhale 2 puffs into the lungs 2 (two) times daily. 1 Inhaler 5  . levothyroxine (SYNTHROID, LEVOTHROID) 125 MCG tablet TAKE 1/2 TABLET BY MOUTH EVERY MONDAY AND 1 TABLET ONCE DAILY ON ALL OTHER DAYS 90 tablet 1  . metoprolol tartrate (LOPRESSOR) 25 MG tablet TAKE ONE-HALF TABLET BY MOUTH TWICE DAILY 90 tablet 1  . nitroGLYCERIN (NITROSTAT) 0.4 MG SL tablet Place 1 tablet (0.4 mg total) under the tongue every 5 (five) minutes x 3 doses as needed. 25 tablet 3  . predniSONE (DELTASONE) 20 MG tablet 3qd for 3d then 2qd for 3d then 1qd for 3d 18 tablet 0    . simvastatin (ZOCOR) 40 MG tablet TAKE ONE TABLET BY MOUTH AT BEDTIME 90 tablet 1  . umeclidinium bromide (INCRUSE ELLIPTA) 62.5 MCG/INH AEPB Inhale 1 puff into the lungs daily. 30 each 5   No current facility-administered medications for this visit.    Allergies:  Patient has no known allergies.   Social History: The patient  reports that she quit smoking about 12 years ago. Her smoking use included cigarettes. She started smoking about 44 years ago. She has a 66.00 pack-year smoking history. she has never used smokeless tobacco. She reports that she does not drink alcohol or use drugs.   Family History: The patient's family history includes Cancer in her brother; Pneumonia in her brother and brother.   ROS:  Please see the history of present illness. Otherwise, complete review of systems is positive for {NONE DEFAULTED:18576::"none"}.  All other systems are reviewed and negative.   Physical Exam: VS:  There were no vitals taken for this visit., BMI There is no height or weight on file to calculate BMI.  Wt Readings from Last 3 Encounters:  09/21/17 159 lb 3.2 oz (72.2 kg)  07/21/17 157 lb 12.8 oz (71.6 kg)  07/17/17 155 lb (70.3 kg)    General: Patient appears comfortable at rest. HEENT: Conjunctiva and lids normal, oropharynx clear with moist mucosa. Neck: Supple, no elevated JVP or carotid bruits, no thyromegaly.  Lungs: Clear to auscultation, nonlabored breathing at rest. Cardiac: Regular rate and rhythm, no S3 or significant systolic murmur, no pericardial rub. Abdomen: Soft, nontender, no hepatomegaly, bowel sounds present, no guarding or rebound. Extremities: No pitting edema, distal pulses 2+. Skin: Warm and dry. Musculoskeletal: No kyphosis. Neuropsychiatric: Alert and oriented x3, affect grossly appropriate.  ECG: I personally reviewed the tracing from 08/14/2015 which showed sinus rhythm with nonspecific T-wave changes.  Recent Labwork: 07/22/2017: ALT 12; AST 12; BUN  15; Creat 0.79; Hemoglobin 13.1; Platelets 267; Potassium 4.1; Sodium 143; TSH 0.36     Component Value Date/Time   CHOL 128 07/22/2017 0748   CHOL 155 01/08/2016 0809   TRIG 133 07/22/2017 0748   HDL 52 07/22/2017 0748   HDL 53 01/08/2016 0809   CHOLHDL 2.5 07/22/2017 0748   VLDL 22 01/20/2017 0724   LDLCALC 65 01/20/2017 0724   LDLCALC 63 01/08/2016 0809    Other Studies Reviewed Today:  Lexiscan Cardiolite 08/03/2014: FINDINGS: Baseline tracing shows sinus rhythm at 68 beats per min. Lexiscan bolus was given in standard fashion. Heart rate increased from 61 beats per min up to 89 beats per min, and blood pressure increased from 110/67 up to 118/71. There were no diagnostic ST segment changes, and no arrhythmias were noted.  Analysis of the overall perfusion data shows adequate radiotracer uptake within the myocardium.  Perfusion: There is a very small, fixed inferoapical defect that is suggestive of soft tissue attenuation. No large ischemic zones noted.  Wall Motion: Normal left ventricular wall motion. No left ventricular dilation.  Left Ventricular Ejection Fraction: 67 %  End diastolic volume 65 ml  End systolic volume 21 ml  IMPRESSION: 1. Normal study. Soft tissue attenuation noted without clear evidence of scar or ischemia.  2. Normal left ventricular wall motion.  3. Left ventricular ejection fraction 67%  4. Low-risk stress test findings*.  Assessment and Plan:    Current medicines were reviewed with the patient today.  No orders of the defined types were placed in this encounter.   Disposition:  Signed, Satira Sark, MD, [facility] 10/02/2017 3:38 PM    [facility] [place], [place], [place] [address] Phone: [phone]; Fax: [phone]

## 2017-10-05 ENCOUNTER — Ambulatory Visit: Payer: BLUE CROSS/BLUE SHIELD | Admitting: Cardiology

## 2017-10-13 ENCOUNTER — Other Ambulatory Visit: Payer: Self-pay | Admitting: Family Medicine

## 2017-10-29 ENCOUNTER — Other Ambulatory Visit: Payer: Self-pay | Admitting: Family Medicine

## 2017-10-29 DIAGNOSIS — R5383 Other fatigue: Secondary | ICD-10-CM | POA: Diagnosis not present

## 2017-10-29 DIAGNOSIS — E039 Hypothyroidism, unspecified: Secondary | ICD-10-CM | POA: Diagnosis not present

## 2017-10-29 LAB — T4, FREE: FREE T4: 1.5 ng/dL (ref 0.8–1.8)

## 2017-10-29 LAB — TSH: TSH: 2.07 m[IU]/L (ref 0.40–4.50)

## 2017-10-30 ENCOUNTER — Encounter: Payer: Self-pay | Admitting: Family Medicine

## 2017-11-03 ENCOUNTER — Ambulatory Visit (INDEPENDENT_AMBULATORY_CARE_PROVIDER_SITE_OTHER): Payer: Medicare HMO | Admitting: Family Medicine

## 2017-11-03 ENCOUNTER — Encounter: Payer: Self-pay | Admitting: Family Medicine

## 2017-11-03 VITALS — BP 140/88 | HR 78 | Temp 99.6°F | Ht 61.0 in | Wt 158.0 lb

## 2017-11-03 DIAGNOSIS — J441 Chronic obstructive pulmonary disease with (acute) exacerbation: Secondary | ICD-10-CM | POA: Diagnosis not present

## 2017-11-03 DIAGNOSIS — J019 Acute sinusitis, unspecified: Secondary | ICD-10-CM | POA: Diagnosis not present

## 2017-11-03 MED ORDER — AMOXICILLIN-POT CLAVULANATE 875-125 MG PO TABS
1.0000 | ORAL_TABLET | Freq: Two times a day (BID) | ORAL | 0 refills | Status: DC
Start: 2017-11-03 — End: 2017-12-04

## 2017-11-03 MED ORDER — PREDNISONE 20 MG PO TABS: ORAL_TABLET | ORAL | 0 refills | Status: DC

## 2017-11-03 NOTE — Progress Notes (Signed)
   Subjective:    Patient ID: [name], female    DOB: 1961-06-14, 57 y.o.   MRN: [id number]  HPI  Patient is here today with complaints of  Cough and congestion,sorethraot,headache,wheezing since Friday. Has been using albuterol and otc cold and cough.   Review of Systems  Constitutional: Positive for fatigue. Negative for activity change and fever.  HENT: Positive for congestion and rhinorrhea. Negative for ear pain.   Eyes: Negative for discharge.  Respiratory: Positive for cough and wheezing. Negative for shortness of breath.   Cardiovascular: Negative for chest pain.       Objective:   Physical Exam  Constitutional: She appears well-developed.  HENT:  Head: Normocephalic.  Right Ear: External ear normal.  Left Ear: External ear normal.  Nose: Nose normal.  Mouth/Throat: Oropharynx is clear and moist. No oropharyngeal exudate.  Eyes: Right eye exhibits no discharge. Left eye exhibits no discharge.  Neck: Neck supple. No tracheal deviation present.  Cardiovascular: Normal rate and normal heart sounds.  No murmur heard. Pulmonary/Chest: Effort normal. She has wheezes. She has no rales.  Lymphadenopathy:    She has no cervical adenopathy.  Skin: Skin is warm and dry.  Nursing note and vitals reviewed.         Assessment & Plan:  No respiratory distress COPD flareup Acute rhinosinusitis Antibiotics prednisone albuterol on a regular basis O2 saturation 94% If worse go to ER follow-up

## 2017-11-04 NOTE — Progress Notes (Signed)
Cardiology Office Note  Date: 11/06/2017   ID: [name], DOB Nov 10, 1960, MRN [id number]  PCP: Kathyrn Drown, MD  Primary Cardiologist: Rozann Lesches, MD   Chief Complaint  Patient presents with  . Coronary Artery Disease    History of Present Illness: [name] is a 57 y.o. female last seen in June 2017.  She presents overdue for follow-up.  From a cardiac perspective, she reports occasional angina symptoms, none in the last few months however. She was seen just recently by Dr. Wolfgang Phoenix with COPD exacerbation and acute rhinosinusitis.  Still has cough and chest congestion.  I went over her cardiac medications which include aspirin, Lopressor, Zocor, and as needed nitroglycerin.  LDL was 65 by lab work last year.  I personally reviewed her ECG today which shows normal sinus rhythm.  She underwent a low risk Myoview in November 2015.  Past Medical History:  Diagnosis Date  . Coronary atherosclerosis of native coronary artery    DES RCA 5/09, nonobstructive left system  . Essential hypertension, benign   . Hypothyroidism   . Mixed hyperlipidemia   . NSTEMI (non-ST elevated myocardial infarction) ([facility])    2009    Past Surgical History:  Procedure Laterality Date  . BREAST LUMPECTOMY    . TOTAL ABDOMINAL HYSTERECTOMY      Current Outpatient Medications  Medication Sig Dispense Refill  . albuterol (PROVENTIL) (2.5 MG/3ML) 0.083% nebulizer solution Take 3 mLs (2.5 mg total) by nebulization every 4 (four) hours as needed for wheezing. 75 mL 12  . albuterol (VENTOLIN HFA) 108 (90 Base) MCG/ACT inhaler INHALE TWO PUFFS BY MOUTH EVERY 6 HOURS AS NEEDED FOR WHEEZING OR SHORTNESS OF BREATH 18 each 5  . amoxicillin-clavulanate (AUGMENTIN) 875-125 MG tablet Take 1 tablet by mouth 2 (two) times daily. 20 tablet 0  . aspirin 81 MG tablet Take 81 mg by mouth daily.      . budesonide-formoterol (SYMBICORT) 160-4.5 MCG/ACT inhaler Inhale 2 puffs into the lungs 2 (two)  times daily. 1 Inhaler 5  . levothyroxine (SYNTHROID, LEVOTHROID) 125 MCG tablet TAKE 1/2 TABLET BY MOUTH EVERY MONDAY AND 1 TABLET ONCE DAILY ON ALL OTHER DAYS 90 tablet 1  . metoprolol tartrate (LOPRESSOR) 25 MG tablet TAKE ONE-HALF TABLET BY MOUTH TWICE DAILY 90 tablet 1  . nitroGLYCERIN (NITROSTAT) 0.4 MG SL tablet Place 1 tablet (0.4 mg total) under the tongue every 5 (five) minutes x 3 doses as needed. 25 tablet 3  . predniSONE (DELTASONE) 20 MG tablet 3qd for 3d then 2qd for 3d then 1qd for 3d 18 tablet 0  . simvastatin (ZOCOR) 40 MG tablet TAKE ONE TABLET BY MOUTH AT BEDTIME 90 tablet 1  . umeclidinium bromide (INCRUSE ELLIPTA) 62.5 MCG/INH AEPB Inhale 1 puff into the lungs daily. 30 each 5   No current facility-administered medications for this visit.    Allergies:  Patient has no known allergies.   Social History: The patient  reports that she quit smoking about 12 years ago. Her smoking use included cigarettes. She started smoking about 45 years ago. She has a 66.00 pack-year smoking history. she has never used smokeless tobacco. She reports that she does not drink alcohol or use drugs.   ROS:  Please see the history of present illness. Otherwise, complete review of systems is positive for cough and chest congestion.  All other systems are reviewed and negative.   Physical Exam: VS:  BP 120/68   Pulse 79   Ht  5\' 1"  (1.549 m)   Wt 157 lb 9.6 oz (71.5 kg)   SpO2 96%   BMI 29.78 kg/m , BMI Body mass index is 29.78 kg/m.  Wt Readings from Last 3 Encounters:  11/06/17 157 lb 9.6 oz (71.5 kg)  11/03/17 158 lb (71.7 kg)  09/21/17 159 lb 3.2 oz (72.2 kg)    General: Patient appears comfortable at rest. HEENT: Conjunctiva and lids normal, oropharynx clear. Neck: Supple, no elevated JVP or carotid bruits, no thyromegaly. Lungs: Scattered rhonchi without wheezing, nonlabored breathing at rest. Cardiac: Regular rate and rhythm, no S3 or significant systolic murmur, no pericardial  rub. Abdomen: Soft, nontender, bowel sounds present. Extremities: No pitting edema, distal pulses 2+. Skin: Warm and dry. Musculoskeletal: No kyphosis. Neuropsychiatric: Alert and oriented x3, affect grossly appropriate.  ECG: I personally reviewed the tracing from 08/14/2015 which showed sinus rhythm with nonspecific T-wave changes.  Recent Labwork: 07/22/2017: ALT 12; AST 12; BUN 15; Creat 0.79; Hemoglobin 13.1; Platelets 267; Potassium 4.1; Sodium 143 10/29/2017: TSH 2.07     Component Value Date/Time   CHOL 128 07/22/2017 0748   CHOL 155 01/08/2016 0809   TRIG 133 07/22/2017 0748   HDL 52 07/22/2017 0748   HDL 53 01/08/2016 0809   CHOLHDL 2.5 07/22/2017 0748   VLDL 22 01/20/2017 0724   LDLCALC 65 01/20/2017 0724   LDLCALC 63 01/08/2016 0809    Other Studies Reviewed Today:  Lexiscan Cardiolite 08/03/2014: IMPRESSION: 1. Normal study. Soft tissue attenuation noted without clear evidence of scar or ischemia.  2. Normal left ventricular wall motion.  3. Left ventricular ejection fraction 67%  4. Low-risk stress test findings*.  Assessment and Plan:  1.  CAD with history of DES to the RCA in 2009.  She underwent a low risk Myoview study in November 2015.  He does report occasional angina symptoms.  ECG is normal today.  Anticipate continue medical therapy and we will discuss arranging a follow-up ischemic evaluation over the next year.  2.  Mixed hyperlipidemia, continues on Zocor with last LDL 65.  3.  Essential hypertension, blood pressure is well controlled today.  4.  Left upper lobe lung nodule, 6.7 mm by chest CT in November 2018.  She follows with Pulmonary.  Current medicines were reviewed with the patient today.   Orders Placed This Encounter  Procedures  . EKG 12-Lead    Disposition: Follow-up in 6 months.  Signed, Satira Sark, MD, [facility] 11/06/2017 8:54 AM    [facility] at [place], [place], [place]  [address] Phone: [phone]; Fax: [phone]

## 2017-11-06 ENCOUNTER — Ambulatory Visit: Payer: Medicare HMO | Admitting: Cardiology

## 2017-11-06 ENCOUNTER — Encounter: Payer: Self-pay | Admitting: Cardiology

## 2017-11-06 VITALS — BP 120/68 | HR 79 | Ht 61.0 in | Wt 157.6 lb

## 2017-11-06 DIAGNOSIS — R911 Solitary pulmonary nodule: Secondary | ICD-10-CM

## 2017-11-06 DIAGNOSIS — E782 Mixed hyperlipidemia: Secondary | ICD-10-CM | POA: Diagnosis not present

## 2017-11-06 DIAGNOSIS — I25119 Atherosclerotic heart disease of native coronary artery with unspecified angina pectoris: Secondary | ICD-10-CM | POA: Diagnosis not present

## 2017-11-06 DIAGNOSIS — I10 Essential (primary) hypertension: Secondary | ICD-10-CM | POA: Diagnosis not present

## 2017-11-06 NOTE — Patient Instructions (Signed)

## 2017-12-04 ENCOUNTER — Telehealth: Payer: Self-pay | Admitting: *Deleted

## 2017-12-04 DIAGNOSIS — R079 Chest pain, unspecified: Secondary | ICD-10-CM

## 2017-12-04 NOTE — Telephone Encounter (Signed)
Chest pain 3 weeks ago - stated that she did have to take 2 Ntg before she got relief.  Did not feel that she needed to go to the emergency room.  Happened again last Saturday - took Ntg x 1 that time & layed down & pain eased off.  Pain lasted 5 or minutes each time.  No active chest pain currently.  Does have SOB all the time though due to her COPD - that has not changed.  No dizziness or weight gain.

## 2017-12-04 NOTE — Telephone Encounter (Signed)
Let's go ahead and schedule a Lexiscan Myoview to assess ischemic burden.  We did discuss follow-up stress testing subsequent to her last visit.  Still might be able to manage her medically depending on results.  Imdur would be a consideration.

## 2017-12-07 ENCOUNTER — Telehealth: Payer: Self-pay | Admitting: Cardiovascular Disease

## 2017-12-07 NOTE — Telephone Encounter (Signed)
Patient notified & agrees to doing stress test.  Instructions given, order placed, & sent to [facility] for scheduling.

## 2017-12-07 NOTE — Telephone Encounter (Signed)
LEXISCAN Scheduled at [facility] December 15, 2017 Arrive at 10:00

## 2017-12-08 ENCOUNTER — Encounter: Payer: Self-pay | Admitting: *Deleted

## 2017-12-15 ENCOUNTER — Encounter (HOSPITAL_BASED_OUTPATIENT_CLINIC_OR_DEPARTMENT_OTHER)
Admission: RE | Admit: 2017-12-15 | Discharge: 2017-12-15 | Disposition: A | Discharge status: Home / Self Care | Payer: Medicare HMO | Source: Ambulatory Visit | Attending: Cardiology | Admitting: Cardiology

## 2017-12-15 ENCOUNTER — Encounter (HOSPITAL_COMMUNITY)
Admission: RE | Admit: 2017-12-15 | Discharge: 2017-12-15 | Disposition: A | Discharge status: Home / Self Care | Payer: Medicare HMO | Source: Ambulatory Visit | Attending: Cardiology | Admitting: Cardiology

## 2017-12-15 ENCOUNTER — Encounter (HOSPITAL_COMMUNITY): Payer: Self-pay

## 2017-12-15 DIAGNOSIS — R079 Chest pain, unspecified: Secondary | ICD-10-CM | POA: Insufficient documentation

## 2017-12-15 LAB — NM MYOCAR MULTI W/SPECT W/WALL MOTION / EF
CHL CUP RESTING HR STRESS: 62 {beats}/min
CSEPPHR: 94 {beats}/min
LVDIAVOL: 63 mL (ref 46–106)
LVSYSVOL: 24 mL
NUC STRESS TID: 1
RATE: 0.28
SDS: 0
SRS: 0
SSS: 0

## 2017-12-15 MED ORDER — TECHNETIUM TC 99M TETROFOSMIN IV KIT
10.0000 | PACK | Freq: Once | INTRAVENOUS | Status: AC | PRN
  Administered 2017-12-15: 11 via INTRAVENOUS

## 2017-12-15 MED ORDER — TECHNETIUM TC 99M TETROFOSMIN IV KIT
30.0000 | PACK | Freq: Once | INTRAVENOUS | Status: AC | PRN
  Administered 2017-12-15: 32 via INTRAVENOUS

## 2017-12-15 MED ORDER — SODIUM CHLORIDE 0.9% FLUSH
INTRAVENOUS | Status: AC
  Administered 2017-12-15: 10 mL via INTRAVENOUS
  Filled 2017-12-15: qty 10

## 2017-12-15 MED ORDER — REGADENOSON 0.4 MG/5ML IV SOLN
INTRAVENOUS | Status: AC
  Administered 2017-12-15: 0.4 mg via INTRAVENOUS
  Filled 2017-12-15: qty 5

## 2017-12-16 ENCOUNTER — Telehealth: Payer: Self-pay

## 2017-12-16 NOTE — Telephone Encounter (Signed)
Patient notified. Routed to PCP 

## 2017-12-16 NOTE — Telephone Encounter (Signed)
-----   Message from Acquanetta Chain, LPN sent at 1/43/8887 10:06 AM EDT -----   ----- Message ----- From: Satira Sark, MD Sent: 12/15/2017   5:03 PM To: Kathyrn Drown, MD, Merlene Laughter, LPN  Results reviewed.  Please let her know that the stress test was low risk, no clear evidence of progressive CAD.  Continue with medical therapy. A copy of this test should be forwarded to Kathyrn Drown, MD.

## 2018-01-09 ENCOUNTER — Other Ambulatory Visit: Payer: Self-pay | Admitting: Family Medicine

## 2018-01-18 ENCOUNTER — Encounter: Payer: Self-pay | Admitting: Family Medicine

## 2018-01-18 ENCOUNTER — Ambulatory Visit (INDEPENDENT_AMBULATORY_CARE_PROVIDER_SITE_OTHER): Payer: Medicare HMO | Admitting: Family Medicine

## 2018-01-18 VITALS — BP 120/78 | Ht 61.0 in | Wt 162.1 lb

## 2018-01-18 DIAGNOSIS — R911 Solitary pulmonary nodule: Secondary | ICD-10-CM | POA: Diagnosis not present

## 2018-01-18 DIAGNOSIS — Z1211 Encounter for screening for malignant neoplasm of colon: Secondary | ICD-10-CM

## 2018-01-18 DIAGNOSIS — D692 Other nonthrombocytopenic purpura: Secondary | ICD-10-CM | POA: Diagnosis not present

## 2018-01-18 DIAGNOSIS — E782 Mixed hyperlipidemia: Secondary | ICD-10-CM | POA: Diagnosis not present

## 2018-01-18 DIAGNOSIS — E038 Other specified hypothyroidism: Secondary | ICD-10-CM

## 2018-01-18 DIAGNOSIS — J449 Chronic obstructive pulmonary disease, unspecified: Secondary | ICD-10-CM

## 2018-01-18 DIAGNOSIS — I10 Essential (primary) hypertension: Secondary | ICD-10-CM | POA: Diagnosis not present

## 2018-01-18 DIAGNOSIS — R5383 Other fatigue: Secondary | ICD-10-CM

## 2018-01-18 DIAGNOSIS — Z79899 Other long term (current) drug therapy: Secondary | ICD-10-CM | POA: Diagnosis not present

## 2018-01-18 DIAGNOSIS — J019 Acute sinusitis, unspecified: Secondary | ICD-10-CM | POA: Diagnosis not present

## 2018-01-18 MED ORDER — METOPROLOL TARTRATE 25 MG PO TABS: 12.5000 mg | ORAL_TABLET | Freq: Two times a day (BID) | ORAL | 1 refills | Status: DC

## 2018-01-18 MED ORDER — DOXYCYCLINE HYCLATE 100 MG PO TABS: 100.0000 mg | ORAL_TABLET | Freq: Two times a day (BID) | ORAL | 0 refills | Status: AC

## 2018-01-18 MED ORDER — LEVOTHYROXINE SODIUM 125 MCG PO TABS: ORAL_TABLET | ORAL | 1 refills | Status: DC

## 2018-01-18 MED ORDER — ALBUTEROL SULFATE HFA 108 (90 BASE) MCG/ACT IN AERS: INHALATION_SPRAY | RESPIRATORY_TRACT | 5 refills | Status: DC

## 2018-01-18 NOTE — Progress Notes (Signed)
Subjective:    Patient ID: [name], female    DOB: Nov 22, 1960, 57 y.o.   MRN: [id number]  HPI Patient is here today to follow up on Htn.She is on Metoprolol 25 half tablet bid. She eats pretty healthy. She does see Pulmonary Dr. She does gets some exercise.  Patient has thyroid condition.  Takes thyroid medication on a regular basis.  States that the proper way.  Relates compliance.  States no negative side effects.  States condition seems to be under good control.  Patient here for follow-up regarding cholesterol.  Patient does try to maintain a reasonable diet.  Patient does take the medication on a regular basis.  Denies missing a dose.  The patient denies any obvious side effects.  Prior blood work results reviewed with the patient.  The patient is aware of his cholesterol goals and the need to keep it under good control to lessen the risk of disease.  Patient does have COPD she uses her medications on a regular basis she is using some of her family members medication because she cannot afford her own she relates is under good control  Patient also has pulmonary nodules she is due for a CAT scan at the end of May for follow-up on previous findings  She also has bruising on her arms but does use low-dose aspirin on a regular basis has a history of a coronary artery disease and a stent denies any chest tightness pressure pain  Patient does relate some intermittent fatigue and tiredness but probably related to being busy  Patient is due for colonoscopy she is 1 year behind  Significant sinus symptoms over the past week head congestion coughing bringing up discolored phlegm Review of Systems  Constitutional: Negative for activity change, fatigue and fever.  HENT: Negative for congestion.   Respiratory: Negative for cough, chest tightness and shortness of breath.   Cardiovascular: Negative for chest pain and leg swelling.  Gastrointestinal: Negative for abdominal pain.  Skin:  Negative for color change.  Neurological: Negative for headaches.  Psychiatric/Behavioral: Negative for behavioral problems.       Objective:   Physical Exam  Constitutional: She appears well-nourished. No distress.  Cardiovascular: Normal rate, regular rhythm and normal heart sounds.  No murmur heard. Pulmonary/Chest: Effort normal and breath sounds normal. No respiratory distress.  Musculoskeletal: She exhibits no edema.  Lymphadenopathy:    She has no cervical adenopathy.  Neurological: She is alert. She exhibits normal muscle tone.  Psychiatric: Her behavior is normal.  Vitals reviewed.  Senile purpura       Assessment & Plan:  Colonoscopy-Dr Britta Mccreedy  The patient was seen today as part of an evaluation regarding hyperlipidemia.  Recent lab work has been reviewed with the patient as well as the goals for good cholesterol care.  In addition to this medications have been discussed the importance of compliance with diet and medications discussed as well.  Finally the patient is aware that poor control of cholesterol, noncompliance can dramatically increase her risk of heart attack strokes and premature death.  The patient will keep regular office visits and the patient does agreed to periodic lab work.  COPD continue inhalers.  Please see medical list.  Pulmonary nodule follow-up CT scan at the end of May will send notification to Anselm Lis NP  Senile purpura normal for age related to aspirin  Cardiac disease stable  Blood pressure good control watch diet stick with medicine  Hypothyroidism continue current medications  Recent  lab work reviewed new lab work ordered to have this completed before next visit  25 minutes was spent with the patient.  This statement verifies that 25 minutes was indeed spent with the patient. Greater than half the time was spent in discussion, counseling and answering questions  regarding the issues that the patient came in for today as  reflected in the diagnosis (s) please refer to documentation for further details.

## 2018-01-19 ENCOUNTER — Telehealth: Payer: Self-pay | Admitting: Family Medicine

## 2018-01-19 NOTE — Telephone Encounter (Signed)
Please inform the patient that we did speak with [name] they will be ordering her CAT scan and calling her regarding this somewhere within the next few weekse-

## 2018-01-20 NOTE — Telephone Encounter (Signed)
Patient notified per Dr Colette Ribas spoke with [facility] and  they will be ordering her CAT scan and calling her regarding this somewhere within the next few weeks. Patient verbalized understanding and stated she will let us know when it is scheduled.

## 2018-01-26 ENCOUNTER — Telehealth: Payer: Self-pay | Admitting: Family Medicine

## 2018-01-26 NOTE — Telephone Encounter (Signed)
FYI

## 2018-01-26 NOTE — Telephone Encounter (Signed)
Patient wanted to let Dr. Nicki Reaper know that she has been waiting on the appointment for her lung scan.  She said she scheduled for 02/17/18.  She said she will call and let Dr. Nicki Reaper know as soon as she has it.

## 2018-01-28 ENCOUNTER — Telehealth: Payer: Self-pay | Admitting: Family Medicine

## 2018-01-28 ENCOUNTER — Encounter (INDEPENDENT_AMBULATORY_CARE_PROVIDER_SITE_OTHER): Payer: Self-pay | Admitting: *Deleted

## 2018-01-28 NOTE — Telephone Encounter (Signed)
Spoke with patient, she would like to stay local due to Dr. Britta Mccreedy has moved away  Will refer to Dr. Laural Golden

## 2018-01-28 NOTE — Telephone Encounter (Signed)
[facility] - Dr. Britta Mccreedy is no longer with [facility] center, left message for pt to ask if still wants to go to [facility] center or elsewhere

## 2018-02-17 ENCOUNTER — Ambulatory Visit (HOSPITAL_COMMUNITY)
Admission: RE | Admit: 2018-02-17 | Discharge: 2018-02-17 | Disposition: A | Discharge status: Home / Self Care | Payer: Medicare HMO | Source: Ambulatory Visit | Attending: Acute Care | Admitting: Acute Care

## 2018-02-17 DIAGNOSIS — Z87891 Personal history of nicotine dependence: Secondary | ICD-10-CM | POA: Insufficient documentation

## 2018-02-17 DIAGNOSIS — I700 Atherosclerosis of aorta: Secondary | ICD-10-CM | POA: Insufficient documentation

## 2018-02-17 DIAGNOSIS — I288 Other diseases of pulmonary vessels: Secondary | ICD-10-CM | POA: Diagnosis not present

## 2018-02-17 DIAGNOSIS — I2510 Atherosclerotic heart disease of native coronary artery without angina pectoris: Secondary | ICD-10-CM | POA: Insufficient documentation

## 2018-02-17 DIAGNOSIS — Z122 Encounter for screening for malignant neoplasm of respiratory organs: Secondary | ICD-10-CM | POA: Insufficient documentation

## 2018-02-17 DIAGNOSIS — J439 Emphysema, unspecified: Secondary | ICD-10-CM | POA: Insufficient documentation

## 2018-02-17 DIAGNOSIS — R911 Solitary pulmonary nodule: Secondary | ICD-10-CM | POA: Diagnosis not present

## 2018-02-24 ENCOUNTER — Other Ambulatory Visit: Payer: Self-pay | Admitting: Acute Care

## 2018-02-24 DIAGNOSIS — Z87891 Personal history of nicotine dependence: Secondary | ICD-10-CM

## 2018-02-24 DIAGNOSIS — Z122 Encounter for screening for malignant neoplasm of respiratory organs: Secondary | ICD-10-CM

## 2018-03-11 ENCOUNTER — Ambulatory Visit (INDEPENDENT_AMBULATORY_CARE_PROVIDER_SITE_OTHER): Payer: Medicare HMO | Admitting: Nurse Practitioner

## 2018-03-11 ENCOUNTER — Encounter: Payer: Self-pay | Admitting: Nurse Practitioner

## 2018-03-11 VITALS — BP 142/88 | Temp 98.1°F | Ht 61.0 in | Wt 160.8 lb

## 2018-03-11 DIAGNOSIS — J019 Acute sinusitis, unspecified: Secondary | ICD-10-CM

## 2018-03-11 DIAGNOSIS — J449 Chronic obstructive pulmonary disease, unspecified: Secondary | ICD-10-CM | POA: Diagnosis not present

## 2018-03-11 MED ORDER — DOXYCYCLINE HYCLATE 100 MG PO TABS: 100.0000 mg | ORAL_TABLET | Freq: Two times a day (BID) | ORAL | 0 refills | Status: DC

## 2018-03-11 MED ORDER — PREDNISONE 20 MG PO TABS: ORAL_TABLET | ORAL | 0 refills | Status: DC

## 2018-03-12 ENCOUNTER — Other Ambulatory Visit (INDEPENDENT_AMBULATORY_CARE_PROVIDER_SITE_OTHER): Payer: Self-pay | Admitting: *Deleted

## 2018-03-12 DIAGNOSIS — Z86010 Personal history of colonic polyps: Secondary | ICD-10-CM | POA: Insufficient documentation

## 2018-03-13 ENCOUNTER — Encounter: Payer: Self-pay | Admitting: Nurse Practitioner

## 2018-03-13 NOTE — Progress Notes (Signed)
Subjective:  Presents for c/o cough and congestion x 2 d. Low grade fever, max temp 100.1. Sore throat better today. Frontal area headache. Runny nose. Occasional cough producing yellow mucus. Flare up of COPD with wheezing. Has been under good control with Incruse. Rare use of albuterol before illness. Used machine 3 x yesterday and again this am. Used rescue inhaler 1-2 times as well. No ear pain.   Objective:   BP (!) 142/88   Temp 98.1 F (36.7 C) (Oral)   Ht 5\' 1"  (1.549 m)   Wt 160 lb 12.8 oz (72.9 kg)   BMI 30.38 kg/m  NAD. Alert, oriented. TMs retracted, no erythema. Pharynx injected with green PND noted. Lungs: breath sounds diminished posterior but clear. Faint expiratory wheezes noted anterior upper lobes. Normal color. Mild SOB with talking. Heart RRR.   Assessment:   Problem List Items Addressed This Visit      Respiratory   COPD (chronic obstructive pulmonary disease) with chronic bronchitis (HCC)   Relevant Medications   predniSONE (DELTASONE) 20 MG tablet    Other Visit Diagnoses    Acute rhinosinusitis    -  Primary   Relevant Medications   doxycycline (VIBRA-TABS) 100 MG tablet   predniSONE (DELTASONE) 20 MG tablet       Plan:   Meds ordered this encounter  Medications  . doxycycline (VIBRA-TABS) 100 MG tablet    Sig: Take 1 tablet (100 mg total) by mouth 2 (two) times daily.    Dispense:  20 tablet    Refill:  0    Order Specific Question:   Supervising Provider    Answer:   Mikey Kirschner [[id number]]  . predniSONE (DELTASONE) 20 MG tablet    Sig: 3 po qd x 3 d then 2 po qd x 3 d then 1 po qd x 2 d    Dispense:  17 tablet    Refill:  0    Order Specific Question:   Supervising Provider    Answer:   Mikey Kirschner [[id number]]   Continue current medications as directed. Warning signs reviewed. Call back in 4 days if no improvement, seek help over the weekend if worse.

## 2018-03-17 ENCOUNTER — Telehealth: Payer: Self-pay | Admitting: Family Medicine

## 2018-03-17 NOTE — Telephone Encounter (Signed)
Patient is going out of town for 10 days and insurance will not pay for her to get her Incruse 62.5 mg filled 3 days early.  She has a sample of Trelegy and wants to know if she can use that and if so will it interact with her other meds like  Ventolin?    [phone]

## 2018-03-17 NOTE — Telephone Encounter (Signed)
She will need to hold on Incruse and Flovent since both ingredients are in Trelegy; she can still use rescue inhaler as directed (albuterol) if needed.

## 2018-03-18 NOTE — Telephone Encounter (Signed)
Patient advised per Hoyle Sauer she will need to hold on Incruse and Flovent since both ingredients are in Trelegy; she can still use rescue inhaler as directed (albuterol) if needed. Patient verbalized understanding.

## 2018-03-18 NOTE — Telephone Encounter (Signed)
Left message to return call 

## 2018-04-08 DIAGNOSIS — Z1231 Encounter for screening mammogram for malignant neoplasm of breast: Secondary | ICD-10-CM | POA: Diagnosis not present

## 2018-04-27 ENCOUNTER — Telehealth (INDEPENDENT_AMBULATORY_CARE_PROVIDER_SITE_OTHER): Payer: Self-pay | Admitting: *Deleted

## 2018-04-27 ENCOUNTER — Encounter (INDEPENDENT_AMBULATORY_CARE_PROVIDER_SITE_OTHER): Payer: Self-pay | Admitting: *Deleted

## 2018-04-27 NOTE — Telephone Encounter (Signed)
Patient needs suprep 

## 2018-04-28 MED ORDER — SUPREP BOWEL PREP KIT 17.5-3.13-1.6 GM/177ML PO SOLN: 1.0000 | Freq: Once | ORAL | 0 refills | Status: AC

## 2018-04-30 ENCOUNTER — Telehealth (INDEPENDENT_AMBULATORY_CARE_PROVIDER_SITE_OTHER): Payer: Self-pay | Admitting: *Deleted

## 2018-04-30 NOTE — Telephone Encounter (Signed)
agree

## 2018-04-30 NOTE — Telephone Encounter (Signed)
Referring MD/PCP: scott luking   Procedure: tcs  Reason/Indication:  Hx polyps  Has patient had this procedure before?  Yes, 2012  If so, when, by whom and where?    Is there a family history of colon cancer?  no  Who?  What age when diagnosed?    Is patient diabetic?   no      Does patient have prosthetic heart valve or mechanical valve?  no  Do you have a pacemaker?  no  Has patient ever had endocarditis? no  Has patient had joint replacement within last 12 months?  no  Is patient constipated or do they take laxatives? no  Does patient have a history of alcohol/drug use?  no  Is patient on blood thinner such as Coumadin, Plavix and/or Aspirin? yes  Medications: asa 81 mg daily, levothyroxine 125 mcg 1/2 tab on Mondays and whole tab the other days, metoprolol 25 mg 1.2 tab bid, incruse 62.5 1 puff daily, flovent 220 mg 2 puffs bid, albuterol prn, pro-air prn, vit d, mvi  Allergies: nkda  Medication Adjustment per Dr Lindi Adie, NP: asa 2 days  Procedure date & time: 05/26/18 at 1030

## 2018-05-10 NOTE — Progress Notes (Signed)
Cardiology Office Note  Date: 05/11/2018   ID: [name], DOB November 06, 1960, MRN [id number]  PCP: Kathyrn Drown, MD  Primary Cardiologist: Rozann Lesches, MD   Chief Complaint  Patient presents with  . Coronary Artery Disease    History of Present Illness: [name] is a 57 y.o. female last seen in February.  She is here for a routine visit.  Reports no angina symptoms or increasing shortness of breath at this time with typical activities.  She has been taking care of a grandchild over the summer.  I reviewed her cardiac medications which are listed below and stable.  She has not used any nitroglycerin in the interim.  She will be seeing Dr. Wolfgang Phoenix for a physical this fall.  Follow-up Lexiscan Myoview in March was low risk with LVEF 62%.  I reviewed the results with her again.  We will continue with observation for now.  Past Medical History:  Diagnosis Date  . Coronary atherosclerosis of native coronary artery    DES RCA 5/09, nonobstructive left system  . Essential hypertension, benign   . Hypothyroidism   . Mixed hyperlipidemia   . NSTEMI (non-ST elevated myocardial infarction) ([place])    2009    Past Surgical History:  Procedure Laterality Date  . BREAST LUMPECTOMY    . TOTAL ABDOMINAL HYSTERECTOMY      Current Outpatient Medications  Medication Sig Dispense Refill  . albuterol (PROVENTIL) (2.5 MG/3ML) 0.083% nebulizer solution Take 3 mLs (2.5 mg total) by nebulization every 4 (four) hours as needed for wheezing. 75 mL 12  . albuterol (VENTOLIN HFA) 108 (90 Base) MCG/ACT inhaler INHALE TWO PUFFS BY MOUTH EVERY 6 HOURS AS NEEDED FOR WHEEZING OR SHORTNESS OF BREATH 18 each 5  . aspirin 81 MG tablet Take 81 mg by mouth daily.      . fluticasone (FLOVENT HFA) 220 MCG/ACT inhaler Inhale into the lungs 2 (two) times daily.    Marland Kitchen levothyroxine (SYNTHROID, LEVOTHROID) 125 MCG tablet TAKE 1/2 TABLET BY MOUTH EVERY MONDAY AND 1 TABLET ONCE DAILY ON ALL OTHER DAYS  90 tablet 1  . metoprolol tartrate (LOPRESSOR) 25 MG tablet Take 0.5 tablets (12.5 mg total) by mouth 2 (two) times daily. 90 tablet 1  . nitroGLYCERIN (NITROSTAT) 0.4 MG SL tablet Place 1 tablet (0.4 mg total) under the tongue every 5 (five) minutes x 3 doses as needed. 25 tablet 3  . simvastatin (ZOCOR) 40 MG tablet TAKE 1 TABLET BY MOUTH EVERYDAY AT BEDTIME 90 tablet 1  . umeclidinium bromide (INCRUSE ELLIPTA) 62.5 MCG/INH AEPB Inhale 1 puff into the lungs daily. (Patient taking differently: Inhale 1 puff into the lungs daily. Not taking currently.) 30 each 5   No current facility-administered medications for this visit.    Allergies:  Patient has no known allergies.   Social History: The patient  reports that she quit smoking about 12 years ago. Her smoking use included cigarettes. She started smoking about 45 years ago. She has a 66.00 pack-year smoking history. She has never used smokeless tobacco. She reports that she does not drink alcohol or use drugs.   ROS:  Please see the history of present illness. Otherwise, complete review of systems is positive for easy bruising on her forearms, shortness of breath related to COPD.  All other systems are reviewed and negative.   Physical Exam: VS:  BP 118/70   Pulse (!) 59   Ht 5\' 1"  (1.549 m)   Wt 160 lb (  72.6 kg)   SpO2 98%   BMI 30.23 kg/m , BMI Body mass index is 30.23 kg/m.  Wt Readings from Last 3 Encounters:  05/11/18 160 lb (72.6 kg)  03/11/18 160 lb 12.8 oz (72.9 kg)  01/18/18 162 lb 0.8 oz (73.5 kg)    General: Patient appears comfortable at rest. HEENT: Conjunctiva and lids normal, oropharynx clear. Neck: Supple, no elevated JVP or carotid bruits, no thyromegaly. Lungs: Decreased breath sounds without wheezing, nonlabored breathing at rest. Cardiac: Regular rate and rhythm, no S3 or significant systolic murmur. Abdomen: Soft, nontender, bowel sounds present. Extremities: No pitting edema, distal pulses 2+. Skin: Warm and  dry.  Resolving ecchymoses on the forearms. Musculoskeletal: No kyphosis. Neuropsychiatric: Alert and oriented x3, affect grossly appropriate.  ECG: I personally reviewed the tracing from 11/06/2017 which showed sinus rhythm.  Recent Labwork: 07/22/2017: ALT 12; AST 12; BUN 15; Creat 0.79; Hemoglobin 13.1; Platelets 267; Potassium 4.1; Sodium 143 10/29/2017: TSH 2.07     Component Value Date/Time   CHOL 128 07/22/2017 0748   CHOL 155 01/08/2016 0809   TRIG 133 07/22/2017 0748   HDL 52 07/22/2017 0748   HDL 53 01/08/2016 0809   CHOLHDL 2.5 07/22/2017 0748   VLDL 22 01/20/2017 0724   LDLCALC 55 07/22/2017 0748    Other Studies Reviewed Today:  Carlton Adam Myoview 12/15/2017:  The study is normal. No myocardial ischemia or scar.  Nuclear stress EF: 62%.  This is a low risk study.  Assessment and Plan:  1.  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  She reports no progressive angina symptoms on medical therapy and had a low risk Myoview earlier this year.  Continue with present treatment and observation.  2.  Mixed hyperlipidemia, she continues on Zocor with follow-up per Dr. Wolfgang Phoenix.  Her last LDL was 55.  3.  Essential hypertension, blood pressure well controlled today.  4.  Left upper lobe pulmonary nodules, followed in the Pulmonary division.  Asymptomatic.  Current medicines were reviewed with the patient today.   Disposition: Follow-up in 6 months.  Signed, Satira Sark, MD, [facility] 05/11/2018 9:06 AM    [facility] [place], [place], [place] [address] Phone: [phone]; Fax: [phone]

## 2018-05-11 ENCOUNTER — Ambulatory Visit: Payer: Medicare HMO | Admitting: Cardiology

## 2018-05-11 ENCOUNTER — Encounter: Payer: Self-pay | Admitting: Cardiology

## 2018-05-11 VITALS — BP 118/70 | HR 59 | Ht 61.0 in | Wt 160.0 lb

## 2018-05-11 DIAGNOSIS — R911 Solitary pulmonary nodule: Secondary | ICD-10-CM | POA: Diagnosis not present

## 2018-05-11 DIAGNOSIS — I25119 Atherosclerotic heart disease of native coronary artery with unspecified angina pectoris: Secondary | ICD-10-CM

## 2018-05-11 DIAGNOSIS — I10 Essential (primary) hypertension: Secondary | ICD-10-CM

## 2018-05-11 DIAGNOSIS — E782 Mixed hyperlipidemia: Secondary | ICD-10-CM | POA: Diagnosis not present

## 2018-05-11 NOTE — Patient Instructions (Signed)

## 2018-05-26 ENCOUNTER — Encounter (HOSPITAL_COMMUNITY)
Admission: RE | Disposition: A | Discharge status: Home / Self Care | Payer: Self-pay | Source: Ambulatory Visit | Attending: Internal Medicine

## 2018-05-26 ENCOUNTER — Ambulatory Visit (HOSPITAL_COMMUNITY)
Admission: RE | Admit: 2018-05-26 | Discharge: 2018-05-26 | Disposition: A | Discharge status: Home / Self Care | Payer: Medicare HMO | Source: Ambulatory Visit | Attending: Internal Medicine | Admitting: Internal Medicine

## 2018-05-26 ENCOUNTER — Other Ambulatory Visit: Payer: Self-pay

## 2018-05-26 ENCOUNTER — Encounter (HOSPITAL_COMMUNITY): Payer: Self-pay

## 2018-05-26 DIAGNOSIS — Z802 Family history of malignant neoplasm of other respiratory and intrathoracic organs: Secondary | ICD-10-CM | POA: Diagnosis not present

## 2018-05-26 DIAGNOSIS — Z90710 Acquired absence of both cervix and uterus: Secondary | ICD-10-CM | POA: Diagnosis not present

## 2018-05-26 DIAGNOSIS — I2510 Atherosclerotic heart disease of native coronary artery without angina pectoris: Secondary | ICD-10-CM | POA: Diagnosis not present

## 2018-05-26 DIAGNOSIS — Z87891 Personal history of nicotine dependence: Secondary | ICD-10-CM | POA: Diagnosis not present

## 2018-05-26 DIAGNOSIS — I10 Essential (primary) hypertension: Secondary | ICD-10-CM | POA: Insufficient documentation

## 2018-05-26 DIAGNOSIS — Z7982 Long term (current) use of aspirin: Secondary | ICD-10-CM | POA: Insufficient documentation

## 2018-05-26 DIAGNOSIS — Z7951 Long term (current) use of inhaled steroids: Secondary | ICD-10-CM | POA: Insufficient documentation

## 2018-05-26 DIAGNOSIS — K6289 Other specified diseases of anus and rectum: Secondary | ICD-10-CM

## 2018-05-26 DIAGNOSIS — E039 Hypothyroidism, unspecified: Secondary | ICD-10-CM | POA: Insufficient documentation

## 2018-05-26 DIAGNOSIS — E782 Mixed hyperlipidemia: Secondary | ICD-10-CM | POA: Diagnosis not present

## 2018-05-26 DIAGNOSIS — I252 Old myocardial infarction: Secondary | ICD-10-CM | POA: Insufficient documentation

## 2018-05-26 DIAGNOSIS — Z86010 Personal history of colonic polyps: Secondary | ICD-10-CM | POA: Insufficient documentation

## 2018-05-26 DIAGNOSIS — Z1211 Encounter for screening for malignant neoplasm of colon: Secondary | ICD-10-CM | POA: Insufficient documentation

## 2018-05-26 DIAGNOSIS — D120 Benign neoplasm of cecum: Secondary | ICD-10-CM | POA: Insufficient documentation

## 2018-05-26 DIAGNOSIS — Z79899 Other long term (current) drug therapy: Secondary | ICD-10-CM | POA: Diagnosis not present

## 2018-05-26 DIAGNOSIS — Z09 Encounter for follow-up examination after completed treatment for conditions other than malignant neoplasm: Secondary | ICD-10-CM | POA: Diagnosis not present

## 2018-05-26 DIAGNOSIS — K644 Residual hemorrhoidal skin tags: Secondary | ICD-10-CM | POA: Diagnosis not present

## 2018-05-26 DIAGNOSIS — D125 Benign neoplasm of sigmoid colon: Secondary | ICD-10-CM | POA: Diagnosis not present

## 2018-05-26 HISTORY — PX: POLYPECTOMY: SHX5525

## 2018-05-26 HISTORY — PX: COLONOSCOPY: SHX5424

## 2018-05-26 SURGERY — COLONOSCOPY
Anesthesia: Moderate Sedation

## 2018-05-26 MED ORDER — MEPERIDINE HCL 50 MG/ML IJ SOLN
INTRAMUSCULAR | Status: DC | PRN
  Administered 2018-05-26 (×2): 25 mg via INTRAVENOUS

## 2018-05-26 MED ORDER — STERILE WATER FOR IRRIGATION IR SOLN
Status: DC | PRN
  Administered 2018-05-26: 11:00:00

## 2018-05-26 MED ORDER — MIDAZOLAM HCL 5 MG/5ML IJ SOLN
INTRAMUSCULAR | Status: DC | PRN
  Administered 2018-05-26 (×2): 2 mg via INTRAVENOUS
  Administered 2018-05-26: 1 mg via INTRAVENOUS

## 2018-05-26 MED ORDER — SODIUM CHLORIDE 0.9 % IV SOLN
INTRAVENOUS | Status: DC
  Administered 2018-05-26: 10:00:00 via INTRAVENOUS

## 2018-05-26 MED ORDER — MEPERIDINE HCL 50 MG/ML IJ SOLN
INTRAMUSCULAR | Status: AC
  Filled 2018-05-26: qty 1

## 2018-05-26 MED ORDER — MIDAZOLAM HCL 5 MG/5ML IJ SOLN
INTRAMUSCULAR | Status: AC
  Filled 2018-05-26: qty 10

## 2018-05-26 NOTE — Op Note (Signed)
[facility] Patient Name: [name] Procedure Date: 05/26/2018 10:24 AM MRN: [id number] Date of Birth: 05-03-1961 Attending MD: Hildred Laser , MD CSN: [id number] Age: 57 Admit Type: Outpatient Procedure:                Colonoscopy Indications:              High risk colon cancer surveillance: Personal                            history of colonic polyps Providers:                Hildred Laser, MD, Otis Peak B. Sharon Seller, RN, Randa Spike, Technician Referring MD:             Elayne Snare. Wolfgang Phoenix, MD Medicines:                Meperidine 50 mg IV, Midazolam 5 mg IV Complications:            No immediate complications. Estimated Blood Loss:     Estimated blood loss was minimal. Procedure:                Pre-Anesthesia Assessment:                           - Prior to the procedure, a History and Physical                            was performed, and patient medications and                            allergies were reviewed. The patient's tolerance of                            previous anesthesia was also reviewed. The risks                            and benefits of the procedure and the sedation                            options and risks were discussed with the patient.                            All questions were answered, and informed consent                            was obtained. Prior Anticoagulants: The patient                            last took aspirin 3 days prior to the procedure.                            ASA Grade Assessment: III - A patient with severe  systemic disease. After reviewing the risks and                            benefits, the patient was deemed in satisfactory                            condition to undergo the procedure.                           After obtaining informed consent, the colonoscope                            was passed under direct vision. Throughout the   procedure, the patient's blood pressure, pulse, and                            oxygen saturations were monitored continuously. The                            [id number]-[id number] ([id number]) was introduced through the                            anus and advanced to the the cecum, identified by                            appendiceal orifice and ileocecal valve. The                            colonoscopy was performed without difficulty. The                            patient tolerated the procedure well. The quality                            of the bowel preparation was adequate to identify                            polyps. The ileocecal valve, appendiceal orifice,                            and rectum were photographed. Scope In: 23:55:73 AM Scope Out: 11:04:51 AM Scope Withdrawal Time: 0 hours 12 minutes 47 seconds  Total Procedure Duration: 0 hours 18 minutes 33 seconds  Findings:      The perianal and digital rectal examinations were normal.      Two sessile polyps were found in the cecum. The polyps were small in       size. These were biopsied with a cold forceps for histology. The       pathology specimen was placed into Bottle Number 1.      A small polyp was found in the sigmoid colon. The polyp was sessile.       Biopsies were taken with a cold forceps for histology. The pathology       specimen was placed into Bottle Number 1.      External hemorrhoids were found during retroflexion. The  hemorrhoids       were small.      Anal papilla(e) were hypertrophied. Impression:               - Two small polyps in the cecum. Biopsied.                           - One small polyp in the sigmoid colon. Biopsied.                           - External hemorrhoids.                           - Anal papilla(e) were hypertrophied. Moderate Sedation:      Moderate (conscious) sedation was administered by the endoscopy nurse       and supervised by the endoscopist. The following parameters were        monitored: oxygen saturation, heart rate, blood pressure, CO2       capnography and response to care. Total physician intraservice time was       25 minutes. Recommendation:           - Patient has a contact number available for                            emergencies. The signs and symptoms of potential                            delayed complications were discussed with the                            patient. Return to normal activities tomorrow.                            Written discharge instructions were provided to the                            patient.                           - Resume previous diet today.                           - Continue present medications.                           - No aspirin, ibuprofen, naproxen, or other                            non-steroidal anti-inflammatory drugs for 1 day.                           - Await pathology results.                           - Repeat colonoscopy is recommended. The  colonoscopy date will be determined after pathology                            results from today's exam become available for                            review. Procedure Code(s):        --- Professional ---                           [phone], Colonoscopy, flexible; with biopsy, single                            or multiple                           G0500, Moderate sedation services provided by the                            same physician or other qualified health care                            professional performing a gastrointestinal                            endoscopic service that sedation supports,                            requiring the presence of an independent trained                            observer to assist in the monitoring of the                            patient's level of consciousness and physiological                            status; initial 15 minutes of intra-service time;                             patient age 46 years or older (additional time may                            be reported with [phone], as appropriate)                           [phone], Moderate sedation services provided by the                            same physician or other qualified health care                            professional performing the diagnostic or  therapeutic service that the sedation supports,                            requiring the presence of an independent trained                            observer to assist in the monitoring of the                            patient's level of consciousness and physiological                            status; each additional 15 minutes intraservice                            time (List separately in addition to code for                            primary service) Diagnosis Code(s):        --- Professional ---                           Z86.010, Personal history of colonic polyps                           D12.0, Benign neoplasm of cecum                           D12.5, Benign neoplasm of sigmoid colon                           K62.89, Other specified diseases of anus and rectum                           K64.4, Residual hemorrhoidal skin tags CPT copyright 2017 [employer] Association. All rights reserved. The codes documented in this report are preliminary and upon coder review may  be revised to meet current compliance requirements. Hildred Laser, MD Hildred Laser, MD 05/26/2018 11:13:01 AM This report has been signed electronically. Number of Addenda: 0

## 2018-05-26 NOTE — Discharge Instructions (Signed)
No aspirin or NSAIDs for 24 hours. Resume usual diet. No driving for 24 hours. Physician will call with biopsy results.   Colonoscopy, Adult, Care After This sheet gives you information about how to care for yourself after your procedure. Your health care provider may also give you more specific instructions. If you have problems or questions, contact your health care provider. What can I expect after the procedure? After the procedure, it is common to have:  A small amount of blood in your stool for 24 hours after the procedure.  Some gas.  Mild abdominal cramping or bloating.  Follow these instructions at home: General instructions   For the first 24 hours after the procedure: ? Do not drive or use machinery. ? Do not sign important documents. ? Do not drink alcohol. ? Do your regular daily activities at a slower pace than normal. ? Eat soft, easy-to-digest foods. ? Rest often.  Take over-the-counter or prescription medicines only as told by your health care provider.  It is up to you to get the results of your procedure. Ask your health care provider, or the department performing the procedure, when your results will be ready. Relieving cramping and bloating  Try walking around when you have cramps or feel bloated.  Apply heat to your abdomen as told by your health care provider. Use a heat source that your health care provider recommends, such as a moist heat pack or a heating pad. ? Place a towel between your skin and the heat source. ? Leave the heat on for 20-30 minutes. ? Remove the heat if your skin turns bright red. This is especially important if you are unable to feel pain, heat, or cold. You may have a greater risk of getting burned. Eating and drinking  Drink enough fluid to keep your urine clear or pale yellow.  Resume your normal diet as instructed by your health care provider. Avoid heavy or fried foods that are hard to digest.  Avoid drinking alcohol  for as long as instructed by your health care provider. Contact a health care provider if:  You have blood in your stool 2-3 days after the procedure. Get help right away if:  You have more than a small spotting of blood in your stool.  You pass large blood clots in your stool.  Your abdomen is swollen.  You have nausea or vomiting.  You have a fever.  You have increasing abdominal pain that is not relieved with medicine. This information is not intended to replace advice given to you by your health care provider. Make sure you discuss any questions you have with your health care provider. Document Released: 04/22/2004 Document Revised: 06/02/2016 Document Reviewed: 11/20/2015 Elsevier Interactive Patient Education  2018 [employer].  Hemorrhoids Hemorrhoids are swollen veins in and around the rectum or anus. There are two types of hemorrhoids:  Internal hemorrhoids. These occur in the veins that are just inside the rectum. They may poke through to the outside and become irritated and painful.  External hemorrhoids. These occur in the veins that are outside of the anus and can be felt as a painful swelling or hard lump near the anus.  Most hemorrhoids do not cause serious problems, and they can be managed with home treatments such as diet and lifestyle changes. If home treatments do not help your symptoms, procedures can be done to shrink or remove the hemorrhoids. What are the causes? This condition is caused by increased pressure in the anal  area. This pressure may result from various things, including:  Constipation.  Straining to have a bowel movement.  Diarrhea.  Pregnancy.  Obesity.  Sitting for long periods of time.  Heavy lifting or other activity that causes you to strain.  Anal sex.  What are the signs or symptoms? Symptoms of this condition include:  Pain.  Anal itching or irritation.  Rectal bleeding.  Leakage of stool (feces).  Anal  swelling.  One or more lumps around the anus.  How is this diagnosed? This condition can often be diagnosed through a visual exam. Other exams or tests may also be done, such as:  Examination of the rectal area with a gloved hand (digital rectal exam).  Examination of the anal canal using a small tube (anoscope).  A blood test, if you have lost a significant amount of blood.  A test to look inside the colon (sigmoidoscopy or colonoscopy).  How is this treated? This condition can usually be treated at home. However, various procedures may be done if dietary changes, lifestyle changes, and other home treatments do not help your symptoms. These procedures can help make the hemorrhoids smaller or remove them completely. Some of these procedures involve surgery, and others do not. Common procedures include:  Rubber band ligation. Rubber bands are placed at the base of the hemorrhoids to cut off the blood supply to them.  Sclerotherapy. Medicine is injected into the hemorrhoids to shrink them.  Infrared coagulation. A type of light energy is used to get rid of the hemorrhoids.  Hemorrhoidectomy surgery. The hemorrhoids are surgically removed, and the veins that supply them are tied off.  Stapled hemorrhoidopexy surgery. A circular stapling device is used to remove the hemorrhoids and use staples to cut off the blood supply to them.  Follow these instructions at home: Eating and drinking  Eat foods that have a lot of fiber in them, such as whole grains, beans, nuts, fruits, and vegetables. Ask your health care provider about taking products that have added fiber (fiber supplements).  Drink enough fluid to keep your urine clear or pale yellow. Managing pain and swelling  Take warm sitz baths for 20 minutes, 3-4 times a day to ease pain and discomfort.  If directed, apply ice to the affected area. Using ice packs between sitz baths may be helpful. ? Put ice in a plastic bag. ? Place a  towel between your skin and the bag. ? Leave the ice on for 20 minutes, 2-3 times a day. General instructions  Take over-the-counter and prescription medicines only as told by your health care provider.  Use medicated creams or suppositories as told.  Exercise regularly.  Go to the bathroom when you have the urge to have a bowel movement. Do not wait.  Avoid straining to have bowel movements.  Keep the anal area dry and clean. Use wet toilet paper or moist towelettes after a bowel movement.  Do not sit on the toilet for long periods of time. This increases blood pooling and pain. Contact a health care provider if:  You have increasing pain and swelling that are not controlled by treatment or medicine.  You have uncontrolled bleeding.  You have difficulty having a bowel movement, or you are unable to have a bowel movement.  You have pain or inflammation outside the area of the hemorrhoids. This information is not intended to replace advice given to you by your health care provider. Make sure you discuss any questions you have with your  health care provider. Document Released: 09/05/2000 Document Revised: 02/06/2016 Document Reviewed: 05/23/2015 Elsevier Interactive Patient Education  2018 [employer].  Colon Polyps Polyps are tissue growths inside the body. Polyps can grow in many places, including the large intestine (colon). A polyp may be a round bump or a mushroom-shaped growth. You could have one polyp or several. Most colon polyps are noncancerous (benign). However, some colon polyps can become cancerous over time. What are the causes? The exact cause of colon polyps is not known. What increases the risk? This condition is more likely to develop in people who:  Have a family history of colon cancer or colon polyps.  Are older than 63 or older than 45 if they are African American.  Have inflammatory bowel disease, such as ulcerative colitis or Crohn disease.  Are  overweight.  Smoke cigarettes.  Do not get enough exercise.  Drink too much alcohol.  Eat a diet that is: ? High in fat and red meat. ? Low in fiber.  Had childhood cancer that was treated with abdominal radiation.  What are the signs or symptoms? Most polyps do not cause symptoms. If you have symptoms, they may include:  Blood coming from your rectum when having a bowel movement.  Blood in your stool.The stool may look dark red or black.  A change in bowel habits, such as constipation or diarrhea.  How is this diagnosed? This condition is diagnosed with a colonoscopy. This is a procedure that uses a lighted, flexible scope to look at the inside of your colon. How is this treated? Treatment for this condition involves removing any polyps that are found. Those polyps will then be tested for cancer. If cancer is found, your health care provider will talk to you about options for colon cancer treatment. Follow these instructions at home: Diet  Eat plenty of fiber, such as fruits, vegetables, and whole grains.  Eat foods that are high in calcium and vitamin D, such as milk, cheese, yogurt, eggs, liver, fish, and broccoli.  Limit foods high in fat, red meats, and processed meats, such as hot dogs, sausage, bacon, and lunch meats.  Maintain a healthy weight, or lose weight if recommended by your health care provider. General instructions  Do not smoke cigarettes.  Do not drink alcohol excessively.  Keep all follow-up visits as told by your health care provider. This is important. This includes keeping regularly scheduled colonoscopies. Talk to your health care provider about when you need a colonoscopy.  Exercise every day or as told by your health care provider. Contact a health care provider if:  You have new or worsening bleeding during a bowel movement.  You have new or increased blood in your stool.  You have a change in bowel habits.  You unexpectedly lose  weight. This information is not intended to replace advice given to you by your health care provider. Make sure you discuss any questions you have with your health care provider. Document Released: 06/04/2004 Document Revised: 02/14/2016 Document Reviewed: 07/30/2015 Elsevier Interactive Patient Education  [employer].

## 2018-05-26 NOTE — H&P (Signed)
[name] is an 57 y.o. female.   Chief Complaint: Patient is here for colonoscopy. HPI: Patient is 57 year old Caucasian female with history of colonic adenomas and is here for surveillance colonoscopy.  She denies abdominal pain change in bowel habits or rectal bleeding.  Last exam was 7 years ago with removal of 2 small adenomas by Dr. Britta Mccreedy of [facility], [facility]. Family history is negative for CRC.  Past Medical History:  Diagnosis Date  . Coronary atherosclerosis of native coronary artery    DES RCA 5/09, nonobstructive left system  . Essential hypertension, benign   . Hypothyroidism   . Mixed hyperlipidemia   . NSTEMI (non-ST elevated myocardial infarction) ([place])    2009    Past Surgical History:  Procedure Laterality Date  . BREAST LUMPECTOMY    . TOTAL ABDOMINAL HYSTERECTOMY      Family History  Problem Relation Age of Onset  . Pneumonia Brother        age 62  . Cancer Brother        larynx - age 59  . Pneumonia Brother        declining health - age 25   Social History:  reports that she quit smoking about 12 years ago. Her smoking use included cigarettes. She started smoking about 45 years ago. She has a 66.00 pack-year smoking history. She has never used smokeless tobacco. She reports that she does not drink alcohol or use drugs.  Allergies: No Known Allergies  Medications Prior to Admission  Medication Sig Dispense Refill  . albuterol (PROVENTIL) (2.5 MG/3ML) 0.083% nebulizer solution Take 3 mLs (2.5 mg total) by nebulization every 4 (four) hours as needed for wheezing. 75 mL 12  . albuterol (VENTOLIN HFA) 108 (90 Base) MCG/ACT inhaler INHALE TWO PUFFS BY MOUTH EVERY 6 HOURS AS NEEDED FOR WHEEZING OR SHORTNESS OF BREATH (Patient taking differently: Inhale 2 puffs into the lungs every 6 (six) hours as needed for wheezing or shortness of breath. ) 18 each 5  . aspirin 81 MG tablet Take 81 mg by mouth at bedtime.     . Cholecalciferol (VITAMIN D3) 400  units CAPS Take 400 Units by mouth daily.    . fluticasone (FLOVENT HFA) 220 MCG/ACT inhaler Inhale 1 puff into the lungs 2 (two) times daily.     Marland Kitchen levothyroxine (SYNTHROID, LEVOTHROID) 125 MCG tablet TAKE 1/2 TABLET BY MOUTH EVERY MONDAY AND 1 TABLET ONCE DAILY ON ALL OTHER DAYS (Patient taking differently: Take 62.5-125 mcg by mouth See admin instructions. Take 62.5 mcg by mouth daily on Monday. Take 125 mcg by mouth daily on all other days.) 90 tablet 1  . metoprolol tartrate (LOPRESSOR) 25 MG tablet Take 0.5 tablets (12.5 mg total) by mouth 2 (two) times daily. 90 tablet 1  . simvastatin (ZOCOR) 40 MG tablet TAKE 1 TABLET BY MOUTH EVERYDAY AT BEDTIME (Patient taking differently: Take 40 mg by mouth at bedtime. ) 90 tablet 1  . umeclidinium bromide (INCRUSE ELLIPTA) 62.5 MCG/INH AEPB Inhale 1 puff into the lungs daily. 30 each 5  . nitroGLYCERIN (NITROSTAT) 0.4 MG SL tablet Place 1 tablet (0.4 mg total) under the tongue every 5 (five) minutes x 3 doses as needed. (Patient taking differently: Place 0.4 mg under the tongue every 5 (five) minutes x 3 doses as needed for chest pain. ) 25 tablet 3    No results found for this or any previous visit (from the past 48 hour(s)). No results found.  ROS  Blood pressure 126/64, pulse 61, temperature 97.8 F (36.6 C), temperature source Oral, resp. rate 20, SpO2 96 %. Physical Exam  Constitutional: She appears well-developed and well-nourished.  HENT:  Mouth/Throat: Oropharynx is clear and moist.  Eyes: Conjunctivae are normal. No scleral icterus.  Neck: No thyromegaly present.  Cardiovascular: Normal rate, regular rhythm and normal heart sounds.  No murmur heard. Respiratory: Effort normal and breath sounds normal.  GI: Soft. She exhibits no distension and no mass. There is no tenderness.  Musculoskeletal: She exhibits no edema.  Lymphadenopathy:    She has no cervical adenopathy.  Neurological: She is alert.  Skin: Skin is warm and dry.      Assessment/Plan history of colonic adenomas. Surveillance colonoscopy.  Hildred Laser, MD 05/26/2018, 10:36 AM

## 2018-05-31 ENCOUNTER — Telehealth (INDEPENDENT_AMBULATORY_CARE_PROVIDER_SITE_OTHER): Payer: Self-pay | Admitting: Internal Medicine

## 2018-05-31 ENCOUNTER — Encounter (HOSPITAL_COMMUNITY): Payer: Self-pay | Admitting: Internal Medicine

## 2018-05-31 NOTE — Telephone Encounter (Signed)
Patient returned your call.

## 2018-06-01 NOTE — Telephone Encounter (Signed)
I had already called the patient the results yesterday morning

## 2018-06-10 ENCOUNTER — Encounter: Payer: Self-pay | Admitting: Family Medicine

## 2018-06-10 ENCOUNTER — Ambulatory Visit (INDEPENDENT_AMBULATORY_CARE_PROVIDER_SITE_OTHER): Payer: Medicare HMO | Admitting: Family Medicine

## 2018-06-10 VITALS — BP 128/78 | Temp 98.2°F | Ht 61.0 in | Wt 162.1 lb

## 2018-06-10 DIAGNOSIS — T148XXA Other injury of unspecified body region, initial encounter: Secondary | ICD-10-CM | POA: Diagnosis not present

## 2018-06-10 DIAGNOSIS — L03116 Cellulitis of left lower limb: Secondary | ICD-10-CM

## 2018-06-10 DIAGNOSIS — Z23 Encounter for immunization: Secondary | ICD-10-CM

## 2018-06-10 DIAGNOSIS — M79672 Pain in left foot: Secondary | ICD-10-CM | POA: Diagnosis not present

## 2018-06-10 MED ORDER — CEPHALEXIN 500 MG PO CAPS: 500.0000 mg | ORAL_CAPSULE | Freq: Four times a day (QID) | ORAL | 0 refills | Status: AC

## 2018-06-10 NOTE — Progress Notes (Signed)
   Subjective:    Patient ID: [name], female    DOB: 1961-02-10, 57 y.o.   MRN: [id number]  HPI Patient is here today due to her left t foot swelling.She states dropped a piece of plywood last week on her right ankle and now it has started swelling.It is not painful. Intermittent foot swelling of the left foot some tenderness around the spot on the lower leg where she had a scab from a abrasion she denies fever chills sweats denies any other particular troubles.  Energy level overall good denies high fever chills Review of Systems  Constitutional: Negative for activity change, appetite change and fatigue.  HENT: Negative for congestion and rhinorrhea.   Respiratory: Negative for cough and shortness of breath.   Cardiovascular: Negative for chest pain and leg swelling.  Gastrointestinal: Negative for abdominal pain and diarrhea.  Endocrine: Negative for polydipsia and polyphagia.  Skin: Negative for color change.  Neurological: Negative for dizziness and weakness.  Psychiatric/Behavioral: Negative for behavioral problems and confusion.       Objective:   Physical Exam  Constitutional: She appears well-nourished. No distress.  HENT:  Head: Normocephalic.  Cardiovascular: Normal rate, regular rhythm and normal heart sounds.  No murmur heard. Pulmonary/Chest: Effort normal and breath sounds normal.  Musculoskeletal: She exhibits no edema.  Lymphadenopathy:    She has no cervical adenopathy.  Neurological: She is alert.  Psychiatric: Her behavior is normal.  Vitals reviewed.   She has a little bit of localized cellulitis.  She also has a little bit of swelling around the bottom part of her foot      Assessment & Plan:  Early cellulitis Tetanus shot Antibiotic Follow-up if progressive troubles  COPD patient states she tried Trelogy that her friend had and she stated helped she is interested in potentially getting this prescribed to her she will check her insurance  company to see if it is covered if so she will let us know  She will follow-up in October

## 2018-06-14 DIAGNOSIS — I10 Essential (primary) hypertension: Secondary | ICD-10-CM | POA: Diagnosis not present

## 2018-06-14 DIAGNOSIS — H524 Presbyopia: Secondary | ICD-10-CM | POA: Diagnosis not present

## 2018-06-16 ENCOUNTER — Other Ambulatory Visit: Payer: Self-pay | Admitting: Family Medicine

## 2018-06-16 ENCOUNTER — Telehealth: Payer: Self-pay | Admitting: Family Medicine

## 2018-06-16 MED ORDER — FLUTICASONE-UMECLIDIN-VILANT 100-62.5-25 MCG/INH IN AEPB: 1.0000 | INHALATION_SPRAY | Freq: Every day | RESPIRATORY_TRACT | 12 refills | Status: DC

## 2018-06-16 NOTE — Telephone Encounter (Signed)
Pt contacted and medication sent in. Pt informed to discontinue flovent and incruse. Pt verbalized understanding.

## 2018-06-16 NOTE — Telephone Encounter (Signed)
Discontinue Flovent discontinue Incruse May start Trelegy 1 inhalation daily Rinse mouth after use Prescribe one device with 12 refills Notify us if any problems Keep all regular follow-ups

## 2018-06-16 NOTE — Telephone Encounter (Signed)
Pt states she checked with her insurance & the will cover the Trelogy  Dr. Nicki Reaper asked pt to check on this & let us know so it oculd be ordered for her  Please advise & call pt    [facility]

## 2018-06-18 ENCOUNTER — Telehealth: Payer: Self-pay | Admitting: Family Medicine

## 2018-06-18 MED ORDER — FLUTICASONE-UMECLIDIN-VILANT 100-62.5-25 MCG/INH IN AEPB: 1.0000 | INHALATION_SPRAY | Freq: Every day | RESPIRATORY_TRACT | 12 refills | Status: DC

## 2018-06-18 NOTE — Telephone Encounter (Signed)
Patient calling back because [facility] said they did not get the Trelegy rx that was sent the other day and she would like it resent.

## 2018-06-18 NOTE — Telephone Encounter (Signed)
She is aware 

## 2018-06-18 NOTE — Telephone Encounter (Signed)
It was sent in,but sent in again. I called and left a message asked that the pt r/c to let us know she received the message that it was resent.

## 2018-06-21 ENCOUNTER — Emergency Department (HOSPITAL_COMMUNITY): Payer: Medicare HMO

## 2018-06-21 ENCOUNTER — Telehealth: Payer: Self-pay | Admitting: *Deleted

## 2018-06-21 ENCOUNTER — Emergency Department (HOSPITAL_COMMUNITY)
Admission: EM | Admit: 2018-06-21 | Discharge: 2018-06-21 | Disposition: A | Discharge status: Home / Self Care | Payer: Medicare HMO | Attending: Emergency Medicine | Admitting: Emergency Medicine

## 2018-06-21 ENCOUNTER — Other Ambulatory Visit: Payer: Self-pay

## 2018-06-21 ENCOUNTER — Encounter (HOSPITAL_COMMUNITY): Payer: Self-pay | Admitting: Emergency Medicine

## 2018-06-21 DIAGNOSIS — I252 Old myocardial infarction: Secondary | ICD-10-CM | POA: Insufficient documentation

## 2018-06-21 DIAGNOSIS — M7989 Other specified soft tissue disorders: Secondary | ICD-10-CM | POA: Diagnosis not present

## 2018-06-21 DIAGNOSIS — Z79899 Other long term (current) drug therapy: Secondary | ICD-10-CM | POA: Diagnosis not present

## 2018-06-21 DIAGNOSIS — J449 Chronic obstructive pulmonary disease, unspecified: Secondary | ICD-10-CM | POA: Diagnosis not present

## 2018-06-21 DIAGNOSIS — Z85828 Personal history of other malignant neoplasm of skin: Secondary | ICD-10-CM | POA: Diagnosis not present

## 2018-06-21 DIAGNOSIS — Z87891 Personal history of nicotine dependence: Secondary | ICD-10-CM | POA: Diagnosis not present

## 2018-06-21 DIAGNOSIS — R200 Anesthesia of skin: Secondary | ICD-10-CM | POA: Insufficient documentation

## 2018-06-21 DIAGNOSIS — M79602 Pain in left arm: Secondary | ICD-10-CM | POA: Insufficient documentation

## 2018-06-21 DIAGNOSIS — R202 Paresthesia of skin: Secondary | ICD-10-CM | POA: Diagnosis not present

## 2018-06-21 DIAGNOSIS — M79622 Pain in left upper arm: Secondary | ICD-10-CM | POA: Diagnosis not present

## 2018-06-21 DIAGNOSIS — E039 Hypothyroidism, unspecified: Secondary | ICD-10-CM | POA: Diagnosis not present

## 2018-06-21 DIAGNOSIS — Z7982 Long term (current) use of aspirin: Secondary | ICD-10-CM | POA: Diagnosis not present

## 2018-06-21 DIAGNOSIS — I2510 Atherosclerotic heart disease of native coronary artery without angina pectoris: Secondary | ICD-10-CM | POA: Insufficient documentation

## 2018-06-21 DIAGNOSIS — M5412 Radiculopathy, cervical region: Secondary | ICD-10-CM | POA: Diagnosis not present

## 2018-06-21 DIAGNOSIS — R531 Weakness: Secondary | ICD-10-CM | POA: Diagnosis not present

## 2018-06-21 LAB — CBC WITH DIFFERENTIAL/PLATELET
Basophils Absolute: 0 10*3/uL (ref 0.0–0.1)
Basophils Relative: 1 %
EOS ABS: 0.1 10*3/uL (ref 0.0–0.7)
EOS PCT: 2 %
HEMATOCRIT: 42.6 % (ref 36.0–46.0)
HEMOGLOBIN: 13.8 g/dL (ref 12.0–15.0)
LYMPHS ABS: 2.2 10*3/uL (ref 0.7–4.0)
LYMPHS PCT: 37 %
MCH: 30.8 pg (ref 26.0–34.0)
MCHC: 32.4 g/dL (ref 30.0–36.0)
MCV: 95.1 fL (ref 78.0–100.0)
MONO ABS: 0.8 10*3/uL (ref 0.1–1.0)
Monocytes Relative: 13 %
Neutro Abs: 2.8 10*3/uL (ref 1.7–7.7)
Neutrophils Relative %: 47 %
Platelets: 215 10*3/uL (ref 150–400)
RBC: 4.48 MIL/uL (ref 3.87–5.11)
RDW: 13.6 % (ref 11.5–15.5)
WBC: 5.9 10*3/uL (ref 4.0–10.5)

## 2018-06-21 LAB — BASIC METABOLIC PANEL
ANION GAP: 8 (ref 5–15)
BUN: 14 mg/dL (ref 6–20)
CHLORIDE: 106 mmol/L (ref 98–111)
CO2: 26 mmol/L (ref 22–32)
Calcium: 8.9 mg/dL (ref 8.9–10.3)
Creatinine, Ser: 0.72 mg/dL (ref 0.44–1.00)
GFR calc Af Amer: >60 mL/min (ref >60)
GFR calc non Af Amer: >60 mL/min (ref >60)
GLUCOSE: 91 mg/dL (ref 70–99)
POTASSIUM: 3.9 mmol/L (ref 3.5–5.1)
Sodium: 140 mmol/L (ref 135–145)

## 2018-06-21 LAB — TROPONIN I

## 2018-06-21 LAB — D-DIMER, QUANTITATIVE: D-Dimer, Quant: 0.69 ug/mL-FEU — ABNORMAL HIGH (ref 0.00–0.50)

## 2018-06-21 LAB — CBG MONITORING, ED: Glucose-Capillary: 97 mg/dL (ref 70–99)

## 2018-06-21 MED ORDER — HYDROCODONE-ACETAMINOPHEN 5-325 MG PO TABS: 1.0000 | ORAL_TABLET | ORAL | 0 refills | Status: DC | PRN

## 2018-06-21 MED ORDER — DEXAMETHASONE 4 MG PO TABS: 4.0000 mg | ORAL_TABLET | Freq: Two times a day (BID) | ORAL | 0 refills | Status: DC

## 2018-06-21 NOTE — ED Provider Notes (Signed)
CONTINUING CARE FROM J. IDOL, PA-C  57 year old female who presented to the emergency department with a sensation of numbness in the left upper extremity.  Patient has multiple medical issues including coronary atherosclerosis, hyperlipidemia, and previous NSTEMI.  Patient now having numbness in the left arm.  The work-up so far has been negative for cardiac involvement.  Patient has slightly elevated d-dimer, and Doppler ultrasound is pending.  Doppler ultrasound shows no evidence of DVT within the left upper extremity.  Examination shows the is good range of motion of the left shoulder with some crepitus present.  There is no swelling of the bicep tricep area.  There is no effusion of the elbow.  There is no pain to the elbow.  There is no swelling of the forearm area.  No temperature changes of the left upper extremity.  The radial pulses 2+.  Capillary refill is less than 2 seconds.  Patient will continue her gabapentin.  Patient is asked to follow up with Dr. Wolfgang Phoenix in the office.  Patient acknowledges understanding of these instructions and is in agreement.   Lily Kocher, PA-C 06/22/18 [id number]    Davonna Belling, MD 06/22/18 2358

## 2018-06-21 NOTE — ED Triage Notes (Signed)
Pt reports numbness to left arm for over one week. States numbness worse with repetitive movement. Denies other symptoms.

## 2018-06-21 NOTE — Discharge Instructions (Addendum)
Your test are negative for cardiac event.  There is no evidence of cerebrovascular accident/stroke.  The ultrasound is negative for a blood clot/deep vein thrombus.  Your examination favors cervical radiculopathy.  Please use Decadron 2 times daily with food.  Please use Tylenol extra strength for mild pain.  Please use Norco for more severe pain. Please see Dr Aline Brochure for evaluation concerning this problem.

## 2018-06-21 NOTE — Telephone Encounter (Signed)
I agree with the assessment. 

## 2018-06-21 NOTE — ED Notes (Signed)
Patient transported to CT 

## 2018-06-21 NOTE — ED Provider Notes (Signed)
[facility] EMERGENCY DEPARTMENT Provider Note   CSN: [id number] Arrival date & time: 06/21/18  1409     History   Chief Complaint Chief Complaint  Patient presents with  . Numbness    HPI [name] is a 57 y.o. female with a history of CAD with a non-STEMI and stent placement in 2009, hypertension, hypothyroidism and hyperlipidemia presenting with a one-week history of left upper extremity numbness and weakness which she states has been fairly persistent but waxes and wanes in intensity.  She simply woke up with the symptoms about a week ago, denies any injuries, but states she thought she just "slept wrong" but her symptoms have not resolved.  She denies headache, neck pain or history of cervical neck injury, but she endorses a deep pressure sensation in her left paracervical area.  She has also noticed a more prominent vein in her left wrist and was concerned about possible blood clot in her arm causing the symptoms.  She contacted her PCP this morning who directed her here.  She has no swelling in the extremity and no past history of DVTs.  No TIA or CVA history.  She denies chest pain, palpitations, shortness of breath.  She has had no treatments prior to arrival.  Patient is right-handed.  She is a retired [employer] account executive.  The history is provided by the patient.    Past Medical History:  Diagnosis Date  . Coronary atherosclerosis of native coronary artery    DES RCA 5/09, nonobstructive left system  . Essential hypertension, benign   . Hypothyroidism   . Mixed hyperlipidemia   . NSTEMI (non-ST elevated myocardial infarction) [facility])    2009    Patient Active Problem List   Diagnosis Date Noted  . History of colonic polyps 03/12/2018  . Skin cancer 05/11/2017  . Hypothyroidism 08/23/2014  . COPD (chronic obstructive pulmonary disease) with chronic bronchitis ([place]) 08/23/2014  . Mixed hyperlipidemia 05/15/2009  . Essential hypertension, benign 05/15/2009  . CAD,  NATIVE VESSEL 05/15/2009    Past Surgical History:  Procedure Laterality Date  . BREAST LUMPECTOMY    . COLONOSCOPY N/A 05/26/2018   Procedure: COLONOSCOPY;  Surgeon: Rogene Houston, MD;  Location: [facility] ENDO SUITE;  Service: Endoscopy;  Laterality: N/A;  1030  . POLYPECTOMY  05/26/2018   Procedure: POLYPECTOMY;  Surgeon: Rogene Houston, MD;  Location: [facility] ENDO SUITE;  Service: Endoscopy;;  colon  . TOTAL ABDOMINAL HYSTERECTOMY       OB History   None      Home Medications    Prior to Admission medications   Medication Sig Start Date End Date Taking? Authorizing Provider  albuterol (PROVENTIL) (2.5 MG/3ML) 0.083% nebulizer solution Take 3 mLs (2.5 mg total) by nebulization every 4 (four) hours as needed for wheezing. 08/14/14  Yes Luking, Elayne Snare, MD  albuterol (VENTOLIN HFA) 108 (90 Base) MCG/ACT inhaler INHALE TWO PUFFS BY MOUTH EVERY 6 HOURS AS NEEDED FOR WHEEZING OR SHORTNESS OF BREATH Patient taking differently: Inhale 2 puffs into the lungs every 6 (six) hours as needed for wheezing or shortness of breath.  01/18/18  Yes Luking, Elayne Snare, MD  aspirin 81 MG EC tablet Take 81 mg by mouth at bedtime.    Yes [provider]  Cholecalciferol (VITAMIN D3) 400 units CAPS Take 400 Units by mouth at bedtime.    Yes [provider]  Fluticasone-Umeclidin-Vilant (TRELEGY ELLIPTA) 100-62.5-25 MCG/INH AEPB Inhale 1 Inhaler into the lungs daily. Rinse mouth after  use. 06/18/18  Yes Luking, Elayne Snare, MD  levothyroxine (SYNTHROID, LEVOTHROID) 125 MCG tablet TAKE 1/2 TABLET BY MOUTH EVERY MONDAY AND 1 TABLET ONCE DAILY ON ALL OTHER DAYS Patient taking differently: Take 62.5-125 mcg by mouth See admin instructions. Take 62.5 mcg by mouth daily on Monday. Take 125 mcg by mouth daily on all other days. 01/18/18  Yes Kathyrn Drown, MD  metoprolol tartrate (LOPRESSOR) 25 MG tablet Take 0.5 tablets (12.5 mg total) by mouth 2 (two) times daily. 01/18/18  Yes Kathyrn Drown, MD  NAPROXEN  SODIUM PO Take by mouth 2 (two) times daily as needed (pain).   Yes [provider]  simvastatin (ZOCOR) 40 MG tablet TAKE 1 TABLET BY MOUTH EVERYDAY AT BEDTIME Patient taking differently: Take 40 mg by mouth at bedtime.  01/11/18  Yes Mikey Kirschner, MD  nitroGLYCERIN (NITROSTAT) 0.4 MG SL tablet Place 1 tablet (0.4 mg total) under the tongue every 5 (five) minutes x 3 doses as needed. Patient taking differently: Place 0.4 mg under the tongue every 5 (five) minutes x 3 doses as needed for chest pain.  08/14/15   Satira Sark, MD    Family History Family History  Problem Relation Age of Onset  . Pneumonia Brother        age 29  . Cancer Brother        larynx - age 63  . Pneumonia Brother        declining health - age 39    Social History Social History   Tobacco Use  . Smoking status: Former Smoker    Packs/day: 2.00    Years: 33.00    Pack years: 66.00    Types: Cigarettes    Start date: 10/27/1972    Last attempt to quit: 2007    Years since quitting: 12.7  . Smokeless tobacco: Never Used  Substance Use Topics  . Alcohol use: No    Alcohol/week: 0.0 standard drinks  . Drug use: No     Allergies   Patient has no known allergies.   Review of Systems Review of Systems  Constitutional: Negative for fever.  HENT: Negative for congestion and sore throat.   Eyes: Negative.   Respiratory: Negative for chest tightness and shortness of breath.   Cardiovascular: Negative for chest pain, palpitations and leg swelling.  Gastrointestinal: Negative for nausea.  Genitourinary: Negative.   Musculoskeletal: Negative for arthralgias, joint swelling, neck pain and neck stiffness.  Skin: Negative.  Negative for color change, rash and wound.  Neurological: Positive for weakness and numbness. Negative for dizziness, light-headedness and headaches.  Psychiatric/Behavioral: Negative.      Physical Exam Updated Vital Signs BP 122/79 (BP Location: Right Arm)   Pulse  76   Temp 97.9 F (36.6 C) (Oral)   Resp 19   Ht 5\' 1"  (1.549 m)   Wt 72.6 kg   SpO2 93%   BMI 30.23 kg/m   Physical Exam  Constitutional: She appears well-developed and well-nourished.  HENT:  Head: Normocephalic and atraumatic.  Eyes: Conjunctivae are normal.  Neck: Normal range of motion. Neck supple.  Cardiovascular: Normal rate, regular rhythm, normal heart sounds and intact distal pulses.  Pulmonary/Chest: Effort normal and breath sounds normal. No respiratory distress. She has no wheezes.  Abdominal: Soft. There is no tenderness.  Musculoskeletal: Normal range of motion. She exhibits no tenderness or deformity.  Neurological: She is alert. A sensory deficit is present.  Decreased sensation to fine touch in her  entire left hand and forearm along with lateral and medial upper arm.  She has no pronator drift.  Grip strengths 5 out of 5 right 4 out of 5 left.  Radial pulses are full and equal bilaterally.  She has no edema in either upper extremity. There is prominent engorgement of her veins volar left wrist. No tenderness, no induration or erythema.  Skin: Skin is warm and dry.  Psychiatric: She has a normal mood and affect.  Nursing note and vitals reviewed.    ED Treatments / Results  Labs (all labs ordered are listed, but only abnormal results are displayed) Labs Reviewed  D-DIMER, QUANTITATIVE (NOT AT [facility]) - Abnormal; Notable for the following components:      Result Value   D-Dimer, Quant 0.69 (*)    All other components within normal limits  CBC WITH DIFFERENTIAL/PLATELET  BASIC METABOLIC PANEL  TROPONIN I  CBG MONITORING, ED    EKG EKG Interpretation  Date/Time:  Monday June 21 2018 16:14:44 EDT Ventricular Rate:  64 PR Interval:  142 QRS Duration: 80 QT Interval:  450 QTC Calculation: 464 R Axis:   70 Text Interpretation:  Normal sinus rhythm Nonspecific ST abnormality Abnormal ECG Confirmed by Davonna Belling [phone]) on 06/21/2018 5:10:59  PM   Radiology Ct Head Wo Contrast  Result Date: 06/21/2018 CLINICAL DATA:  Numbness and tingling in LEFT arm for 1 week, no injury, at portion of skull removed years ago for an abnormal growth EXAM: CT HEAD WITHOUT CONTRAST CT CERVICAL SPINE WITHOUT CONTRAST TECHNIQUE: Multidetector CT imaging of the head and cervical spine was performed following the standard protocol without intravenous contrast. Multiplanar CT image reconstructions of the cervical spine were also generated. COMPARISON:  CT head 02/12/2010 FINDINGS: CT HEAD FINDINGS Brain: Mild atrophy. Normal ventricular morphology. No midline shift or mass effect. Normal appearance of brain parenchyma. No intracranial hemorrhage, mass lesion or evidence acute infarction. No extra-axial fluid collections. Vascular: No hyperdense vessels. Skull: Prior RIGHT frontal calvarial resection with cranioplasty, appearance stable. Associated frontal calvarial thickening. No new bone lesions. Sinuses/Orbits: Mucosal retention cyst RIGHT maxillary sinus. Chronic loss of RIGHT frontal sinus. Remaining visualized paranasal sinuses and LEFT mastoid air cells. Partial opacification of RIGHT mastoid air cells, chronic. Other: N/A CT CERVICAL SPINE FINDINGS Alignment: Normal Skull base and vertebrae: Mild osseous demineralization. Minimal anterior height loss at C5 appears old. Vertebral body and disc space heights otherwise maintained. No fracture, subluxation or bone destruction. Mild facet degenerative changes of the cervical and upper thoracic spine. Visualized skull base intact. Soft tissues and spinal canal: Prevertebral soft tissues normal thickness. Atherosclerotic calcifications of the carotid bifurcations. Disc levels: No additional abnormalities. No obvious disc herniation. Upper chest: Lung apices clear. Other: N/A IMPRESSION: Prior RIGHT frontal calvarial resection and cranioplasty. No acute intracranial abnormalities. Mild degenerative facet disease changes of  the cervical spine. No acute cervical spine abnormalities. Electronically Signed   By: Lavonia Dana M.D.   On: 06/21/2018 17:25   Ct Cervical Spine Wo Contrast  Result Date: 06/21/2018 CLINICAL DATA:  Numbness and tingling in LEFT arm for 1 week, no injury, at portion of skull removed years ago for an abnormal growth EXAM: CT HEAD WITHOUT CONTRAST CT CERVICAL SPINE WITHOUT CONTRAST TECHNIQUE: Multidetector CT imaging of the head and cervical spine was performed following the standard protocol without intravenous contrast. Multiplanar CT image reconstructions of the cervical spine were also generated. COMPARISON:  CT head 02/12/2010 FINDINGS: CT HEAD FINDINGS Brain: Mild atrophy. Normal ventricular morphology.  No midline shift or mass effect. Normal appearance of brain parenchyma. No intracranial hemorrhage, mass lesion or evidence acute infarction. No extra-axial fluid collections. Vascular: No hyperdense vessels. Skull: Prior RIGHT frontal calvarial resection with cranioplasty, appearance stable. Associated frontal calvarial thickening. No new bone lesions. Sinuses/Orbits: Mucosal retention cyst RIGHT maxillary sinus. Chronic loss of RIGHT frontal sinus. Remaining visualized paranasal sinuses and LEFT mastoid air cells. Partial opacification of RIGHT mastoid air cells, chronic. Other: N/A CT CERVICAL SPINE FINDINGS Alignment: Normal Skull base and vertebrae: Mild osseous demineralization. Minimal anterior height loss at C5 appears old. Vertebral body and disc space heights otherwise maintained. No fracture, subluxation or bone destruction. Mild facet degenerative changes of the cervical and upper thoracic spine. Visualized skull base intact. Soft tissues and spinal canal: Prevertebral soft tissues normal thickness. Atherosclerotic calcifications of the carotid bifurcations. Disc levels: No additional abnormalities. No obvious disc herniation. Upper chest: Lung apices clear. Other: N/A IMPRESSION: Prior RIGHT  frontal calvarial resection and cranioplasty. No acute intracranial abnormalities. Mild degenerative facet disease changes of the cervical spine. No acute cervical spine abnormalities. Electronically Signed   By: Lavonia Dana M.D.   On: 06/21/2018 17:25    Procedures Procedures (including critical care time)  Medications Ordered in ED Medications - No data to display   Initial Impression / Assessment and Plan / ED Course  I have reviewed the triage vital signs and the nursing notes.  Pertinent labs & imaging results that were available during my care of the patient were reviewed by me and considered in my medical decision making (see chart for details).     Pt with left upper extremity numbness, labs and imaging reviewed including elevated d dimer, but negative CT imaging, no cva, mild degenerative cervical disease.  Suspect this is a cervical radiculopathy.  However,  D dimer is elevated, will need doppler US to r/o dvt.  Discussed with Lily Kocher, PA who will dispo pt pending doppler US result.    Final Clinical Impressions(s) / ED Diagnoses   Final diagnoses:  None    ED Discharge Orders    None       Landis Martins 06/21/18 1744    Davonna Belling, MD 06/22/18 0001

## 2018-06-21 NOTE — Telephone Encounter (Signed)
Pt having left arm tingling/numbness, pain and swelling off and on for 2 weeks but worse today. Pt advised to go to ED for evaluation. Pt states she will go to [facility] ED.

## 2018-06-24 ENCOUNTER — Other Ambulatory Visit: Payer: Self-pay | Admitting: Family Medicine

## 2018-06-24 NOTE — Telephone Encounter (Signed)
This medicine was discontinued please notify pharmacy

## 2018-06-29 DIAGNOSIS — H5203 Hypermetropia, bilateral: Secondary | ICD-10-CM | POA: Diagnosis not present

## 2018-06-29 DIAGNOSIS — H52209 Unspecified astigmatism, unspecified eye: Secondary | ICD-10-CM | POA: Diagnosis not present

## 2018-06-29 DIAGNOSIS — H524 Presbyopia: Secondary | ICD-10-CM | POA: Diagnosis not present

## 2018-07-04 ENCOUNTER — Other Ambulatory Visit: Payer: Self-pay | Admitting: Family Medicine

## 2018-07-14 ENCOUNTER — Other Ambulatory Visit: Payer: Self-pay | Admitting: Family Medicine

## 2018-07-21 ENCOUNTER — Encounter: Payer: Self-pay | Admitting: Family Medicine

## 2018-07-21 ENCOUNTER — Ambulatory Visit (INDEPENDENT_AMBULATORY_CARE_PROVIDER_SITE_OTHER): Payer: Medicare HMO | Admitting: Family Medicine

## 2018-07-21 VITALS — BP 114/86 | Ht 61.0 in | Wt 163.6 lb

## 2018-07-21 DIAGNOSIS — E782 Mixed hyperlipidemia: Secondary | ICD-10-CM

## 2018-07-21 DIAGNOSIS — G5602 Carpal tunnel syndrome, left upper limb: Secondary | ICD-10-CM

## 2018-07-21 DIAGNOSIS — Z23 Encounter for immunization: Secondary | ICD-10-CM | POA: Diagnosis not present

## 2018-07-21 DIAGNOSIS — I10 Essential (primary) hypertension: Secondary | ICD-10-CM

## 2018-07-21 DIAGNOSIS — R5383 Other fatigue: Secondary | ICD-10-CM | POA: Diagnosis not present

## 2018-07-21 DIAGNOSIS — J449 Chronic obstructive pulmonary disease, unspecified: Secondary | ICD-10-CM

## 2018-07-21 DIAGNOSIS — R911 Solitary pulmonary nodule: Secondary | ICD-10-CM

## 2018-07-21 DIAGNOSIS — E038 Other specified hypothyroidism: Secondary | ICD-10-CM

## 2018-07-21 MED ORDER — PANTOPRAZOLE SODIUM 40 MG PO TBEC: 40.0000 mg | DELAYED_RELEASE_TABLET | Freq: Every day | ORAL | 2 refills | Status: DC

## 2018-07-21 NOTE — Progress Notes (Signed)
Subjective:    Patient ID: [name], female    DOB: 1961-01-17, 57 y.o.   MRN: [id number]  Hypertension  This is a chronic problem. Pertinent negatives include no chest pain, headaches or shortness of breath. There are no compliance problems.   Patient here for follow-up regarding cholesterol.  The patient does have hyperlipidemia.  Patient does try to maintain a reasonable diet.  Patient does take the medication on a regular basis.  Denies missing a dose.  The patient denies any obvious side effects.  Prior blood work results reviewed with the patient.  The patient is aware of his cholesterol goals and the need to keep it under good control to lessen the risk of disease.  Patient does have ongoing trouble with reflux.  Takes medication on a regular basis.  Tries to minimize foods as best they can.  They understand the importance of dietary compliance.  May also try to avoid eating a large meal close to bedtime.  Patient denies any dysphagia denies hematochezia.  States medicine does a good job keeping the problem under good control.  Without the medication may certainly have issues.They desire to continue taking their medication.  Patient has thyroid condition.  Takes thyroid medication on a regular basis.  States that the proper way.  Relates compliance.  States no negative side effects.  States condition seems to be under good control.  Patient has COPD uses her medications states overall is keeping things under good control  Patient has pulmonary nodule will need follow-up scan in November  Pt would like to be referred to a chiropractor. Dr. Bethann Goo is patient preference. Pt states her left arm keeps going to sleep.   Pt states provider sent her to ER to rule out blood clot; all test came back normal.   Review of Systems  Constitutional: Negative for activity change, fatigue and fever.  HENT: Negative for congestion and rhinorrhea.   Respiratory: Negative for cough, chest tightness  and shortness of breath.   Cardiovascular: Negative for chest pain and leg swelling.  Gastrointestinal: Negative for abdominal pain and nausea.  Skin: Negative for color change.  Neurological: Negative for dizziness and headaches.  Psychiatric/Behavioral: Negative for agitation and behavioral problems.       Objective:   Physical Exam  Constitutional: She appears well-developed and well-nourished. No distress.  HENT:  Head: Normocephalic and atraumatic.  Eyes: Right eye exhibits no discharge. Left eye exhibits no discharge.  Neck: No tracheal deviation present.  Cardiovascular: Normal rate, regular rhythm and normal heart sounds.  No murmur heard. Pulmonary/Chest: Effort normal and breath sounds normal. No respiratory distress. She has no wheezes. She has no rales.  Musculoskeletal: She exhibits no edema.  Lymphadenopathy:    She has no cervical adenopathy.  Neurological: She is alert. She exhibits normal muscle tone.  Skin: Skin is warm and dry. No erythema.  Psychiatric: Her behavior is normal.  Vitals reviewed.         Assessment & Plan:  Probable carpal tunnel nerve conduction study ordered for the left arm await the findings may need to hand specialist  COPD stable continue current measures doing well with this  The patient was seen today as part of an evaluation regarding hyperlipidemia.  Recent lab work has been reviewed with the patient as well as the goals for good cholesterol care.  In addition to this medications have been discussed the importance of compliance with diet and medications discussed as well.  Finally the  patient is aware that poor control of cholesterol, noncompliance can dramatically increase the risk of complications. The patient will keep regular office visits and the patient does agreed to periodic lab work.  Patient was seen today regarding hypothyroidism.  Importance of healthy diet, regular physical activity was discussed.  Importance of  compliance with medication and regular checks regarding this was discussed.   The patient was seen today for GERD. Patient benefits from medication. Patient to continue medication. Keep all regular follow ups.  Pulmonary nodule the clinic is supposed to do CAT scan at the end of November to help monitor this   25 minutes was spent with the patient.  This statement verifies that 25 minutes was indeed spent with the patient.  More than 50% of this visit-total duration of the visit-was spent in counseling and coordination of care. The issues that the patient came in for today as reflected in the diagnosis (s) please refer to documentation for further details.

## 2018-07-22 ENCOUNTER — Other Ambulatory Visit: Payer: Self-pay | Admitting: Acute Care

## 2018-07-22 DIAGNOSIS — Z122 Encounter for screening for malignant neoplasm of respiratory organs: Secondary | ICD-10-CM

## 2018-07-22 DIAGNOSIS — Z87891 Personal history of nicotine dependence: Secondary | ICD-10-CM

## 2018-07-23 ENCOUNTER — Other Ambulatory Visit: Payer: Self-pay | Admitting: Family Medicine

## 2018-07-23 DIAGNOSIS — E038 Other specified hypothyroidism: Secondary | ICD-10-CM | POA: Diagnosis not present

## 2018-07-23 DIAGNOSIS — R5383 Other fatigue: Secondary | ICD-10-CM | POA: Diagnosis not present

## 2018-07-23 DIAGNOSIS — E782 Mixed hyperlipidemia: Secondary | ICD-10-CM | POA: Diagnosis not present

## 2018-07-23 LAB — LIPID PANEL
CHOL/HDL RATIO: 3.2 (calc) (ref <5.0)
CHOLESTEROL: 156 mg/dL (ref <200)
HDL: 49 mg/dL — AB (ref >50)
LDL CHOLESTEROL (CALC): 80 mg/dL
NON-HDL CHOLESTEROL (CALC): 107 mg/dL (ref <130)
Triglycerides: 172 mg/dL — ABNORMAL HIGH (ref <150)

## 2018-07-23 LAB — TSH: TSH: 0.92 mIU/L (ref 0.40–4.50)

## 2018-07-23 LAB — LIPASE: Lipase: 37 U/L (ref 7–60)

## 2018-07-23 LAB — T4, FREE: Free T4: 1.2 ng/dL (ref 0.8–1.8)

## 2018-07-25 ENCOUNTER — Encounter: Payer: Self-pay | Admitting: Family Medicine

## 2018-08-02 ENCOUNTER — Encounter: Payer: Self-pay | Admitting: Family Medicine

## 2018-08-02 ENCOUNTER — Telehealth: Payer: Self-pay | Admitting: Family Medicine

## 2018-08-02 DIAGNOSIS — G5600 Carpal tunnel syndrome, unspecified upper limb: Secondary | ICD-10-CM

## 2018-08-02 NOTE — Telephone Encounter (Signed)
I put in as you requested.

## 2018-08-02 NOTE — Telephone Encounter (Signed)
Please initiate referral to neurology for a NCV for carpal tunnel syndrome  See OV note 07/21/18 - it was put in as an "order" and need to be put in as a neurology referral with NCV in the comments section so I may process & notify pt

## 2018-08-10 ENCOUNTER — Encounter: Payer: Self-pay | Admitting: Family Medicine

## 2018-08-12 ENCOUNTER — Other Ambulatory Visit: Payer: Self-pay | Admitting: Family Medicine

## 2018-08-23 ENCOUNTER — Ambulatory Visit (HOSPITAL_COMMUNITY)
Admission: RE | Admit: 2018-08-23 | Discharge: 2018-08-23 | Disposition: A | Discharge status: Home / Self Care | Payer: Medicare HMO | Source: Ambulatory Visit | Attending: Acute Care | Admitting: Acute Care

## 2018-08-23 DIAGNOSIS — Z87891 Personal history of nicotine dependence: Secondary | ICD-10-CM

## 2018-08-23 DIAGNOSIS — Z122 Encounter for screening for malignant neoplasm of respiratory organs: Secondary | ICD-10-CM | POA: Diagnosis not present

## 2018-08-23 DIAGNOSIS — J432 Centrilobular emphysema: Secondary | ICD-10-CM | POA: Diagnosis not present

## 2018-08-26 ENCOUNTER — Other Ambulatory Visit: Payer: Self-pay | Admitting: Acute Care

## 2018-08-26 DIAGNOSIS — Z122 Encounter for screening for malignant neoplasm of respiratory organs: Secondary | ICD-10-CM

## 2018-08-26 DIAGNOSIS — Z87891 Personal history of nicotine dependence: Secondary | ICD-10-CM

## 2018-08-30 DIAGNOSIS — M5412 Radiculopathy, cervical region: Secondary | ICD-10-CM | POA: Diagnosis not present

## 2018-08-30 DIAGNOSIS — G5602 Carpal tunnel syndrome, left upper limb: Secondary | ICD-10-CM | POA: Diagnosis not present

## 2018-08-30 DIAGNOSIS — I10 Essential (primary) hypertension: Secondary | ICD-10-CM | POA: Diagnosis not present

## 2018-09-13 ENCOUNTER — Other Ambulatory Visit: Payer: Self-pay | Admitting: Family Medicine

## 2018-09-14 ENCOUNTER — Other Ambulatory Visit: Payer: Self-pay | Admitting: Family Medicine

## 2018-10-12 ENCOUNTER — Other Ambulatory Visit: Payer: Self-pay | Admitting: Family Medicine

## 2018-10-14 DIAGNOSIS — G5602 Carpal tunnel syndrome, left upper limb: Secondary | ICD-10-CM | POA: Diagnosis not present

## 2018-10-14 DIAGNOSIS — M5412 Radiculopathy, cervical region: Secondary | ICD-10-CM | POA: Diagnosis not present

## 2018-10-17 ENCOUNTER — Telehealth: Payer: Self-pay | Admitting: Family Medicine

## 2018-10-17 NOTE — Telephone Encounter (Signed)
Nurses Please let the patient know that we did receive communication from neurology Dr.[place] states that the patient does have some mild carpal tunnel on the left wrist.  According to his note she may be interested in doing nerve injection for this.  If so this is typically done by orthopedics.  We can set her up with orthopedics of her choice.

## 2018-10-18 NOTE — Telephone Encounter (Signed)
I called and left a message to r/c. 

## 2018-10-18 NOTE — Telephone Encounter (Signed)
Discussed with pt. Pt states she has an appt to see dr Merlene Laughter on Thursday this week and he told her he would do the injection.

## 2018-10-18 NOTE — Telephone Encounter (Signed)
Sounds good thanks

## 2018-10-21 DIAGNOSIS — G5602 Carpal tunnel syndrome, left upper limb: Secondary | ICD-10-CM | POA: Diagnosis not present

## 2018-11-05 NOTE — Progress Notes (Signed)
Cardiology Office Note  Date: 11/09/2018   ID: IRIANA Myers, DOB 1960-10-11, MRN [id number]  PCP: Kathyrn Drown, MD  Primary Cardiologist: Rozann Lesches, MD   Chief Complaint  Patient presents with  . Coronary Artery Disease    History of Present Illness: [name] is a 58 y.o. female last seen in August 2019.  She is here for a routine follow-up visit.  From a cardiac perspective she does not report any angina symptoms, has not used any nitroglycerin.  She [place] to the beach about once a month.  Still following regularly with Dr. Wolfgang Phoenix.  I did review her interval lab work as outlined below, LDL was 80 in November 2019.  We went over her cardiac medications which are stable and outlined below.  Last ischemic testing was in March of last year, low risk Myoview.  Past Medical History:  Diagnosis Date  . Coronary atherosclerosis of native coronary artery    DES RCA 5/09, nonobstructive left system  . Essential hypertension, benign   . Hypothyroidism   . Mixed hyperlipidemia   . NSTEMI (non-ST elevated myocardial infarction) ([place])    2009    Past Surgical History:  Procedure Laterality Date  . BREAST LUMPECTOMY    . COLONOSCOPY N/A 05/26/2018   Procedure: COLONOSCOPY;  Surgeon: Rogene Houston, MD;  [facility]: [facility];  Service: Endoscopy;  Laterality: N/A;  1030  . POLYPECTOMY  05/26/2018   Procedure: POLYPECTOMY;  Surgeon: Rogene Houston, MD;  Location: [facility];  Service: Endoscopy;;  colon  . TOTAL ABDOMINAL HYSTERECTOMY      Current Outpatient Medications  Medication Sig Dispense Refill  . aspirin 81 MG EC tablet Take 81 mg by mouth at bedtime.     . Cholecalciferol (VITAMIN D3) 400 units CAPS Take 400 Units by mouth at bedtime.     Marland Kitchen dexamethasone (DECADRON) 4 MG tablet Take 1 tablet (4 mg total) by mouth 2 (two) times daily with a meal. 10 tablet 0  . Fluticasone-Umeclidin-Vilant (TRELEGY ELLIPTA) 100-62.5-25 MCG/INH AEPB Inhale 1  Inhaler into the lungs daily. Rinse mouth after use. 1 each 12  . HYDROcodone-acetaminophen (NORCO/VICODIN) 5-325 MG tablet Take 1 tablet by mouth every 4 (four) hours as needed. 12 tablet 0  . levothyroxine (SYNTHROID, LEVOTHROID) 125 MCG tablet TAKE 1/2 TABLET BY MOUTH EVERY MONDAY AND 1 TABLET ONCE DAILY ON ALL OTHER DAYS 90 tablet 0  . metoprolol tartrate (LOPRESSOR) 25 MG tablet TAKE 0.5 TABLETS (12.5 MG TOTAL) BY MOUTH 2 (TWO) TIMES DAILY. 90 tablet 1  . nitroGLYCERIN (NITROSTAT) 0.4 MG SL tablet Place 1 tablet (0.4 mg total) under the tongue every 5 (five) minutes x 3 doses as needed. (Patient taking differently: Place 0.4 mg under the tongue every 5 (five) minutes x 3 doses as needed for chest pain. ) 25 tablet 3  . simvastatin (ZOCOR) 40 MG tablet TAKE 1 TABLET BY MOUTH EVERYDAY AT BEDTIME 90 tablet 1   No current facility-administered medications for this visit.    Allergies:  Patient has no known allergies.   Social History: The patient  reports that she quit smoking about 13 years ago. Her smoking use included cigarettes. She started smoking about 46 years ago. She has a 66.00 pack-year smoking history. She has never used smokeless tobacco. She reports that she does not drink alcohol or use drugs.   ROS:  Please see the history of present illness. Otherwise, complete review of systems is positive for  none.  All other systems are reviewed and negative.   Physical Exam: VS:  BP 124/70   Pulse (!) 57   Ht 5\' 1"  (1.549 m)   Wt 158 lb (71.7 kg)   SpO2 94%   BMI 29.85 kg/m , BMI Body mass index is 29.85 kg/m.  Wt Readings from Last 3 Encounters:  11/09/18 158 lb (71.7 kg)  07/21/18 163 lb 9.6 oz (74.2 kg)  06/21/18 160 lb (72.6 kg)    General: Patient appears comfortable at rest. HEENT: Conjunctiva and lids normal, oropharynx clear. Neck: Supple, no elevated JVP or carotid bruits, no thyromegaly. Lungs: Decreased breath sounds, nonlabored breathing at rest. Cardiac: Regular  rate and rhythm, no S3 or significant systolic murmur, no pericardial rub. Abdomen: Soft, nontender, bowel sounds present. Extremities: No pitting edema, distal pulses 2+. Skin: Warm and dry. Musculoskeletal: No kyphosis. Neuropsychiatric: Alert and oriented x3, affect grossly appropriate.  ECG: I personally reviewed the tracing from 06/21/2018 which showed sinus rhythm with nonspecific ST changes.  Recent Labwork: 06/21/2018: BUN 14; Creatinine, Ser 0.72; Hemoglobin 13.8; Platelets 215; Potassium 3.9; Sodium 140 07/23/2018: TSH 0.92     Component Value Date/Time   CHOL 156 07/23/2018 0715   CHOL 155 01/08/2016 0809   TRIG 172 (H) 07/23/2018 0715   HDL 49 (L) 07/23/2018 0715   HDL 53 01/08/2016 0809   CHOLHDL 3.2 07/23/2018 0715   VLDL 22 01/20/2017 0724   LDLCALC 80 07/23/2018 0715    Other Studies Reviewed Today:  [facility] Myoview 12/15/2017:  The study is normal. No myocardial ischemia or scar.  Nuclear stress EF: 62%.  This is a low risk study.  Chest CT 08/23/2018: IMPRESSION: 1. Lung-RADS 2S, benign appearance or behavior. Continue annual screening with low-dose chest CT without contrast in 12 months. 2. The "S" modifier above refers to potentially clinically significant non lung cancer related findings. Specifically, there is aortic atherosclerosis, in addition to left main and 3 vessel coronary artery disease. Please note that although the presence of coronary artery calcium documents the presence of coronary artery disease, the severity of this disease and any potential stenosis cannot be assessed on this non-gated CT examination. Assessment for potential risk factor modification, dietary therapy or pharmacologic therapy may be warranted, if clinically indicated. 3. Mild diffuse bronchial wall thickening with mild to moderate centrilobular and paraseptal emphysema; imaging findings suggestive of underlying COPD.  Assessment and Plan:  1.  CAD status post DES  to the RCA in 2009.  She is doing well on medical therapy and underwent a Lexiscan Myoview last year that was low risk.  No changes made today.  2.  Mixed hyperlipidemia, on Zocor.  Last LDL was 80.  3.  Essential hypertension, blood pressure is well controlled today.  She continues on Lopressor.  4.  Left upper lobe nodules followed by Pulmonary.  Chest CT from December 2019 showed overall stable findings.  She is asymptomatic.  Current medicines were reviewed with the patient today.  Disposition: Follow-up in 1 year.  Signed, Satira Sark, MD, [facility] 11/09/2018 8:53 AM    [facility] [place], [place], [place] [address] Phone: [phone]; Fax: [phone]

## 2018-11-09 ENCOUNTER — Encounter: Payer: Self-pay | Admitting: Cardiology

## 2018-11-09 ENCOUNTER — Ambulatory Visit: Payer: Medicare HMO | Admitting: Cardiology

## 2018-11-09 VITALS — BP 124/70 | HR 57 | Ht 61.0 in | Wt 158.0 lb

## 2018-11-09 DIAGNOSIS — E782 Mixed hyperlipidemia: Secondary | ICD-10-CM

## 2018-11-09 DIAGNOSIS — R911 Solitary pulmonary nodule: Secondary | ICD-10-CM | POA: Diagnosis not present

## 2018-11-09 DIAGNOSIS — I10 Essential (primary) hypertension: Secondary | ICD-10-CM

## 2018-11-09 DIAGNOSIS — I25119 Atherosclerotic heart disease of native coronary artery with unspecified angina pectoris: Secondary | ICD-10-CM | POA: Diagnosis not present

## 2018-11-09 NOTE — Patient Instructions (Addendum)

## 2018-11-16 ENCOUNTER — Ambulatory Visit (INDEPENDENT_AMBULATORY_CARE_PROVIDER_SITE_OTHER): Payer: Medicare HMO | Admitting: Family Medicine

## 2018-11-16 ENCOUNTER — Encounter: Payer: Self-pay | Admitting: Family Medicine

## 2018-11-16 VITALS — BP 118/76 | Temp 98.8°F | Ht 61.0 in | Wt 161.6 lb

## 2018-11-16 DIAGNOSIS — J449 Chronic obstructive pulmonary disease, unspecified: Secondary | ICD-10-CM

## 2018-11-16 DIAGNOSIS — J441 Chronic obstructive pulmonary disease with (acute) exacerbation: Secondary | ICD-10-CM | POA: Diagnosis not present

## 2018-11-16 MED ORDER — PREDNISONE 20 MG PO TABS: ORAL_TABLET | ORAL | 0 refills | Status: DC

## 2018-11-16 MED ORDER — DOXYCYCLINE HYCLATE 100 MG PO CAPS: 100.0000 mg | ORAL_CAPSULE | Freq: Two times a day (BID) | ORAL | 0 refills | Status: DC

## 2018-11-16 NOTE — Progress Notes (Signed)
   Subjective:    Patient ID: [name], female    DOB: April 05, 1961, 58 y.o.   MRN: [id number]  Sinusitis  This is a new problem. Episode onset: 5 days. Associated symptoms include congestion, coughing and headaches. Pertinent negatives include no ear pain or shortness of breath. (Wheezing) Treatments tried: amoxil 500mg , prednisone 10mg  for 4 days.   Significant congestion coughing some wheezing some shortness of breath denies high fever chills sweats, having some wheezing but no difficulty breathing but does relate increased coughing wheezing   Review of Systems  Constitutional: Negative for activity change and fever.  HENT: Positive for congestion and rhinorrhea. Negative for ear pain.   Eyes: Negative for discharge.  Respiratory: Positive for cough. Negative for shortness of breath and wheezing.   Cardiovascular: Negative for chest pain.  Neurological: Positive for headaches.       Objective:   Physical Exam Vitals signs and nursing note reviewed.  Constitutional:      Appearance: She is well-developed.  HENT:     Head: Normocephalic.     Nose: Nose normal.     Mouth/Throat:     Pharynx: No oropharyngeal exudate.  Neck:     Musculoskeletal: Neck supple.  Cardiovascular:     Rate and Rhythm: Normal rate.     Heart sounds: Normal heart sounds. No murmur.  Pulmonary:     Effort: Pulmonary effort is normal.     Breath sounds: Normal breath sounds. No wheezing.  Lymphadenopathy:     Cervical: No cervical adenopathy.  Skin:    General: Skin is warm and dry.           Assessment & Plan:  COPD exacerbation antibiotics prednisone warning signs discussed in detail follow-up if progressive troubles 15 minutes was spent with patient today discussing healthcare issues which they came.  More than 50% of this visit-total duration of visit-was spent in counseling and coordination of care.  Please see diagnosis regarding the focus of this coordination and care

## 2018-12-28 ENCOUNTER — Other Ambulatory Visit: Payer: Self-pay

## 2018-12-28 ENCOUNTER — Ambulatory Visit (INDEPENDENT_AMBULATORY_CARE_PROVIDER_SITE_OTHER): Payer: Medicare HMO | Admitting: Family Medicine

## 2018-12-28 DIAGNOSIS — J301 Allergic rhinitis due to pollen: Secondary | ICD-10-CM | POA: Diagnosis not present

## 2018-12-28 DIAGNOSIS — J449 Chronic obstructive pulmonary disease, unspecified: Secondary | ICD-10-CM | POA: Diagnosis not present

## 2018-12-28 MED ORDER — PREDNISONE 20 MG PO TABS: ORAL_TABLET | ORAL | 0 refills | Status: DC

## 2018-12-28 MED ORDER — AMOXICILLIN-POT CLAVULANATE 875-125 MG PO TABS: 1.0000 | ORAL_TABLET | Freq: Two times a day (BID) | ORAL | 0 refills | Status: DC

## 2018-12-28 NOTE — Progress Notes (Signed)
   Subjective:    Patient ID: [name], female    DOB: 1960/10/29, 58 y.o.   MRN: [id number]  HPI  Patient reports a flare of her COPD- some wheezing in the am and chest congestion. Patient states it started about a week ago. No fever Significant history of COPD taking all of her medicines but relates some wheezing in the morning also a lot of chest congestion bringing up a lot of discolored phlegm also has allergy issues denies high fever chills sweats denies severe difficulty breathing.  No vomiting or diarrhea.  No other complication. Virtual Visit via Video Note Patient did not have video component available she was at home I was here at the office I connected with [name] on 12/28/18 at 11:30 AM EDT by a video enabled telemedicine application and verified that I am speaking with the correct person using two identifiers.   I discussed the limitations of evaluation and management by telemedicine and the availability of in person appointments. The patient expressed understanding and agreed to proceed.  History of Present Illness:    Observations/Objective:   Assessment and Plan:   Follow Up Instructions:    I discussed the assessment and treatment plan with the patient. The patient was provided an opportunity to ask questions and all were answered. The patient agreed with the plan and demonstrated an understanding of the instructions.   The patient was advised to call back or seek an in-person evaluation if the symptoms worsen or if the condition fails to improve as anticipated.  I provided 15 minutes of non-face-to-face time during this encounter.       Review of Systems     Objective:   Physical Exam  Patient unable to do physical exam via telephone      Assessment & Plan:  COPD exacerbation Prednisone sent and I told the patient to hold off on the prednisone unless she absolutely has to use it because of fear that it could increase her risk of  coronavirus infection Also recommend the antibiotic go ahead and take that on a regular basis over the course the next 10 days call us back if progressive troubles or worse warning signs discussed with the patient

## 2019-01-15 ENCOUNTER — Other Ambulatory Visit: Payer: Self-pay | Admitting: Family Medicine

## 2019-01-21 ENCOUNTER — Ambulatory Visit: Payer: Medicare HMO | Admitting: Family Medicine

## 2019-02-02 ENCOUNTER — Other Ambulatory Visit: Payer: Self-pay | Admitting: Family Medicine

## 2019-02-16 ENCOUNTER — Other Ambulatory Visit: Payer: Self-pay

## 2019-02-16 ENCOUNTER — Ambulatory Visit (INDEPENDENT_AMBULATORY_CARE_PROVIDER_SITE_OTHER): Payer: Medicare HMO | Admitting: Family Medicine

## 2019-02-16 ENCOUNTER — Encounter: Payer: Self-pay | Admitting: Family Medicine

## 2019-02-16 VITALS — BP 123/71 | Temp 97.0°F | Wt 155.0 lb

## 2019-02-16 DIAGNOSIS — I10 Essential (primary) hypertension: Secondary | ICD-10-CM | POA: Diagnosis not present

## 2019-02-16 DIAGNOSIS — J449 Chronic obstructive pulmonary disease, unspecified: Secondary | ICD-10-CM | POA: Diagnosis not present

## 2019-02-16 DIAGNOSIS — E782 Mixed hyperlipidemia: Secondary | ICD-10-CM | POA: Diagnosis not present

## 2019-02-16 DIAGNOSIS — E038 Other specified hypothyroidism: Secondary | ICD-10-CM | POA: Diagnosis not present

## 2019-02-16 MED ORDER — METOPROLOL TARTRATE 25 MG PO TABS: 12.5000 mg | ORAL_TABLET | Freq: Two times a day (BID) | ORAL | 1 refills | Status: DC

## 2019-02-16 MED ORDER — LEVOTHYROXINE SODIUM 125 MCG PO TABS: ORAL_TABLET | ORAL | 1 refills | Status: DC

## 2019-02-16 NOTE — Progress Notes (Signed)
Subjective:     Patient ID: [name], female   DOB: [birth year], 58 y.o.   MRN: [id number]  Hypertension  This is a chronic problem. Pertinent negatives include no chest pain, headaches or shortness of breath. (Pt states that she has been doing well. Pt BP this morning at home 123/71. ) There are no compliance problems.     Pt states that she has been getting out and walking with grandson. Pt states she was having some allergy issues but with ABTs and Mucinex, she is feeling much better.  Virtual Visit via Video Note  I connected with [name] on 02/16/19 at  9:00 AM EDT by a video enabled telemedicine application and verified that I am speaking with the correct person using two identifiers.  Location: Patient: home Provider: office   I discussed the limitations of evaluation and management by telemedicine and the availability of in person appointments. The patient expressed understanding and agreed to proceed.  History of Present Illness: Patient has thyroid condition.  Takes thyroid medication on a regular basis.  States that the proper way.  Relates compliance.  States no negative side effects.  States condition seems to be under good control.  Patient here for follow-up regarding cholesterol.  The patient does have hyperlipidemia.  Patient does try to maintain a reasonable diet.  Patient does take the medication on a regular basis.  Denies missing a dose.  The patient denies any obvious side effects.  Prior blood work results reviewed with the patient.  The patient is aware of his cholesterol goals and the need to keep it under good control to lessen the risk of disease.  Patient does have COPD she uses her inhaler on a regular basis.  She denies any flareups since the last one that she was seen for a few weeks ago  We did discuss coronavirus protection   Observations/Objective:   Assessment and Plan:   Follow Up Instructions:    I discussed the assessment and  treatment plan with the patient. The patient was provided an opportunity to ask questions and all were answered. The patient agreed with the plan and demonstrated an understanding of the instructions.   The patient was advised to call back or seek an in-person evaluation if the symptoms worsen or if the condition fails to improve as anticipated.  I provided 15 minutes of non-face-to-face time during this encounter.   Vicente Males, LPN   Review of Systems  Constitutional: Negative for activity change, fatigue and fever.  HENT: Negative for congestion and rhinorrhea.   Respiratory: Negative for cough, chest tightness and shortness of breath.   Cardiovascular: Negative for chest pain and leg swelling.  Gastrointestinal: Negative for abdominal pain and nausea.  Skin: Negative for color change.  Neurological: Negative for dizziness and headaches.  Psychiatric/Behavioral: Negative for agitation and behavioral problems.       Objective:   Physical Exam Today's visit was via telephone Physical exam was not possible for this visit     Assessment:     HTN COPD Hyperlipidemia Hypothyroidism    Plan:     Medications discussed Diet discussed Exercise discussed Patient will follow-up in the fall do lab work before them immunizations and follow-up Hypertension decent control continue current measures Underlying history of heart disease continue 81 mg aspirin metoprolol Hypothyroidism continue current medication refill sent in check lab work later this summer Hyperlipidemia check lab work later this summer COPD stable continue current measures Continue to  exercise Protection against coronavirus discussed

## 2019-02-25 ENCOUNTER — Ambulatory Visit: Payer: Medicare HMO | Admitting: Family Medicine

## 2019-03-14 DIAGNOSIS — C44722 Squamous cell carcinoma of skin of right lower limb, including hip: Secondary | ICD-10-CM | POA: Diagnosis not present

## 2019-03-14 DIAGNOSIS — D0439 Carcinoma in situ of skin of other parts of face: Secondary | ICD-10-CM | POA: Diagnosis not present

## 2019-04-27 DIAGNOSIS — Z08 Encounter for follow-up examination after completed treatment for malignant neoplasm: Secondary | ICD-10-CM | POA: Diagnosis not present

## 2019-04-27 DIAGNOSIS — Z85828 Personal history of other malignant neoplasm of skin: Secondary | ICD-10-CM | POA: Diagnosis not present

## 2019-04-27 DIAGNOSIS — B078 Other viral warts: Secondary | ICD-10-CM | POA: Diagnosis not present

## 2019-05-18 ENCOUNTER — Other Ambulatory Visit: Payer: Self-pay | Admitting: Family Medicine

## 2019-05-18 DIAGNOSIS — E782 Mixed hyperlipidemia: Secondary | ICD-10-CM | POA: Diagnosis not present

## 2019-05-18 DIAGNOSIS — E038 Other specified hypothyroidism: Secondary | ICD-10-CM | POA: Diagnosis not present

## 2019-05-18 DIAGNOSIS — I10 Essential (primary) hypertension: Secondary | ICD-10-CM | POA: Diagnosis not present

## 2019-05-18 LAB — HEPATIC FUNCTION PANEL
AG Ratio: 2 (calc) (ref 1.0–2.5)
ALT: 14 U/L (ref 6–29)
AST: 17 U/L (ref 10–35)
Albumin: 4.4 g/dL (ref 3.6–5.1)
Alkaline phosphatase (APISO): 70 U/L (ref 37–153)
Bilirubin, Direct: 0.1 mg/dL (ref 0.0–0.2)
Globulin: 2.2 g/dL (calc) (ref 1.9–3.7)
Indirect Bilirubin: 0.6 mg/dL (calc) (ref 0.2–1.2)
Total Bilirubin: 0.7 mg/dL (ref 0.2–1.2)
Total Protein: 6.6 g/dL (ref 6.1–8.1)

## 2019-05-18 LAB — LIPID PANEL
Cholesterol: 132 mg/dL (ref <200)
HDL: 51 mg/dL (ref >=50)
LDL Cholesterol (Calc): 60 mg/dL (calc)
Non-HDL Cholesterol (Calc): 81 mg/dL (calc) (ref <130)
Total CHOL/HDL Ratio: 2.6 (calc) (ref <5.0)
Triglycerides: 120 mg/dL (ref <150)

## 2019-05-18 LAB — TSH: TSH: 0.82 mIU/L (ref 0.40–4.50)

## 2019-05-18 LAB — BASIC METABOLIC PANEL WITH GFR
BUN: 13 mg/dL (ref 7–25)
CO2: 30 mmol/L (ref 20–32)
Calcium: 9.3 mg/dL (ref 8.6–10.4)
Chloride: 106 mmol/L (ref 98–110)
Creat: 0.81 mg/dL (ref 0.50–0.99)
GFR, Est African American: 91 mL/min/{1.73_m2} (ref >=60)
GFR, Est Non African American: 79 mL/min/{1.73_m2} (ref >=60)
Glucose, Bld: 98 mg/dL (ref 65–99)
Potassium: 4.4 mmol/L (ref 3.5–5.3)
Sodium: 141 mmol/L (ref 135–146)

## 2019-05-19 ENCOUNTER — Encounter: Payer: Self-pay | Admitting: Family Medicine

## 2019-06-14 DIAGNOSIS — R69 Illness, unspecified: Secondary | ICD-10-CM | POA: Diagnosis not present

## 2019-06-20 ENCOUNTER — Other Ambulatory Visit: Payer: Self-pay

## 2019-06-20 ENCOUNTER — Ambulatory Visit (INDEPENDENT_AMBULATORY_CARE_PROVIDER_SITE_OTHER): Payer: Medicare HMO | Admitting: Family Medicine

## 2019-06-20 VITALS — BP 120/67 | HR 57 | Wt 130.0 lb

## 2019-06-20 DIAGNOSIS — E782 Mixed hyperlipidemia: Secondary | ICD-10-CM | POA: Diagnosis not present

## 2019-06-20 DIAGNOSIS — J449 Chronic obstructive pulmonary disease, unspecified: Secondary | ICD-10-CM | POA: Diagnosis not present

## 2019-06-20 DIAGNOSIS — E038 Other specified hypothyroidism: Secondary | ICD-10-CM | POA: Diagnosis not present

## 2019-06-20 DIAGNOSIS — I25119 Atherosclerotic heart disease of native coronary artery with unspecified angina pectoris: Secondary | ICD-10-CM | POA: Diagnosis not present

## 2019-06-20 NOTE — Progress Notes (Signed)
   Subjective:    Patient ID: [name], female    DOB: [birth year], 58 y.o.   MRN: [id number] Telephone visit video was not possible Hypertension This is a chronic problem. Associated symptoms include shortness of breath. Pertinent negatives include no chest pain or headaches. There are no compliance problems.   pt states she has been doing well. Pt has lost a few pounds and states that she can see a difference in her breathing.   Pt states that her Trelogy is going to be expensive and would like to know if anything similar can be called in and what we can do.  She does have COPD she cannot afford the newer inhalers.  We did discuss the possibility of using Pulmicort.  She does think she can get some samples for a few days  Thyroid medicine overall doing well not having any problems Cholesterol medicine not having any problems Virtual Visit via Video Note  I connected with [name] on 06/20/19 at 10:00 AM EDT by a video enabled telemedicine application and verified that I am speaking with the correct person using two identifiers.  Location: Patient: home Provider: office   I discussed the limitations of evaluation and management by telemedicine and the availability of in person appointments. The patient expressed understanding and agreed to proceed.  History of Present Illness:    Observations/Objective:   Assessment and Plan:   Follow Up Instructions:    I discussed the assessment and treatment plan with the patient. The patient was provided an opportunity to ask questions and all were answered. The patient agreed with the plan and demonstrated an understanding of the instructions.   The patient was advised to call back or seek an in-person evaluation if the symptoms worsen or if the condition fails to improve as anticipated.  I provided 20 minutes of non-face-to-face time during this encounter.   Vicente Males, LPN     Review of Systems  Constitutional:  Negative for activity change, fatigue and fever.  HENT: Negative for congestion and rhinorrhea.   Respiratory: Positive for shortness of breath. Negative for cough and chest tightness.   Cardiovascular: Negative for chest pain and leg swelling.  Gastrointestinal: Negative for abdominal pain and nausea.  Skin: Negative for color change.  Neurological: Negative for dizziness and headaches.  Psychiatric/Behavioral: Negative for agitation and behavioral problems.       Objective:   Physical Exam  Today's visit was via telephone Physical exam was not possible for this visit       Assessment & Plan:  COPD Patient having a hard time affording her inhaler She will continue using her samples but if she runs out she will consider Pulmicort that we would use twice daily Patient will do a follow-up in 4 months

## 2019-07-27 ENCOUNTER — Other Ambulatory Visit: Payer: Self-pay | Admitting: Family Medicine

## 2019-08-08 DIAGNOSIS — R69 Illness, unspecified: Secondary | ICD-10-CM | POA: Diagnosis not present

## 2019-08-09 ENCOUNTER — Telehealth: Payer: Self-pay | Admitting: *Deleted

## 2019-08-09 DIAGNOSIS — X32XXXD Exposure to sunlight, subsequent encounter: Secondary | ICD-10-CM | POA: Diagnosis not present

## 2019-08-09 DIAGNOSIS — L821 Other seborrheic keratosis: Secondary | ICD-10-CM | POA: Diagnosis not present

## 2019-08-09 DIAGNOSIS — L820 Inflamed seborrheic keratosis: Secondary | ICD-10-CM | POA: Diagnosis not present

## 2019-08-09 DIAGNOSIS — L570 Actinic keratosis: Secondary | ICD-10-CM | POA: Diagnosis not present

## 2019-08-09 NOTE — Telephone Encounter (Signed)
Pt dropped off handicapp parking permit. Form in dr scott's folder.

## 2019-08-15 NOTE — Telephone Encounter (Signed)
This was filled out thank you 

## 2019-08-22 ENCOUNTER — Other Ambulatory Visit: Payer: Self-pay

## 2019-08-22 ENCOUNTER — Encounter: Payer: Self-pay | Admitting: Family Medicine

## 2019-08-22 ENCOUNTER — Ambulatory Visit (INDEPENDENT_AMBULATORY_CARE_PROVIDER_SITE_OTHER): Payer: Medicare HMO | Admitting: Family Medicine

## 2019-08-22 DIAGNOSIS — J441 Chronic obstructive pulmonary disease with (acute) exacerbation: Secondary | ICD-10-CM | POA: Diagnosis not present

## 2019-08-22 MED ORDER — AMOXICILLIN-POT CLAVULANATE 875-125 MG PO TABS: 1.0000 | ORAL_TABLET | Freq: Two times a day (BID) | ORAL | 0 refills | Status: AC

## 2019-08-22 NOTE — Progress Notes (Signed)
   Subjective:  Audio plus video  Patient ID: [name], female    DOB: 1960/12/18, 58 y.o.   MRN: [id number]  Cough This is a new problem. The current episode started in the past 7 days. Associated symptoms include nasal congestion.   Patient states she feels like she has thick congestion in her chest and hard to cough it up- has history of COPD.   Review of Systems  Respiratory: Positive for cough.    Virtual Visit via Video Note  I connected with [name] on 08/22/19 at  3:00 PM EST by a video enabled telemedicine application and verified that I am speaking with the correct person using two identifiers.  Location: Patient: home Provider: office   I discussed the limitations of evaluation and management by telemedicine and the availability of in person appointments. The patient expressed understanding and agreed to proceed.  History of Present Illness:    Observations/Objective:   Assessment and Plan:   Follow Up Instructions:    I discussed the assessment and treatment plan with the patient. The patient was provided an opportunity to ask questions and all were answered. The patient agreed with the plan and demonstrated an understanding of the instructions.   The patient was advised to call back or seek an in-person evaluation if the symptoms worsen or if the condition fails to improve as anticipated.  I provided 18  minutes of non-face-to-face time during this encounter.  No fever no ache no tast abnormalities  Patient feels this is just one of her typical COPD flares.  No known exposure to anyone else sick. No vomiting no diarrhea no rash    Objective:   Physical Exam   Virtual     Assessment & Plan:  Impression exacerbation of COPD.  Will cover with antibiotics symptom care discussed albuterol.  For wheezing warning signs discussed.  Patient wishes to hold off on COVID-19 testing warning signs discussed regarding this

## 2019-09-01 DIAGNOSIS — R69 Illness, unspecified: Secondary | ICD-10-CM | POA: Diagnosis not present

## 2019-09-10 ENCOUNTER — Other Ambulatory Visit: Payer: Self-pay | Admitting: Family Medicine

## 2019-09-23 ENCOUNTER — Other Ambulatory Visit: Payer: Self-pay | Admitting: Family Medicine

## 2019-09-26 NOTE — Telephone Encounter (Signed)
Last med check up 06/20/19

## 2019-09-26 NOTE — Telephone Encounter (Signed)
May have 5 refills 

## 2019-09-27 ENCOUNTER — Other Ambulatory Visit: Payer: Self-pay

## 2019-09-27 ENCOUNTER — Ambulatory Visit (HOSPITAL_COMMUNITY)
Admission: RE | Admit: 2019-09-27 | Discharge: 2019-09-27 | Disposition: A | Discharge status: Home / Self Care | Payer: Medicare HMO | Source: Ambulatory Visit | Attending: Acute Care | Admitting: Acute Care

## 2019-09-27 DIAGNOSIS — Z87891 Personal history of nicotine dependence: Secondary | ICD-10-CM | POA: Insufficient documentation

## 2019-09-27 DIAGNOSIS — Z122 Encounter for screening for malignant neoplasm of respiratory organs: Secondary | ICD-10-CM | POA: Diagnosis not present

## 2019-09-29 ENCOUNTER — Other Ambulatory Visit: Payer: Self-pay | Admitting: *Deleted

## 2019-09-29 DIAGNOSIS — Z87891 Personal history of nicotine dependence: Secondary | ICD-10-CM

## 2019-10-11 DIAGNOSIS — L570 Actinic keratosis: Secondary | ICD-10-CM | POA: Diagnosis not present

## 2019-10-11 DIAGNOSIS — X32XXXD Exposure to sunlight, subsequent encounter: Secondary | ICD-10-CM | POA: Diagnosis not present

## 2019-10-21 ENCOUNTER — Other Ambulatory Visit: Payer: Self-pay | Admitting: Family Medicine

## 2019-10-21 NOTE — Telephone Encounter (Signed)
Last med check up 06/20/19

## 2019-10-23 ENCOUNTER — Other Ambulatory Visit: Payer: Self-pay | Admitting: Family Medicine

## 2019-10-26 ENCOUNTER — Telehealth: Payer: Self-pay | Admitting: Cardiology

## 2019-10-26 ENCOUNTER — Encounter: Payer: Self-pay | Admitting: Cardiology

## 2019-10-26 NOTE — Progress Notes (Signed)
Virtual Visit via Telephone Note   This visit type was conducted due to national recommendations for restrictions regarding the COVID-19 Pandemic (e.g. social distancing) in an effort to limit this patient's exposure and mitigate transmission in our community.  Due to her co-morbid illnesses, this patient is at least at moderate risk for complications without adequate follow up.  This format is felt to be most appropriate for this patient at this time.  The patient did not have access to video technology/had technical difficulties with video requiring transitioning to audio format only (telephone).  All issues noted in this document were discussed and addressed.  No physical exam could be performed with this format.  Please refer to the patient's chart for her  consent to telehealth for [facility].  Date:  10/27/2019   ID:  [name], DOB [birth year], MRN [id number]  Patient Location: Home Provider Location: Office  PCP:  Kathyrn Drown, MD  Cardiologist:  Rozann Lesches, MD Electrophysiologist:  None   Evaluation Performed:  Follow-Up Visit  Chief Complaint:  Cardiac follow-up  History of Present Illness:    [name] is a 59 y.o. female last seen in February 2020.  We spoke by phone today.  She states that she is doing very well.  Through combination of walking, cutting back carbohydrates, and limiting sodas, she has lost 30 pounds over the last year and has been able to maintain this so far.  She does not report any exertional angina or nitroglycerin use.  I reviewed her medications which are outlined below.  Cardiac regimen includes aspirin, Lopressor, Zocor, and as needed nitroglycerin.  She had lab work in August 2020 at which point her LDL was 60.  She continues to follow with Dr. Wolfgang Phoenix.  The patient does not have symptoms concerning for COVID-19 infection (fever, chills, cough, or new shortness of breath).    Past Medical History:  Diagnosis Date  .  Coronary atherosclerosis of native coronary artery    DES RCA 5/09, nonobstructive left system  . Essential hypertension   . Hypothyroidism   . Mixed hyperlipidemia   . NSTEMI (non-ST elevated myocardial infarction) ([place])    2009   Past Surgical History:  Procedure Laterality Date  . BREAST LUMPECTOMY    . COLONOSCOPY N/A 05/26/2018   Procedure: COLONOSCOPY;  Surgeon: Rogene Houston, MD;  [facility];  Service: Endoscopy;  Laterality: N/A;  1030  . POLYPECTOMY  05/26/2018   Procedure: POLYPECTOMY;  Surgeon: Rogene Houston, MD;  Location: [facility];  Service: Endoscopy;;  colon  . TOTAL ABDOMINAL HYSTERECTOMY       Current Meds  Medication Sig  . aspirin 81 MG EC tablet Take 81 mg by mouth at bedtime.   . Cholecalciferol (VITAMIN D3) 400 units CAPS Take 400 Units by mouth at bedtime.   Marland Kitchen levothyroxine (SYNTHROID) 125 MCG tablet 1 qam all days  Except Monday use 1/2 in the am  . loratadine (CLARITIN) 10 MG tablet Take 10 mg by mouth daily.  . metoprolol tartrate (LOPRESSOR) 25 MG tablet TAKE 0.5 TABLETS BY MOUTH 2 TIMES DAILY.  . Multiple Vitamin (MULTIVITAMIN) tablet Take 1 tablet by mouth daily.  . nitroGLYCERIN (NITROSTAT) 0.4 MG SL tablet Place 1 tablet (0.4 mg total) under the tongue every 5 (five) minutes x 3 doses as needed. (Patient taking differently: Place 0.4 mg under the tongue every 5 (five) minutes x 3 doses as needed for chest pain. )  .  simvastatin (ZOCOR) 40 MG tablet TAKE 1 TABLET BY MOUTH EVERYDAY AT BEDTIME  . TRELEGY ELLIPTA 100-62.5-25 MCG/INH AEPB INHALE 1 INHALATION INTO THE LUNGS DAILY. RINSE MOUTH AFTER USE.     Allergies:   Patient has no known allergies.   Social History   Tobacco Use  . Smoking status: Former Smoker    Packs/day: 2.00    Years: 33.00    Pack years: 66.00    Types: Cigarettes    Start date: 10/27/1972    Quit date: 2007    Years since quitting: 14.1  . Smokeless tobacco: Never Used  Substance Use Topics  . Alcohol  use: No    Alcohol/week: 0.0 standard drinks  . Drug use: No     Family Hx: The patient's family history includes Cancer in her brother; Pneumonia in her brother and brother.  ROS:   Please see the history of present illness. All other systems reviewed and are negative.   Prior CV studies:   The following studies were reviewed today:  Lexiscan Myoview 12/15/2017:  The study is normal. No myocardial ischemia or scar.  Nuclear stress EF: 62%.  This is a low risk study.  Chest CT 09/27/2019: FINDINGS: Cardiovascular: The heart is normal in size. No pericardial effusion.  No evidence of thoracic aortic aneurysm. Atherosclerotic calcifications of the aortic arch.  Three vessel coronary atherosclerosis.  Mediastinum/Nodes: Small mediastinal lymph nodes which do not meet pathologic CT size criteria.  Visualized thyroid is unremarkable.  Lungs/Pleura: Mild biapical pleural-parenchymal scarring.  Mild centrilobular and paraseptal emphysematous changes, upper lung predominant.  Bronchiectasis with bronchial wall thickening in the right middle lobe, lingula, and bilateral lower lobes.  Linear scarring/atelectasis in the medial left upper lobe/lingula and inferior right middle lobe. No focal consolidation.  Scattered small bilateral pulmonary nodules, including a dominant 6.3 mm nodule in the left upper lobe, previously 5.9 mm, unchanged.  No pleural effusion or pneumothorax.  Upper Abdomen: Visualized upper abdomen is notable for vascular calcifications.  Musculoskeletal: Visualized osseous structures are within normal limits.  IMPRESSION: Lung-RADS 2, benign appearance or behavior. Continue annual screening with low-dose chest CT without contrast in 12 months.  Aortic Atherosclerosis (ICD10-I70.0) and Emphysema (ICD10-J43.9).  Labs/Other Tests and Data Reviewed:    EKG:  An ECG dated 06/21/2018 was personally reviewed today and demonstrated:  Sinus  rhythm with nonspecific ST changes.  Recent Labs: 05/18/2019: ALT 14; BUN 13; Creat 0.81; Potassium 4.4; Sodium 141; TSH 0.82   Recent Lipid Panel Lab Results  Component Value Date/Time   CHOL 132 05/18/2019 07:22 AM   CHOL 155 01/08/2016 08:09 AM   TRIG 120 05/18/2019 07:22 AM   HDL 51 05/18/2019 07:22 AM   HDL 53 01/08/2016 08:09 AM   CHOLHDL 2.6 05/18/2019 07:22 AM   LDLCALC 60 05/18/2019 07:22 AM    Wt Readings from Last 3 Encounters:  10/27/19 132 lb (59.9 kg)  06/20/19 130 lb (59 kg)  02/16/19 155 lb (70.3 kg)     Objective:    Vital Signs:  BP (!) 121/59   Pulse 73   Ht 5\' 1"  (1.549 m)   Wt 132 lb (59.9 kg)   BMI 24.94 kg/m    Patient spoke in full sentences, not short of breath. Audible wheezing or coughing. Speech pattern normal.  ASSESSMENT & PLAN:    1.  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  She continues to do very well, no active angina symptoms or nitroglycerin use.  Follow-up  Myoview from 2019 was low risk.  Continue aspirin, Lopressor, and Zocor.  2.  Mixed hyperlipidemia, tolerating Zocor.  Last LDL 60.  3.  Essential hypertension, blood pressure well controlled today.  No changes made to present regimen.  Keep follow-up with Dr. Wolfgang Phoenix.  4.  History of pulmonary nodules, stable by follow-up chest CT.  She continues to follow with Pulmonary.  I reviewed the study report from this January.   Time:   Today, I have spent 5 minutes with the patient with telehealth technology discussing the above problems.     Medication Adjustments/Labs and Tests Ordered: Current medicines are reviewed at length with the patient today.  Concerns regarding medicines are outlined above.   Tests Ordered: No orders of the defined types were placed in this encounter.   Medication Changes: No orders of the defined types were placed in this encounter.   Follow Up:  In Person 1 year in the [place] office.  Signed, Rozann Lesches, MD  10/27/2019 8:51 AM    [place]

## 2019-10-26 NOTE — Telephone Encounter (Signed)
Virtual Visit Pre-Appointment Phone Call  "(Name), I am calling you today to discuss your upcoming appointment. We are currently trying to limit exposure to the virus that causes COVID-19 by seeing patients at home rather than in the office."  1. "What is the BEST phone number to call the day of the visit?" - include this in appointment notes  2. Do you have or have access to (through a family member/friend) a smartphone with video capability that we can use for your visit?" a. If yes - list this number in appt notes as cell (if different from BEST phone #) and list the appointment type as a VIDEO visit in appointment notes b. If no - list the appointment type as a PHONE visit in appointment notes  3. Confirm consent - "In the setting of the current Covid19 crisis, you are scheduled for a (phone or video) visit with your provider on (date) at (time).  Just as we do with many in-office visits, in order for you to participate in this visit, we must obtain consent.  If you'd like, I can send this to your mychart (if signed up) or email for you to review.  Otherwise, I can obtain your verbal consent now.  All virtual visits are billed to your insurance company just like a normal visit would be.  By agreeing to a virtual visit, we'd like you to understand that the technology does not allow for your provider to perform an examination, and thus may limit your provider's ability to fully assess your condition. If your provider identifies any concerns that need to be evaluated in person, we will make arrangements to do so.  Finally, though the technology is pretty good, we cannot assure that it will always work on either your or our end, and in the setting of a video visit, we may have to convert it to a phone-only visit.  In either situation, we cannot ensure that we have a secure connection.  Are you willing to proceed?" STAFF: Did the patient verbally acknowledge consent to telehealth visit? Document  YES/NO here: YES  4. Advise patient to be prepared - "Two hours prior to your appointment, go ahead and check your blood pressure, pulse, oxygen saturation, and your weight (if you have the equipment to check those) and write them all down. When your visit starts, your provider will ask you for this information. If you have an Apple Watch or Kardia device, please plan to have heart rate information ready on the day of your appointment. Please have a pen and paper handy nearby the day of the visit as well."  5. Give patient instructions for MyChart download to smartphone OR Doximity/[email or web address] as below if video visit (depending on what platform provider is using)  6. Inform patient they will receive a phone call 15 minutes prior to their appointment time (may be from unknown caller ID) so they should be prepared to answer    TELEPHONE CALL NOTE  [name] has been deemed a candidate for a follow-up tele-health visit to limit community exposure during the Covid-19 pandemic. I spoke with the patient via phone to ensure availability of phone/video source, confirm preferred email & phone number, and discuss instructions and expectations.  I reminded [name] to be prepared with any vital sign and/or heart rhythm information that could potentially be obtained via home monitoring, at the time of her visit. I reminded [name] to expect a phone call prior to  her visit.  [name] 10/26/2019 1:42 PM   INSTRUCTIONS FOR DOWNLOADING THE MYCHART APP TO SMARTPHONE  - The patient must first make sure to have activated MyChart and know their login information - If Apple, go to [employer] and type in MyChart in the search bar and download the app. If Android, ask patient to go to [employer] and type in [place] in the search bar and download the app. The app is free but as with any other app downloads, their phone may require them to verify saved payment information or  Apple/Android password.  - The patient will need to then log into the app with their MyChart username and password, and select [facility] as their healthcare provider to link the account. When it is time for your visit, go to the MyChart app, find appointments, and click Begin Video Visit. Be sure to Select Allow for your device to access the Microphone and Camera for your visit. You will then be connected, and your provider will be with you shortly.  **If they have any issues connecting, or need assistance please contact MyChart service desk [phone])**  **If using a computer, in order to ensure the best quality for their visit they will need to use either of the following Internet Browsers: [employer], or Google Chrome**  IF USING DOXIMITY or DOXY.ME - The patient will receive a link just prior to their visit by text.     FULL LENGTH CONSENT FOR TELE-HEALTH VISIT   I hereby voluntarily request, consent and authorize [facility] and its employed or contracted physicians, physician assistants, nurse practitioners or other licensed health care professionals (the Practitioner), to provide me with telemedicine health care services (the Services") as deemed necessary by the treating Practitioner. I acknowledge and consent to receive the Services by the Practitioner via telemedicine. I understand that the telemedicine visit will involve communicating with the Practitioner through live audiovisual communication technology and the disclosure of certain medical information by electronic transmission. I acknowledge that I have been given the opportunity to request an in-person assessment or other available alternative prior to the telemedicine visit and am voluntarily participating in the telemedicine visit.  I understand that I have the right to withhold or withdraw my consent to the use of telemedicine in the course of my care at any time, without affecting my right to future care  or treatment, and that the Practitioner or I may terminate the telemedicine visit at any time. I understand that I have the right to inspect all information obtained and/or recorded in the course of the telemedicine visit and may receive copies of available information for a reasonable fee.  I understand that some of the potential risks of receiving the Services via telemedicine include:   Delay or interruption in medical evaluation due to technological equipment failure or disruption;  Information transmitted may not be sufficient (e.g. poor resolution of images) to allow for appropriate medical decision making by the Practitioner; and/or   In rare instances, security protocols could fail, causing a breach of personal health information.  Furthermore, I acknowledge that it is my responsibility to provide information about my medical history, conditions and care that is complete and accurate to the best of my ability. I acknowledge that Practitioner's advice, recommendations, and/or decision may be based on factors not within their control, such as incomplete or inaccurate data provided by me or distortions of diagnostic images or specimens that may result from electronic transmissions. I  understand that the practice of medicine is not an exact science and that Practitioner makes no warranties or guarantees regarding treatment outcomes. I acknowledge that I will receive a copy of this consent concurrently upon execution via email to the email address I last provided but may also request a printed copy by calling the office of [facility].    I understand that my insurance will be billed for this visit.   I have read or had this consent read to me.  I understand the contents of this consent, which adequately explains the benefits and risks of the Services being provided via telemedicine.   I have been provided ample opportunity to ask questions regarding this consent and the Services and have had  my questions answered to my satisfaction.  I give my informed consent for the services to be provided through the use of telemedicine in my medical care  By participating in this telemedicine visit I agree to the above.

## 2019-10-27 ENCOUNTER — Telehealth (INDEPENDENT_AMBULATORY_CARE_PROVIDER_SITE_OTHER): Payer: Medicare HMO | Admitting: Cardiology

## 2019-10-27 ENCOUNTER — Encounter: Payer: Self-pay | Admitting: Cardiology

## 2019-10-27 VITALS — BP 121/59 | HR 73 | Ht 61.0 in | Wt 132.0 lb

## 2019-10-27 DIAGNOSIS — R69 Illness, unspecified: Secondary | ICD-10-CM | POA: Diagnosis not present

## 2019-10-27 DIAGNOSIS — E782 Mixed hyperlipidemia: Secondary | ICD-10-CM

## 2019-10-27 DIAGNOSIS — I25119 Atherosclerotic heart disease of native coronary artery with unspecified angina pectoris: Secondary | ICD-10-CM

## 2019-10-27 DIAGNOSIS — R911 Solitary pulmonary nodule: Secondary | ICD-10-CM | POA: Diagnosis not present

## 2019-10-27 DIAGNOSIS — I10 Essential (primary) hypertension: Secondary | ICD-10-CM | POA: Diagnosis not present

## 2019-10-27 NOTE — Patient Instructions (Addendum)

## 2019-11-17 DIAGNOSIS — R69 Illness, unspecified: Secondary | ICD-10-CM | POA: Diagnosis not present

## 2019-11-21 IMAGING — CT CT CERVICAL SPINE W/O CM
4 of 7 series · 16 of 33 positions shown, 17 images · non-contrast
Comparison: CT head 02/12/2010

CLINICAL DATA: Numbness and tingling in LEFT arm for 1 week, no
injury, at portion of skull removed years ago for an abnormal growth

EXAM:
CT HEAD WITHOUT CONTRAST
CT CERVICAL SPINE WITHOUT CONTRAST
TECHNIQUE: Multidetector CT imaging of the head and cervical spine was
performed following the standard protocol without intravenous
contrast. Multiplanar CT image reconstructions of the cervical spine
were also generated.

[Series 4: coronal soft tissue · coronal · 0.32mm/px · 3 of 71 slices shown]
[im 18/71  bone]
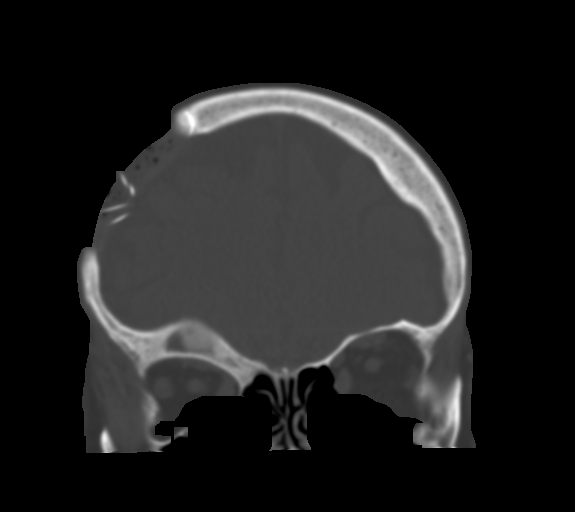
[im 36/71  bone]
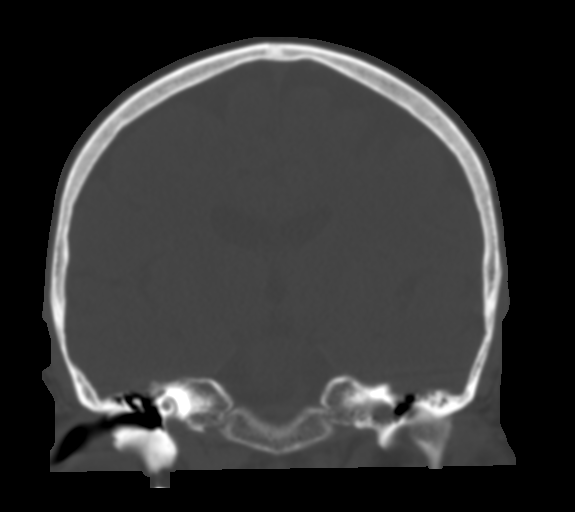
[im 53/71  bone]
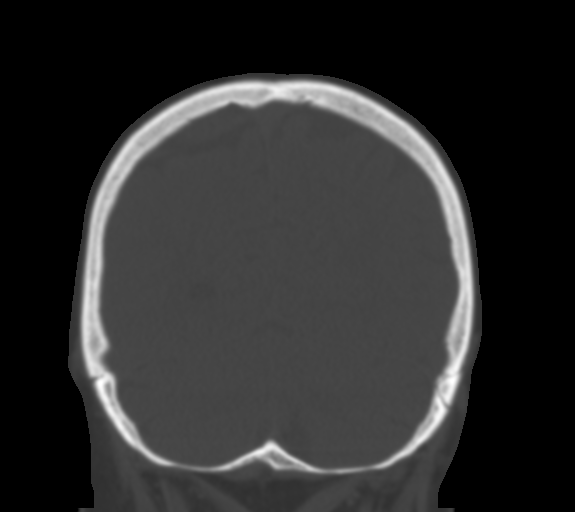

[Series 7: c spine soft · axial · 0.31mm/px · z∈[-124,-34]mm · 4 of 77 slices shown]
[im 16/77  soft-tissue]
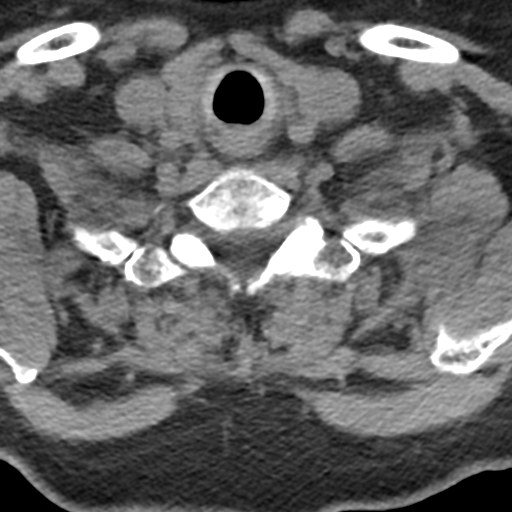
[im 31/77  soft-tissue]
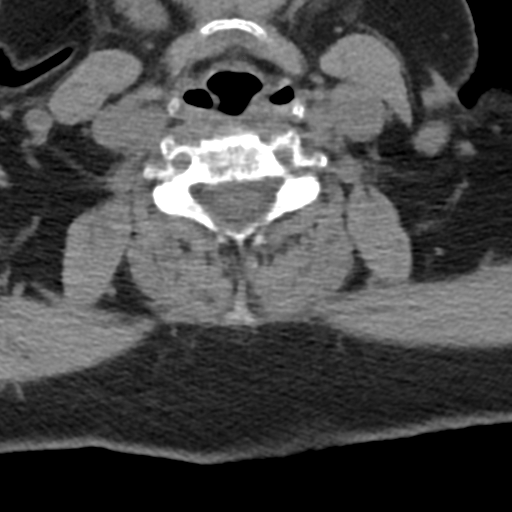
[im 46/77  soft-tissue]
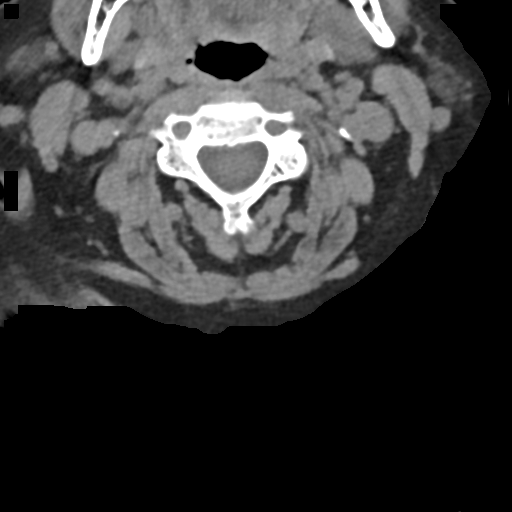
[im 61/77  soft-tissue]
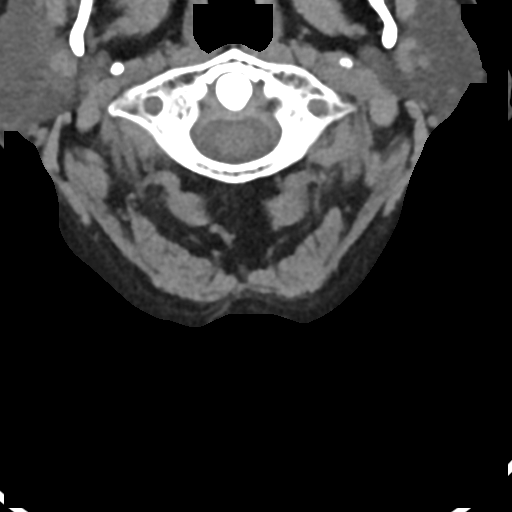

[Series 8: sagittal bone · sagittal · 0.32mm/px · 5 of 61 slices shown]
[im 11/61  bone]
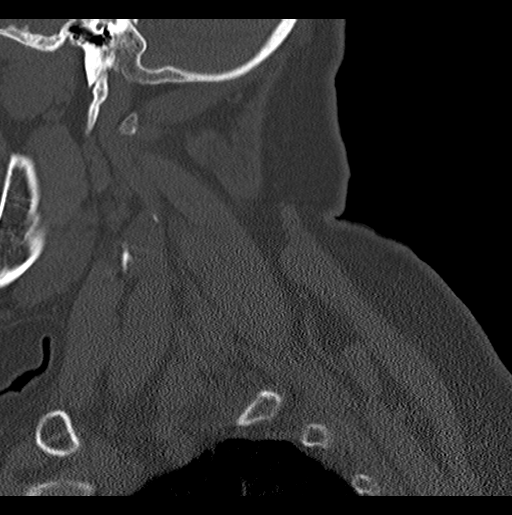
[im 21/61  bone]
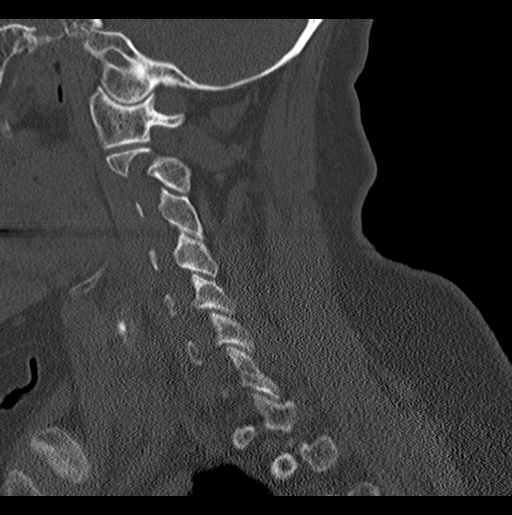
[im 31/61  bone]
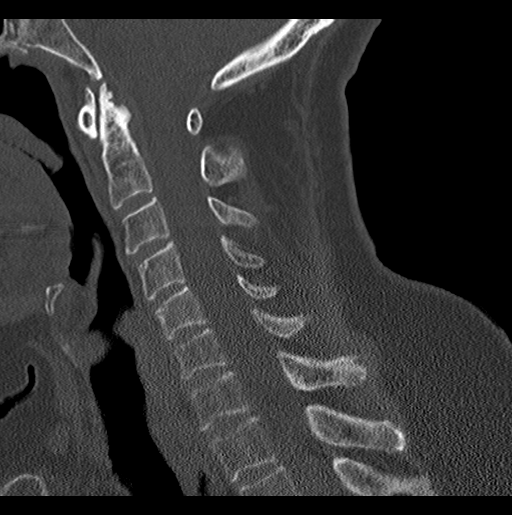
[im 41/61  bone]
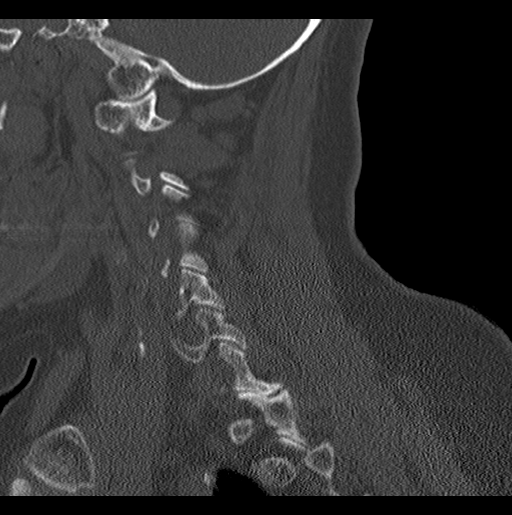
[im 51/61  bone]
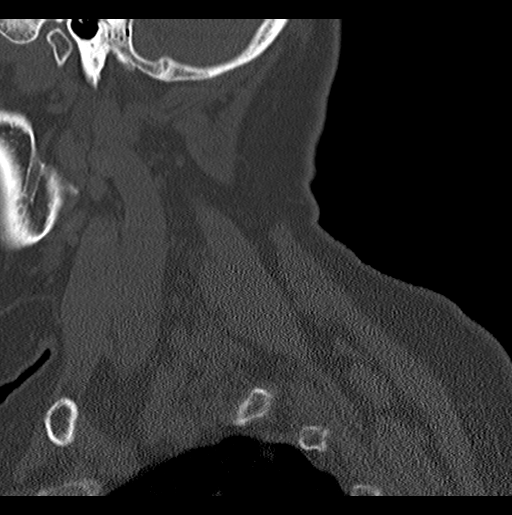

[Series 13: orthogonal bone · axial · 0.21mm/px · z∈[-142,-55]mm · 4 of 81 slices shown, 5 images]
[im 17/81  soft-tissue]
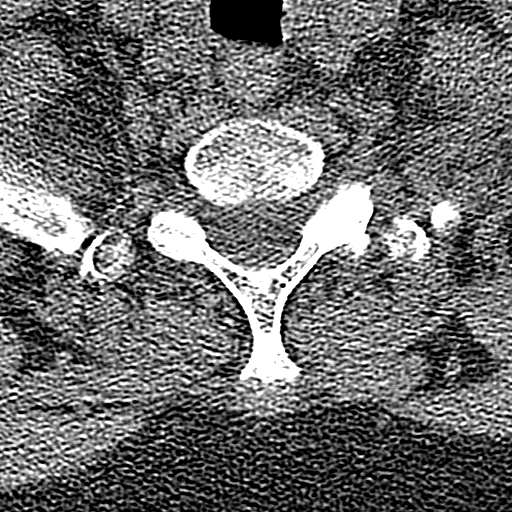
[im 17/81  bone]
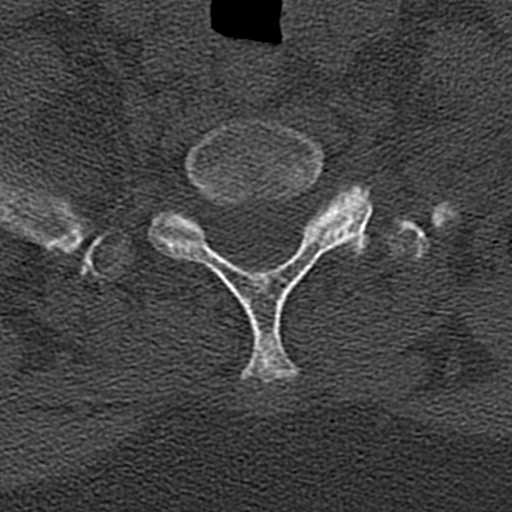
[im 33/81  bone]
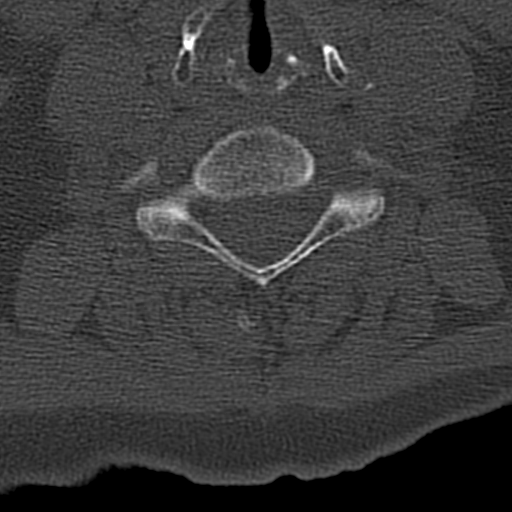
[im 49/81  bone]
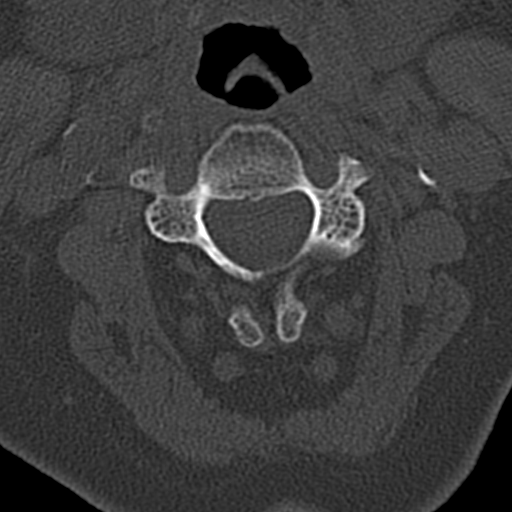
[im 65/81  bone]
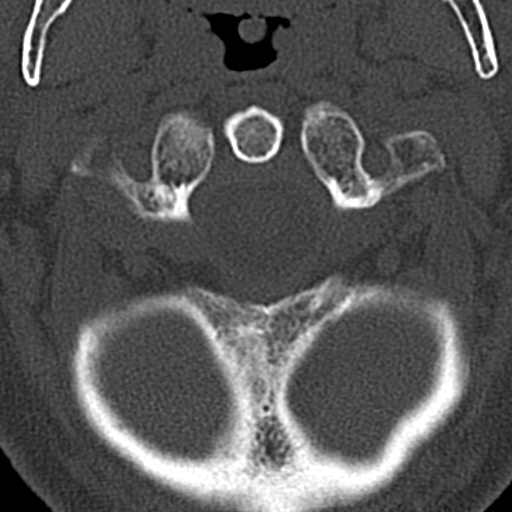

[16 of 33 positions shown; findings below may reference images not displayed]

FINDINGS: CT HEAD FINDINGS

Brain: Mild atrophy. Normal ventricular morphology. No midline shift
or mass effect. Normal appearance of brain parenchyma. No
intracranial hemorrhage, mass lesion or evidence acute infarction.
No extra-axial fluid collections.

Vascular: No hyperdense vessels.

Skull: Prior RIGHT frontal calvarial resection with cranioplasty,
appearance stable. Associated frontal calvarial thickening. No new
bone lesions.

Sinuses/Orbits: Mucosal retention cyst RIGHT maxillary sinus.
Chronic loss of RIGHT frontal sinus. Remaining visualized paranasal
sinuses and LEFT mastoid air cells. Partial opacification of RIGHT
mastoid air cells, chronic.

Other: N/A

CT CERVICAL SPINE FINDINGS

Alignment: Normal

Skull base and vertebrae: Mild osseous demineralization. Minimal
anterior height loss at C5 appears old. Vertebral body and disc
space heights otherwise maintained. No fracture, subluxation or bone
destruction. Mild facet degenerative changes of the cervical and
upper thoracic spine. Visualized skull base intact.

Soft tissues and spinal canal: Prevertebral soft tissues normal
thickness. Atherosclerotic calcifications of the carotid
bifurcations.

Disc levels: No additional abnormalities. No obvious disc
herniation.

Upper chest: Lung apices clear.

Other: N/A
IMPRESSION: Prior RIGHT frontal calvarial resection and cranioplasty.

No acute intracranial abnormalities.

Mild degenerative facet disease changes of the cervical spine.

No acute cervical spine abnormalities.

## 2019-11-22 ENCOUNTER — Other Ambulatory Visit: Payer: Self-pay | Admitting: Family Medicine

## 2019-12-08 ENCOUNTER — Other Ambulatory Visit: Payer: Self-pay | Admitting: Family Medicine

## 2019-12-08 ENCOUNTER — Telehealth: Payer: Self-pay | Admitting: Family Medicine

## 2019-12-08 NOTE — Telephone Encounter (Signed)
Scheduled 5/24

## 2019-12-08 NOTE — Telephone Encounter (Signed)
Patient needs to renew her handicap placard.  Form placed in [employer]

## 2019-12-08 NOTE — Telephone Encounter (Signed)
90-day does need to do a follow-up visit by later in the spring early summer

## 2019-12-09 NOTE — Telephone Encounter (Signed)
Nurses please print off a view placard request and forward back to me I have not found that in my green folder at this point thank you

## 2019-12-09 NOTE — Telephone Encounter (Signed)
Form in dr scott's folder 

## 2019-12-11 NOTE — Telephone Encounter (Signed)
Form was filled out as requested patient to pick up she will need to make sure she feels in her apartment signs and thank you

## 2020-02-13 ENCOUNTER — Ambulatory Visit (INDEPENDENT_AMBULATORY_CARE_PROVIDER_SITE_OTHER): Payer: Medicare HMO | Admitting: Family Medicine

## 2020-02-13 ENCOUNTER — Other Ambulatory Visit: Payer: Self-pay

## 2020-02-13 ENCOUNTER — Other Ambulatory Visit: Payer: Self-pay | Admitting: Family Medicine

## 2020-02-13 ENCOUNTER — Encounter: Payer: Self-pay | Admitting: Family Medicine

## 2020-02-13 VITALS — BP 140/78 | Temp 95.0°F | Wt 145.6 lb

## 2020-02-13 DIAGNOSIS — E782 Mixed hyperlipidemia: Secondary | ICD-10-CM

## 2020-02-13 DIAGNOSIS — J449 Chronic obstructive pulmonary disease, unspecified: Secondary | ICD-10-CM

## 2020-02-13 DIAGNOSIS — E038 Other specified hypothyroidism: Secondary | ICD-10-CM

## 2020-02-13 DIAGNOSIS — R030 Elevated blood-pressure reading, without diagnosis of hypertension: Secondary | ICD-10-CM

## 2020-02-13 DIAGNOSIS — I2510 Atherosclerotic heart disease of native coronary artery without angina pectoris: Secondary | ICD-10-CM | POA: Diagnosis not present

## 2020-02-13 DIAGNOSIS — D692 Other nonthrombocytopenic purpura: Secondary | ICD-10-CM | POA: Diagnosis not present

## 2020-02-13 MED ORDER — METOPROLOL TARTRATE 25 MG PO TABS: 12.5000 mg | ORAL_TABLET | Freq: Two times a day (BID) | ORAL | 1 refills | Status: DC

## 2020-02-13 MED ORDER — LEVOTHYROXINE SODIUM 125 MCG PO TABS: ORAL_TABLET | ORAL | 1 refills | Status: DC

## 2020-02-13 MED ORDER — SIMVASTATIN 40 MG PO TABS: ORAL_TABLET | ORAL | 1 refills | Status: DC

## 2020-02-13 MED ORDER — TRELEGY ELLIPTA 100-62.5-25 MCG/INH IN AEPB: INHALATION_SPRAY | RESPIRATORY_TRACT | 5 refills | Status: DC

## 2020-02-13 MED ORDER — PANTOPRAZOLE SODIUM 40 MG PO TBEC: 40.0000 mg | DELAYED_RELEASE_TABLET | Freq: Every day | ORAL | 1 refills | Status: DC

## 2020-02-13 NOTE — Progress Notes (Signed)
Subjective:    Patient ID: [name], female    DOB: 1961-09-09, 59 y.o.   MRN: [id number]  HPI Pt here today for follow up. Pt states she has had no issues with blood pressure. Pt states that she had one issue with her breathing during the winter but that is because she did not have Trelegy. Pt was having trouble with insurance regarding Trelegy but has since been able to get med and is doing well. Pt states she does check BP at home.  Other specified hypothyroidism - Plan: Lipid Profile, Hepatic function panel, Basic Metabolic Panel (BMET), TSH, CBC with Differential  Mixed hyperlipidemia - Plan: Lipid Profile, Hepatic function panel, Basic Metabolic Panel (BMET), TSH, CBC with Differential  Senile purpura ([place]) - Plan: Lipid Profile, Hepatic function panel, Basic Metabolic Panel (BMET), TSH, CBC with Differential  COPD (chronic obstructive pulmonary disease) with chronic bronchitis ([place]) - Plan: Lipid Profile, Hepatic function panel, Basic Metabolic Panel (BMET), TSH, CBC with Differential  Elevated blood pressure, situational - Plan: Lipid Profile, Hepatic function panel, Basic Metabolic Panel (BMET), TSH, CBC with Differential  Atherosclerosis of native coronary artery of native heart without angina pectoris - Plan: Lipid Profile, Hepatic function panel, Basic Metabolic Panel (BMET), TSH, CBC with Differential  Patient having difficult time affording the medications.  But when she is not in the donut hole she is able to afford it she states her breathing is doing okay She does relate the Protonix is necessary without taking it she has a lot of reflux issues and burning esophageal issues She does take her thyroid medicine states energy level does well Moods are doing well No falls or injuries Takes her cholesterol medicine watches her diet to some degree   Review of Systems  Constitutional: Negative for activity change, fatigue and fever.  HENT: Negative for congestion and  rhinorrhea.   Respiratory: Negative for cough, chest tightness and shortness of breath.   Cardiovascular: Negative for chest pain and leg swelling.  Gastrointestinal: Negative for abdominal pain and nausea.  Skin: Negative for color change.  Neurological: Negative for dizziness and headaches.  Psychiatric/Behavioral: Negative for agitation and behavioral problems.       Objective:   Physical Exam Vitals reviewed.  Constitutional:      General: She is not in acute distress. HENT:     Head: Normocephalic and atraumatic.  Eyes:     General:        Right eye: No discharge.        Left eye: No discharge.  Neck:     Trachea: No tracheal deviation.  Cardiovascular:     Rate and Rhythm: Normal rate and regular rhythm.     Heart sounds: Normal heart sounds. No murmur.  Pulmonary:     Effort: Pulmonary effort is normal. No respiratory distress.     Breath sounds: Normal breath sounds.  Lymphadenopathy:     Cervical: No cervical adenopathy.  Skin:    General: Skin is warm and dry.  Neurological:     Mental Status: She is alert.     Coordination: Coordination normal.  Psychiatric:        Behavior: Behavior normal.           Assessment & Plan:  1. Other specified hypothyroidism Thyroid treatment under decent control she needs to check up-to-date TSH continue medication - Lipid Profile - Hepatic function panel - Basic Metabolic Panel (BMET) - TSH - CBC with Differential  2. Mixed hyperlipidemia Keep  LDL below 70 if possible needs to check lab work await the results - Lipid Profile - Hepatic function panel - Basic Metabolic Panel (BMET) - TSH - CBC with Differential  3. Senile purpura ([place]) She does have bruising on both arms consistent with senile purpura check CBC to look at platelets patient is on aspirin denies rectal bleeding Because of the amount of bruising on her arms we will check CBC - Lipid Profile - Hepatic function panel - Basic Metabolic Panel  (BMET) - TSH - CBC with Differential  4. COPD (chronic obstructive pulmonary disease) with chronic bronchitis ([facility]) Her COPD is stable she is staying away from smoking she does use her medications although the cost of the medicine this is somewhat problematic - Lipid Profile - Hepatic function panel - Basic Metabolic Panel (BMET) - TSH - CBC with Differential  5. Elevated blood pressure, situational Her blood pressure is elevated currently.  She will follow-up in 4 weeks on the morning she comes back she will not drink coffee and hopefully blood pressure be under better control  Patient was kept in the office I rechecked it twice it was still elevated therefore the patient will follow up in 4 weeks - Lipid Profile - Hepatic function panel - Basic Metabolic Panel (BMET) - TSH - CBC with Differential  6. Atherosclerosis of native coronary artery of native heart without angina pectoris Very important to keep LDL below 70 if possible - Lipid Profile - Hepatic function panel - Basic Metabolic Panel (BMET) - TSH - CBC with Differential

## 2020-02-23 ENCOUNTER — Telehealth: Payer: Self-pay | Admitting: Family Medicine

## 2020-02-23 ENCOUNTER — Other Ambulatory Visit: Payer: Self-pay | Admitting: *Deleted

## 2020-02-23 DIAGNOSIS — E782 Mixed hyperlipidemia: Secondary | ICD-10-CM

## 2020-02-23 DIAGNOSIS — Z79899 Other long term (current) drug therapy: Secondary | ICD-10-CM

## 2020-02-23 DIAGNOSIS — E038 Other specified hypothyroidism: Secondary | ICD-10-CM

## 2020-02-23 DIAGNOSIS — I10 Essential (primary) hypertension: Secondary | ICD-10-CM

## 2020-02-23 DIAGNOSIS — R5383 Other fatigue: Secondary | ICD-10-CM

## 2020-02-23 DIAGNOSIS — R030 Elevated blood-pressure reading, without diagnosis of hypertension: Secondary | ICD-10-CM

## 2020-02-23 NOTE — Telephone Encounter (Signed)
PT called there was lab work ordered and was sent to [facility] Pt needs to go to [facility] her insurance only pays for [facility].

## 2020-02-23 NOTE — Telephone Encounter (Signed)
Orders changed and mailed to patient.

## 2020-03-01 DIAGNOSIS — I10 Essential (primary) hypertension: Secondary | ICD-10-CM | POA: Diagnosis not present

## 2020-03-01 DIAGNOSIS — R5383 Other fatigue: Secondary | ICD-10-CM | POA: Diagnosis not present

## 2020-03-01 DIAGNOSIS — E782 Mixed hyperlipidemia: Secondary | ICD-10-CM | POA: Diagnosis not present

## 2020-03-01 DIAGNOSIS — Z79899 Other long term (current) drug therapy: Secondary | ICD-10-CM | POA: Diagnosis not present

## 2020-03-01 DIAGNOSIS — E038 Other specified hypothyroidism: Secondary | ICD-10-CM | POA: Diagnosis not present

## 2020-03-01 LAB — CBC WITH DIFFERENTIAL/PLATELET
Absolute Monocytes: 551 cells/uL (ref 200–950)
Basophils Absolute: 52 cells/uL (ref 0–200)
Basophils Relative: 0.9 %
Eosinophils Absolute: 151 cells/uL (ref 15–500)
Eosinophils Relative: 2.6 %
HCT: 43.8 % (ref 35.0–45.0)
Hemoglobin: 14.3 g/dL (ref 11.7–15.5)
Lymphs Abs: 2274 cells/uL (ref 850–3900)
MCH: 30.2 pg (ref 27.0–33.0)
MCHC: 32.6 g/dL (ref 32.0–36.0)
MCV: 92.6 fL (ref 80.0–100.0)
MPV: 9.6 fL (ref 7.5–12.5)
Monocytes Relative: 9.5 %
Neutro Abs: 2772 cells/uL (ref 1500–7800)
Neutrophils Relative %: 47.8 %
Platelets: 259 10*3/uL (ref 140–400)
RBC: 4.73 10*6/uL (ref 3.80–5.10)
RDW: 12.3 % (ref 11.0–15.0)
Total Lymphocyte: 39.2 %
WBC: 5.8 10*3/uL (ref 3.8–10.8)

## 2020-03-01 LAB — HEPATIC FUNCTION PANEL
AG Ratio: 2 (calc) (ref 1.0–2.5)
ALT: 14 U/L (ref 6–29)
AST: 17 U/L (ref 10–35)
Albumin: 4.3 g/dL (ref 3.6–5.1)
Alkaline phosphatase (APISO): 67 U/L (ref 37–153)
Bilirubin, Direct: 0.1 mg/dL (ref 0.0–0.2)
Globulin: 2.1 g/dL (calc) (ref 1.9–3.7)
Indirect Bilirubin: 0.4 mg/dL (calc) (ref 0.2–1.2)
Total Bilirubin: 0.5 mg/dL (ref 0.2–1.2)
Total Protein: 6.4 g/dL (ref 6.1–8.1)

## 2020-03-01 LAB — BASIC METABOLIC PANEL
BUN: 18 mg/dL (ref 7–25)
CO2: 29 mmol/L (ref 20–32)
Calcium: 9.2 mg/dL (ref 8.6–10.4)
Chloride: 104 mmol/L (ref 98–110)
Creat: 0.87 mg/dL (ref 0.50–0.99)
Glucose, Bld: 91 mg/dL (ref 65–99)
Potassium: 4.2 mmol/L (ref 3.5–5.3)
Sodium: 142 mmol/L (ref 135–146)

## 2020-03-01 LAB — LIPID PANEL
Cholesterol: 168 mg/dL (ref <200)
HDL: 60 mg/dL (ref >=50)
LDL Cholesterol (Calc): 83 mg/dL (calc)
Non-HDL Cholesterol (Calc): 108 mg/dL (calc) (ref <130)
Total CHOL/HDL Ratio: 2.8 (calc) (ref <5.0)
Triglycerides: 157 mg/dL — ABNORMAL HIGH (ref <150)

## 2020-03-01 LAB — TSH: TSH: 0.68 mIU/L (ref 0.40–4.50)

## 2020-03-12 ENCOUNTER — Ambulatory Visit (INDEPENDENT_AMBULATORY_CARE_PROVIDER_SITE_OTHER): Payer: Medicare HMO | Admitting: Family Medicine

## 2020-03-12 ENCOUNTER — Other Ambulatory Visit: Payer: Self-pay

## 2020-03-12 ENCOUNTER — Encounter: Payer: Self-pay | Admitting: Family Medicine

## 2020-03-12 VITALS — BP 130/80 | Temp 96.2°F | Wt 144.0 lb

## 2020-03-12 DIAGNOSIS — I10 Essential (primary) hypertension: Secondary | ICD-10-CM | POA: Diagnosis not present

## 2020-03-12 NOTE — Patient Instructions (Signed)
Results for orders placed or performed in visit on 28/00/34  Basic metabolic panel  Result Value Ref Range   Glucose, Bld 91 65 - 99 mg/dL   BUN 18 7 - 25 mg/dL   Creat 0.87 0.50 - 0.99 mg/dL   BUN/Creatinine Ratio NOT APPLICABLE 6 - 22 (calc)   Sodium 142 135 - 146 mmol/L   Potassium 4.2 3.5 - 5.3 mmol/L   Chloride 104 98 - 110 mmol/L   CO2 29 20 - 32 mmol/L   Calcium 9.2 8.6 - 10.4 mg/dL  CBC with Differential/Platelet  Result Value Ref Range   WBC 5.8 3.8 - 10.8 Thousand/uL   RBC 4.73 3.80 - 5.10 Million/uL   Hemoglobin 14.3 11.7 - 15.5 g/dL   HCT 43.8 35 - 45 %   MCV 92.6 80.0 - 100.0 fL   MCH 30.2 27.0 - 33.0 pg   MCHC 32.6 32.0 - 36.0 g/dL   RDW 12.3 11.0 - 15.0 %   Platelets 259 140 - 400 Thousand/uL   MPV 9.6 7.5 - 12.5 fL   Neutro Abs 2,772 1,500 - 7,800 cells/uL   Lymphs Abs 2,274 850 - 3,900 cells/uL   Absolute Monocytes 551 200 - 950 cells/uL   Eosinophils Absolute 151 15 - 500 cells/uL   Basophils Absolute 52 0 - 200 cells/uL   Neutrophils Relative % 47.8 %   Total Lymphocyte 39.2 %   Monocytes Relative 9.5 %   Eosinophils Relative 2.6 %   Basophils Relative 0.9 %  Hepatic function panel  Result Value Ref Range   Total Protein 6.4 6.1 - 8.1 g/dL   Albumin 4.3 3.6 - 5.1 g/dL   Globulin 2.1 1.9 - 3.7 g/dL (calc)   AG Ratio 2.0 1.0 - 2.5 (calc)   Total Bilirubin 0.5 0.2 - 1.2 mg/dL   Bilirubin, Direct 0.1 0.0 - 0.2 mg/dL   Indirect Bilirubin 0.4 0.2 - 1.2 mg/dL (calc)   Alkaline phosphatase (APISO) 67 37 - 153 U/L   AST 17 10 - 35 U/L   ALT 14 6 - 29 U/L  Lipid panel  Result Value Ref Range   Cholesterol 168 <200 mg/dL   HDL 60 > OR = 50 mg/dL   Triglycerides 157 (H) <150 mg/dL   LDL Cholesterol (Calc) 83 mg/dL (calc)   Total CHOL/HDL Ratio 2.8 <5.0 (calc)   Non-HDL Cholesterol (Calc) 108 <130 mg/dL (calc)  TSH  Result Value Ref Range   TSH 0.68 0.40 - 4.50 mIU/L

## 2020-03-12 NOTE — Progress Notes (Signed)
   Subjective:    Patient ID: [name], female    DOB: March 30, 1961, 59 y.o.   MRN: [id number]  HPI Pt here today for follow up. Pt blood pressure was elevated on 02/13/20 visit. Pt states she checked her blood pressure at 7 am this morning and it was 115/68. Pt has not had any headaches, no blurred vision, no signs of HTN.   The patient states that she purposely did not drink much in the way of coffee today she is try to do the best she can and eating healthy Review of Systems  Constitutional: Negative for activity change, fatigue and fever.  HENT: Negative for congestion and rhinorrhea.   Respiratory: Negative for cough, chest tightness and shortness of breath.   Cardiovascular: Negative for chest pain and leg swelling.  Gastrointestinal: Negative for abdominal pain and nausea.  Skin: Negative for color change.  Neurological: Negative for dizziness and headaches.  Psychiatric/Behavioral: Negative for agitation and behavioral problems.       Objective:   Physical Exam Vitals reviewed.  Constitutional:      General: She is not in acute distress. HENT:     Head: Normocephalic and atraumatic.  Eyes:     General:        Right eye: No discharge.        Left eye: No discharge.  Neck:     Trachea: No tracheal deviation.  Cardiovascular:     Rate and Rhythm: Normal rate and regular rhythm.     Heart sounds: Normal heart sounds. No murmur heard.   Pulmonary:     Effort: Pulmonary effort is normal. No respiratory distress.     Breath sounds: Normal breath sounds.  Lymphadenopathy:     Cervical: No cervical adenopathy.  Skin:    General: Skin is warm and dry.  Neurological:     Mental Status: She is alert.     Coordination: Coordination normal.  Psychiatric:        Behavior: Behavior normal.           Assessment & Plan:  Blood pressure on follow-up actually looks good.  Continue current measures.  Avoid excessive coffee  Recent lab work reviewed with the patient  overall looks good  Follow-up in October or November.  Flu vaccine at that time.

## 2020-03-21 ENCOUNTER — Telehealth: Payer: Self-pay | Admitting: Family Medicine

## 2020-03-21 NOTE — Telephone Encounter (Signed)
Patient is requesting a letter to excuse her from jury duty due to the fact she cant wear a mask all day. Please advise

## 2020-03-23 ENCOUNTER — Encounter: Payer: Self-pay | Admitting: Family Medicine

## 2020-03-23 NOTE — Telephone Encounter (Signed)
A letter to excuse her from jury duty was written

## 2020-03-27 NOTE — Telephone Encounter (Signed)
Pt notified about letter.

## 2020-05-23 ENCOUNTER — Telehealth: Payer: Self-pay | Admitting: *Deleted

## 2020-05-23 ENCOUNTER — Telehealth (INDEPENDENT_AMBULATORY_CARE_PROVIDER_SITE_OTHER): Payer: Medicare HMO | Admitting: Family Medicine

## 2020-05-23 ENCOUNTER — Other Ambulatory Visit: Payer: Self-pay

## 2020-05-23 DIAGNOSIS — J329 Chronic sinusitis, unspecified: Secondary | ICD-10-CM

## 2020-05-23 MED ORDER — AZITHROMYCIN 250 MG PO TABS: ORAL_TABLET | ORAL | 0 refills | Status: DC

## 2020-05-23 NOTE — Progress Notes (Signed)
Patient ID: [name], female    DOB: 1961/06/30, 59 y.o.   MRN: [id number]   Virtual Visit via Video Note  I connected with [name] on 05/23/20 at 11:00 AM EDT by a video enabled telemedicine application and verified that I am speaking with the correct person using two identifiers.  Location: Patient: home Provider: office   I discussed the limitations of evaluation and management by telemedicine and the availability of in person appointments. The patient expressed understanding and agreed to proceed.  Chief Complaint  Patient presents with  . Cough    congestion- got Covid test this am- had symptoms for a week and thinks she has a sinus infection   Subjective:    HPI Pt having stuffy head feeling.  Coughing for 1 wk.  Has h/o copd.  Feels that this comes on 1-2 x per year and when she mows the grass it aggravates the symptoms.  Was mowing last week and then started having this.  No fever, n/v/d, sore throat or ear pain. No facial pain, or pain in sinuses.  Has had sick contact- granddaughter was sick with covid last week and tested positive 1 wk ago.  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  Had vaccine for covid- moderna x2. Coughing up some sputum with yellowish.  Using inhaler- trelegy and albuterol.  And using neb machine. Using robitussin at night.    Medical History [name] has a past medical history of Coronary atherosclerosis of native coronary artery, Essential hypertension, Hypothyroidism, Mixed hyperlipidemia, and NSTEMI (non-ST elevated myocardial infarction) ([place]).   Outpatient Encounter Medications as of 05/23/2020  Medication Sig  . aspirin 81 MG EC tablet Take 81 mg by mouth at bedtime.   Marland Kitchen azithromycin (ZITHROMAX Z-PAK) 250 MG tablet Take 2 tab p.o. day 1, then take 1 tab daily for 4 more days.  . Cholecalciferol (VITAMIN D3) 400 units CAPS Take 400 Units by mouth at bedtime.   . Fluticasone-Umeclidin-Vilant (TRELEGY ELLIPTA) 100-62.5-25  MCG/INH AEPB INHALE 1 INHALATION INTO THE LUNGS DAILY. RINSE MOUTH AFTER USE.  Marland Kitchen levothyroxine (SYNTHROID) 125 MCG tablet 1 qam all days  Except Monday use 1/2 in the am  . loratadine (CLARITIN) 10 MG tablet Take 10 mg by mouth daily.  . metoprolol tartrate (LOPRESSOR) 25 MG tablet Take 0.5 tablets (12.5 mg total) by mouth 2 (two) times daily.  . Multiple Vitamin (MULTIVITAMIN) tablet Take 1 tablet by mouth daily.  . nitroGLYCERIN (NITROSTAT) 0.4 MG SL tablet Place 1 tablet (0.4 mg total) under the tongue every 5 (five) minutes x 3 doses as needed. (Patient taking differently: Place 0.4 mg under the tongue every 5 (five) minutes x 3 doses as needed for chest pain. )  . pantoprazole (PROTONIX) 40 MG tablet Take 1 tablet (40 mg total) by mouth daily.  . simvastatin (ZOCOR) 40 MG tablet TAKE 1 TABLET BY MOUTH EVERYDAY AT BEDTIME   No facility-administered encounter medications on file as of 05/23/2020.     Review of Systems  Constitutional: Negative for chills and fever.  HENT: Positive for congestion. Negative for ear pain, postnasal drip, rhinorrhea, sinus pressure, sinus pain and sore throat.   Eyes: Negative for pain, discharge and itching.  Respiratory: Positive for cough. Negative for chest tightness, shortness of breath and wheezing.   Gastrointestinal: Negative for constipation, diarrhea, nausea and vomiting.     Vitals There were no vitals taken for this visit.  Objective:   Physical Exam  No PE due  to phone visit.  Assessment and Plan   1. Sinusitis, unspecified chronicity, unspecified location - azithromycin (ZITHROMAX Z-PAK) 250 MG tablet; Take 2 tab p.o. day 1, then take 1 tab daily for 4 more days.  Dispense: 6 each; Refill: 0    Advising symptomatic tx for viral illness. Reviewed usual course of viral illness vs. Sinusitis and antibiotic use.  Gave script for azithromycin to take, but reviewed that if it's viral illness it might not get better with abx and may still run  course of 7-10 days.  --Tylenol, cough syrup otc and ibuprofen prn. increase fluids and call if not improving in next 2-3 days.   Pt got covid testing today and pending results.  Advised to call back with results if positive for guidance. mucinex bid with increase in fluids.  flonase for congestion. Tylenol/ibuprofen prn for bodyaches/fever. Continue using inhalers as directed.  If worsening coughing, sob, or fever to call or rto.  Pt in agreement.   F/u prn.    Follow Up Instructions:    I discussed the assessment and treatment plan with the patient. The patient was provided an opportunity to ask questions and all were answered. The patient agreed with the plan and demonstrated an understanding of the instructions.   The patient was advised to call back or seek an in-person evaluation if the symptoms worsen or if the condition fails to improve as anticipated.  I provided 12 minutes of non-face-to-face time during this encounter.

## 2020-05-23 NOTE — Telephone Encounter (Signed)
[name], [name] are scheduled for a virtual visit with your provider today.    Just as we do with appointments in the office, we must obtain your consent to participate.  Your consent will be active for this visit and any virtual visit you may have with one of our providers in the next 365 days.    If you have a MyChart account, I can also send a copy of this consent to you electronically.  All virtual visits are billed to your insurance company just like a traditional visit in the office.  As this is a virtual visit, video technology does not allow for your provider to perform a traditional examination.  This may limit your provider's ability to fully assess your condition.  If your provider identifies any concerns that need to be evaluated in person or the need to arrange testing such as labs, EKG, etc, we will make arrangements to do so.    Although advances in technology are sophisticated, we cannot ensure that it will always work on either your end or our end.  If the connection with a video visit is poor, we may have to switch to a telephone visit.  With either a video or telephone visit, we are not always able to ensure that we have a secure connection.   I need to obtain your verbal consent now.   Are you willing to proceed with your visit today?   [name] has provided verbal consent on 05/23/2020 for a virtual visit (video or telephone).   Mitzie Na, RN 05/23/2020  10:37 AM

## 2020-05-29 ENCOUNTER — Telehealth: Payer: Self-pay | Admitting: Family Medicine

## 2020-05-29 MED ORDER — CEFPROZIL 500 MG PO TABS: ORAL_TABLET | ORAL | 0 refills | Status: DC

## 2020-05-29 NOTE — Telephone Encounter (Signed)
Nasal saline spray Cefzil 500 mg twice daily for 7 days Monitor herself closely if significant shortness of breath wheezing or difficulty breathing to notify us or get help/ER urgent care

## 2020-05-29 NOTE — Telephone Encounter (Signed)
Pt contacted and verbalized understanding. Medication sent to pharmacy  

## 2020-05-29 NOTE — Telephone Encounter (Signed)
Pt was seen Friday tested positive for Covid and was prescribed azithromycin (ZITHROMAX Z-PAK) 250 MG tablet Pt has finished this meds. Wanting to know if something else can be called in or what over the counter meds does she need to take. Pt uses [id number] - [place], [place] [address]   Pt call back [phone]

## 2020-05-29 NOTE — Telephone Encounter (Signed)
Started on aug 17th started having sinus. Tried mucinex. Had visit with dr Lovena Le. Having a croupy cough and sinus infection. Finished zpack yesterday. Went last Friday for covid test and was notified 2 days ago it was positive. Having a stuffy nose and cough. No sob, no fever. Using inhaler to stay ahead of this she states. Feeling better. States she does not feel sick. But wants something because she is coughing up thick yellow mucus.  [facility] [place].

## 2020-06-20 ENCOUNTER — Telehealth: Payer: Self-pay | Admitting: *Deleted

## 2020-06-20 DIAGNOSIS — R69 Illness, unspecified: Secondary | ICD-10-CM | POA: Diagnosis not present

## 2020-06-20 MED ORDER — ALBUTEROL SULFATE (2.5 MG/3ML) 0.083% IN NEBU
2.5000 mg | INHALATION_SOLUTION | RESPIRATORY_TRACT | 5 refills | Status: DC | PRN
Start: 2020-06-20 — End: 2021-09-21

## 2020-06-20 NOTE — Telephone Encounter (Signed)
Pt last seen 03/12/20 for med check up. Pt would like a refill on albuterol neb solution for her neb machine. States she is doing much better since having covid and never really got too sick with it.  [facility] [place].

## 2020-06-20 NOTE — Telephone Encounter (Signed)
Prescription sent electronically to pharmacy. Patient notified. 

## 2020-06-20 NOTE — Telephone Encounter (Signed)
Albuterol neb solution, 2.5 mL vials 1 box Use every 4 hours as needed 6 refills

## 2020-07-23 ENCOUNTER — Ambulatory Visit (INDEPENDENT_AMBULATORY_CARE_PROVIDER_SITE_OTHER): Payer: Medicare HMO | Admitting: Family Medicine

## 2020-07-23 ENCOUNTER — Ambulatory Visit: Payer: Medicare HMO | Admitting: Family Medicine

## 2020-07-23 ENCOUNTER — Other Ambulatory Visit: Payer: Self-pay

## 2020-07-23 VITALS — BP 126/82 | HR 78 | Temp 97.3°F | Ht 61.0 in | Wt 145.2 lb

## 2020-07-23 DIAGNOSIS — J449 Chronic obstructive pulmonary disease, unspecified: Secondary | ICD-10-CM | POA: Diagnosis not present

## 2020-07-23 DIAGNOSIS — E782 Mixed hyperlipidemia: Secondary | ICD-10-CM

## 2020-07-23 DIAGNOSIS — E038 Other specified hypothyroidism: Secondary | ICD-10-CM | POA: Diagnosis not present

## 2020-07-23 DIAGNOSIS — J441 Chronic obstructive pulmonary disease with (acute) exacerbation: Secondary | ICD-10-CM

## 2020-07-23 DIAGNOSIS — I10 Essential (primary) hypertension: Secondary | ICD-10-CM

## 2020-07-23 DIAGNOSIS — Z23 Encounter for immunization: Secondary | ICD-10-CM | POA: Diagnosis not present

## 2020-07-23 MED ORDER — PANTOPRAZOLE SODIUM 40 MG PO TBEC: 40.0000 mg | DELAYED_RELEASE_TABLET | Freq: Every day | ORAL | 1 refills | Status: DC

## 2020-07-23 MED ORDER — SIMVASTATIN 40 MG PO TABS: ORAL_TABLET | ORAL | 1 refills | Status: DC

## 2020-07-23 MED ORDER — LEVOTHYROXINE SODIUM 125 MCG PO TABS: ORAL_TABLET | ORAL | 1 refills | Status: DC

## 2020-07-23 MED ORDER — AMOXICILLIN-POT CLAVULANATE 875-125 MG PO TABS
1.0000 | ORAL_TABLET | Freq: Two times a day (BID) | ORAL | 0 refills | Status: DC
Start: 2020-07-23 — End: 2020-11-27

## 2020-07-23 MED ORDER — METOPROLOL TARTRATE 25 MG PO TABS: 12.5000 mg | ORAL_TABLET | Freq: Two times a day (BID) | ORAL | 1 refills | Status: DC

## 2020-07-23 NOTE — Progress Notes (Signed)
   Subjective:    Patient ID: [name], female    DOB: 02/17/1961, 59 y.o.   MRN: [id number]  Hypertension This is a chronic problem. The current episode started more than 1 year ago. Pertinent negatives include no chest pain or shortness of breath. Risk factors for coronary artery disease include dyslipidemia and post-menopausal state. Treatments tried: lopressor. There are no compliance problems.    Cough continues for over a month since having Covid first part of September Essential hypertension, benign  COPD (chronic obstructive pulmonary disease) with chronic bronchitis ([place])  Other specified hypothyroidism  Mixed hyperlipidemia  Need for vaccination - Plan: Flu Vaccine QUAD 36+ mos IM  Previous labs were reviewed Does take her cholesterol medicine regular basis Tries watch her diet Has COPD does not smoke anymore Does yearly preventative screening test for cancer Cannot afford her medicines on some months and tries to get by with substitutes Has underlying thyroid issues takes her medicine regular basis states energy level doing all right Takes careful overall to minimize risk of falling  Review of Systems  Constitutional: Negative for activity change and fever.  HENT: Positive for congestion. Negative for ear pain and rhinorrhea.   Eyes: Negative for discharge.  Respiratory: Positive for cough. Negative for shortness of breath and wheezing.   Cardiovascular: Negative for chest pain.       Objective:   Physical Exam Vitals reviewed.  Constitutional:      General: She is not in acute distress. HENT:     Head: Normocephalic.  Cardiovascular:     Rate and Rhythm: Normal rate and regular rhythm.     Heart sounds: Normal heart sounds. No murmur heard.   Pulmonary:     Effort: Pulmonary effort is normal.     Breath sounds: Normal breath sounds.  Lymphadenopathy:     Cervical: No cervical adenopathy.  Neurological:     Mental Status: She is alert.    Psychiatric:        Behavior: Behavior normal.           Assessment & Plan:  1. Essential hypertension, benign Blood pressure good control continue current medications watch salt diet stay active  2. COPD (chronic obstructive pulmonary disease) with chronic bronchitis ([place]) Patient staying away from smoking, uses her inhalers but sometimes cannot afford them states her breathing doing all right but recently with severe bronchial infection we will go ahead with Augmentin but hold off on prednisone flu shot today  3. Other specified hypothyroidism Thyroid good control continue current measures  4. Mixed hyperlipidemia Cholesterol good control continue medication watch diet closely check lab work in the spring  5. Need for vaccination Flu shot today - Flu Vaccine QUAD 36+ mos IM COPD exacerbation hold off on steroids go ahead with Augmentin  Follow-up 6 months

## 2020-08-21 DIAGNOSIS — Z1231 Encounter for screening mammogram for malignant neoplasm of breast: Secondary | ICD-10-CM | POA: Diagnosis not present

## 2020-08-28 DIAGNOSIS — R69 Illness, unspecified: Secondary | ICD-10-CM | POA: Diagnosis not present

## 2020-09-27 ENCOUNTER — Other Ambulatory Visit: Payer: Self-pay | Admitting: *Deleted

## 2020-09-27 DIAGNOSIS — Z87891 Personal history of nicotine dependence: Secondary | ICD-10-CM

## 2020-10-16 ENCOUNTER — Telehealth: Payer: Self-pay | Admitting: Acute Care

## 2020-10-16 NOTE — Telephone Encounter (Signed)
I have spoke with [name] about scheduling her LCS CT for 2022. Her LCS CT has been scheduled on 11/15/2020 @ 9:00am at [facility] and she is aware

## 2020-11-15 ENCOUNTER — Ambulatory Visit (HOSPITAL_COMMUNITY)
Admission: RE | Admit: 2020-11-15 | Discharge: 2020-11-15 | Disposition: A | Discharge status: Home / Self Care | Payer: Medicare Other | Source: Ambulatory Visit | Attending: Acute Care | Admitting: Acute Care

## 2020-11-15 ENCOUNTER — Other Ambulatory Visit: Payer: Self-pay

## 2020-11-15 DIAGNOSIS — Z87891 Personal history of nicotine dependence: Secondary | ICD-10-CM | POA: Diagnosis not present

## 2020-11-26 ENCOUNTER — Telehealth: Payer: Self-pay

## 2020-11-26 DIAGNOSIS — E782 Mixed hyperlipidemia: Secondary | ICD-10-CM

## 2020-11-26 DIAGNOSIS — I10 Essential (primary) hypertension: Secondary | ICD-10-CM

## 2020-11-26 DIAGNOSIS — Z79899 Other long term (current) drug therapy: Secondary | ICD-10-CM

## 2020-11-26 DIAGNOSIS — E039 Hypothyroidism, unspecified: Secondary | ICD-10-CM

## 2020-11-26 NOTE — Telephone Encounter (Signed)
Pt had lung scan at [facility] February 24th and she never got a call of follow up on it and wanted to get with Dr Nicki Reaper on this.  Pt call back [phone]

## 2020-11-27 ENCOUNTER — Other Ambulatory Visit: Payer: Self-pay | Admitting: Family Medicine

## 2020-11-27 NOTE — Telephone Encounter (Signed)
TSH, lipid, met 7, liver Hyperlipidemia hypertension hypothyroidism She is aware that we are ordering these labs and she will do these the week before her follow-up in May I did discuss the results of her CT scan with her.  She is aware that no cancer was found.  She is also aware that there is coronary atherosclerosis and aortic atherosclerosis.  She will continue her medications.

## 2020-11-27 NOTE — Progress Notes (Signed)
Please call patient and let them  know their  low dose Ct was read as a Lung RADS 2: nodules that are benign in appearance and behavior with a very low likelihood of becoming a clinically active cancer due to size or lack of growth. Recommendation per radiology is for a repeat LDCT in 12 months. .Please let them  know we will order and schedule their  annual screening scan for 10/2021. Please let them  know there was notation of CAD on their  scan.  Please remind the patient  that this is a non-gated exam therefore degree or severity of disease  cannot be determined. Please have them  follow up with their PCP regarding potential risk factor modification, dietary therapy or pharmacologic therapy if clinically indicated. Pt.  is  currently on statin therapy. Please place order for annual  screening scan for  10/2021 and fax results to PCP. Thanks so much.  Please have patient follow up with cards if he has not seen them recently. He is on statin medication and is followed by cardiology. Thanks

## 2020-11-27 NOTE — Telephone Encounter (Signed)
Orders put in

## 2020-11-29 ENCOUNTER — Other Ambulatory Visit: Payer: Self-pay | Admitting: *Deleted

## 2020-11-29 DIAGNOSIS — Z87891 Personal history of nicotine dependence: Secondary | ICD-10-CM

## 2020-12-11 ENCOUNTER — Other Ambulatory Visit: Payer: Self-pay

## 2020-12-11 MED ORDER — TRELEGY ELLIPTA 100-62.5-25 MCG/INH IN AEPB: INHALATION_SPRAY | RESPIRATORY_TRACT | 5 refills | Status: DC

## 2021-01-17 DIAGNOSIS — E782 Mixed hyperlipidemia: Secondary | ICD-10-CM | POA: Diagnosis not present

## 2021-01-17 DIAGNOSIS — I10 Essential (primary) hypertension: Secondary | ICD-10-CM | POA: Diagnosis not present

## 2021-01-17 DIAGNOSIS — Z79899 Other long term (current) drug therapy: Secondary | ICD-10-CM | POA: Diagnosis not present

## 2021-01-17 DIAGNOSIS — E039 Hypothyroidism, unspecified: Secondary | ICD-10-CM | POA: Diagnosis not present

## 2021-01-18 LAB — HEPATIC FUNCTION PANEL
ALT: 12 IU/L (ref 0–32)
AST: 18 IU/L (ref 0–40)
Albumin: 4.4 g/dL (ref 3.8–4.8)
Alkaline Phosphatase: 66 IU/L (ref 44–121)
Bilirubin Total: 0.6 mg/dL (ref 0.0–1.2)
Bilirubin, Direct: 0.16 mg/dL (ref 0.00–0.40)
Total Protein: 6.6 g/dL (ref 6.0–8.5)

## 2021-01-18 LAB — LIPID PANEL
Chol/HDL Ratio: 2.5 ratio (ref 0.0–4.4)
Cholesterol, Total: 139 mg/dL (ref 100–199)
HDL: 55 mg/dL (ref >39)
LDL Chol Calc (NIH): 61 mg/dL (ref 0–99)
Triglycerides: 135 mg/dL (ref 0–149)
VLDL Cholesterol Cal: 23 mg/dL (ref 5–40)

## 2021-01-18 LAB — BASIC METABOLIC PANEL
BUN/Creatinine Ratio: 17 (ref 12–28)
BUN: 13 mg/dL (ref 8–27)
CO2: 22 mmol/L (ref 20–29)
Calcium: 9.1 mg/dL (ref 8.7–10.3)
Chloride: 104 mmol/L (ref 96–106)
Creatinine, Ser: 0.77 mg/dL (ref 0.57–1.00)
Glucose: 85 mg/dL (ref 65–99)
Potassium: 4.5 mmol/L (ref 3.5–5.2)
Sodium: 143 mmol/L (ref 134–144)
eGFR: 87 mL/min/{1.73_m2} (ref >59)

## 2021-01-18 LAB — TSH: TSH: 0.297 u[IU]/mL — ABNORMAL LOW (ref 0.450–4.500)

## 2021-01-21 ENCOUNTER — Encounter: Payer: Self-pay | Admitting: Family Medicine

## 2021-01-21 ENCOUNTER — Other Ambulatory Visit: Payer: Self-pay

## 2021-01-21 ENCOUNTER — Ambulatory Visit (INDEPENDENT_AMBULATORY_CARE_PROVIDER_SITE_OTHER): Payer: Medicare Other | Admitting: Family Medicine

## 2021-01-21 VITALS — BP 132/60 | HR 72 | Temp 97.5°F | Ht 61.0 in | Wt 147.0 lb

## 2021-01-21 DIAGNOSIS — E782 Mixed hyperlipidemia: Secondary | ICD-10-CM

## 2021-01-21 DIAGNOSIS — J449 Chronic obstructive pulmonary disease, unspecified: Secondary | ICD-10-CM | POA: Diagnosis not present

## 2021-01-21 DIAGNOSIS — E038 Other specified hypothyroidism: Secondary | ICD-10-CM | POA: Diagnosis not present

## 2021-01-21 DIAGNOSIS — D692 Other nonthrombocytopenic purpura: Secondary | ICD-10-CM

## 2021-01-21 DIAGNOSIS — I10 Essential (primary) hypertension: Secondary | ICD-10-CM | POA: Diagnosis not present

## 2021-01-21 MED ORDER — LEVOTHYROXINE SODIUM 125 MCG PO TABS: ORAL_TABLET | ORAL | 1 refills | Status: DC

## 2021-01-21 NOTE — Progress Notes (Signed)
   Subjective:    Patient ID: [name], female    DOB: Jul 19, 1961, 60 y.o.   MRN: [id number]  HPIfollow up on copd. Pt states no concerns today.  Mild to moderate COPD Using medication on a regular basis Does get out of breath with activity No chest tightness or pain  Reflux under good control with medication when does not use does have trouble  History of coronary artery disease and atherosclerosis at higher risk of stroke and heart attack 81 mg aspirin justified Does get some bruising on the arms but no rectal bleeding  Tolerates cholesterol medicine watches diet stays active      Review of Systems  Constitutional: Negative for activity change and appetite change.  HENT: Negative for congestion and rhinorrhea.   Respiratory: Positive for shortness of breath. Negative for cough.   Cardiovascular: Negative for chest pain and leg swelling.  Gastrointestinal: Negative for abdominal pain, nausea and vomiting.  Skin: Negative for color change.  Neurological: Negative for dizziness and weakness.  Psychiatric/Behavioral: Negative for agitation and confusion.       Objective:   Physical Exam Vitals reviewed.  Constitutional:      General: She is not in acute distress. HENT:     Head: Normocephalic and atraumatic.  Eyes:     General:        Right eye: No discharge.        Left eye: No discharge.  Neck:     Trachea: No tracheal deviation.  Cardiovascular:     Rate and Rhythm: Normal rate and regular rhythm.     Heart sounds: Normal heart sounds. No murmur heard.   Pulmonary:     Effort: Pulmonary effort is normal. No respiratory distress.     Breath sounds: Normal breath sounds.  Lymphadenopathy:     Cervical: No cervical adenopathy.  Skin:    General: Skin is warm and dry.  Neurological:     Mental Status: She is alert.     Coordination: Coordination normal.  Psychiatric:        Behavior: Behavior normal.    Senile purpura noted on the arms        Assessment & Plan:  1. Other specified hypothyroidism Adjust medication recheck labs in the fall half tablet on Monday half tablet on Fridays 1 tablet on all other days  2. Essential hypertension, benign Blood pressure good control continue current measures watch diet  3. Mixed hyperlipidemia Cholesterol good control continue current medication watch diet  4. COPD (chronic obstructive pulmonary disease) with chronic bronchitis (HCC) Continue inhalers, avoid smoking, follow-up if ongoing troubles or worse  5. Senile purpura (HCC) Bruising on the arm due to age 68 mg aspirin  Coronary artery disease without angina 81 mg aspirin  Recheck 6 months

## 2021-01-21 NOTE — Patient Instructions (Signed)
1/2 thyroid pill on Monday and 1/2 on Friday morning And one on all other days Recheck in Oct or early November We will do lab work closer to then (call us for lab orders closer to that visit)

## 2021-03-04 ENCOUNTER — Other Ambulatory Visit: Payer: Self-pay | Admitting: Family Medicine

## 2021-04-30 ENCOUNTER — Other Ambulatory Visit: Payer: Self-pay

## 2021-04-30 ENCOUNTER — Encounter: Payer: Self-pay | Admitting: Family Medicine

## 2021-04-30 ENCOUNTER — Ambulatory Visit (INDEPENDENT_AMBULATORY_CARE_PROVIDER_SITE_OTHER): Payer: Medicare Other | Admitting: Family Medicine

## 2021-04-30 VITALS — BP 132/80 | Temp 95.2°F | Wt 147.0 lb

## 2021-04-30 DIAGNOSIS — K921 Melena: Secondary | ICD-10-CM

## 2021-04-30 DIAGNOSIS — R1030 Lower abdominal pain, unspecified: Secondary | ICD-10-CM | POA: Diagnosis not present

## 2021-04-30 LAB — POCT HEMOGLOBIN: Hemoglobin: 14.3 g/dL (ref 11–14.6)

## 2021-04-30 NOTE — Progress Notes (Signed)
   Subjective:    Patient ID: [name], female    DOB: October 19, 1960, 60 y.o.   MRN: [id number]  HPI Pt woke up Saturday with stomach cramps. (Pt was on vacation at the beach and had ate out a lot.). Sunday pt noticed bright red blood and mucous. BM was more of a mushy consistency. Did not notice any blood or mucous yesterday, no cramps yesterday. No issues today. Bowel movements did have bad smell. Pt had pizza, salad and chicken sandwich with lettuce.  Patient denies fever Her hemoglobin today is very good   Review of Systems     Objective:   Physical Exam  General-in no acute distress Eyes-no discharge Lungs-respiratory rate normal, CTA CV-no murmurs,RRR Extremities skin warm dry no edema Neuro grossly normal Behavior normal, alert Abd-tender lower abd no guarding      Assessment & Plan:  Lower abdominal pain Intermittent hematochezia on Sunday Now with occasional mucousy stool with blood mixed in Feeling better today compared to where she was Had colonoscopy in 2019 History of smoking-has quit   Will go ahead and do lab work CBC, CMP Will refer to gastroenterology Will need repeat colonoscopy Will touch base with gastroenterology to see if CT scan would be indicated  Purpose of the CAT scan is to rule out secondary infection.  I did communicate with gastroenterology.  Dr.Rehman recommended CT scan abdomen and pelvis with contrast to rule out ischemic colitis we will set this up for tomorrow morning given that it is near 5 PM currently, I do not feel like the patient needs to go to the ER tonight to get this done

## 2021-05-01 ENCOUNTER — Other Ambulatory Visit: Payer: Self-pay | Admitting: Family Medicine

## 2021-05-01 ENCOUNTER — Ambulatory Visit (HOSPITAL_COMMUNITY)
Admission: RE | Admit: 2021-05-01 | Discharge: 2021-05-01 | Disposition: A | Discharge status: Home / Self Care | Payer: Medicare Other | Source: Ambulatory Visit | Attending: Family Medicine | Admitting: Family Medicine

## 2021-05-01 DIAGNOSIS — R109 Unspecified abdominal pain: Secondary | ICD-10-CM | POA: Diagnosis not present

## 2021-05-01 DIAGNOSIS — K921 Melena: Secondary | ICD-10-CM | POA: Insufficient documentation

## 2021-05-01 DIAGNOSIS — R1030 Lower abdominal pain, unspecified: Secondary | ICD-10-CM | POA: Insufficient documentation

## 2021-05-01 LAB — COMPREHENSIVE METABOLIC PANEL
ALT: 14 IU/L (ref 0–32)
AST: 21 IU/L (ref 0–40)
Albumin/Globulin Ratio: 2.4 — ABNORMAL HIGH (ref 1.2–2.2)
Albumin: 4.7 g/dL (ref 3.8–4.8)
Alkaline Phosphatase: 65 IU/L (ref 44–121)
BUN/Creatinine Ratio: 14 (ref 12–28)
BUN: 11 mg/dL (ref 8–27)
Bilirubin Total: 0.5 mg/dL (ref 0.0–1.2)
CO2: 24 mmol/L (ref 20–29)
Calcium: 9.2 mg/dL (ref 8.7–10.3)
Chloride: 102 mmol/L (ref 96–106)
Creatinine, Ser: 0.79 mg/dL (ref 0.57–1.00)
Globulin, Total: 2 g/dL (ref 1.5–4.5)
Glucose: 76 mg/dL (ref 65–99)
Potassium: 4.6 mmol/L (ref 3.5–5.2)
Sodium: 140 mmol/L (ref 134–144)
Total Protein: 6.7 g/dL (ref 6.0–8.5)
eGFR: 85 mL/min/{1.73_m2} (ref >59)

## 2021-05-01 LAB — CBC WITH DIFFERENTIAL/PLATELET
Basophils Absolute: 0 10*3/uL (ref 0.0–0.2)
Basos: 1 %
EOS (ABSOLUTE): 0.2 10*3/uL (ref 0.0–0.4)
Eos: 2 %
Hematocrit: 42.4 % (ref 34.0–46.6)
Hemoglobin: 14.1 g/dL (ref 11.1–15.9)
Immature Grans (Abs): 0 10*3/uL (ref 0.0–0.1)
Immature Granulocytes: 1 %
Lymphocytes Absolute: 2.9 10*3/uL (ref 0.7–3.1)
Lymphs: 40 %
MCH: 30.5 pg (ref 26.6–33.0)
MCHC: 33.3 g/dL (ref 31.5–35.7)
MCV: 92 fL (ref 79–97)
Monocytes Absolute: 0.6 10*3/uL (ref 0.1–0.9)
Monocytes: 8 %
Neutrophils Absolute: 3.4 10*3/uL (ref 1.4–7.0)
Neutrophils: 48 %
Platelets: 269 10*3/uL (ref 150–450)
RBC: 4.63 x10E6/uL (ref 3.77–5.28)
RDW: 12.7 % (ref 11.7–15.4)
WBC: 7.2 10*3/uL (ref 3.4–10.8)

## 2021-05-01 MED ORDER — IOHEXOL 350 MG/ML SOLN
80.0000 mL | Freq: Once | INTRAVENOUS | Status: AC | PRN
  Administered 2021-05-01: 80 mL via INTRAVENOUS

## 2021-05-06 ENCOUNTER — Encounter (INDEPENDENT_AMBULATORY_CARE_PROVIDER_SITE_OTHER): Payer: Self-pay | Admitting: *Deleted

## 2021-05-06 ENCOUNTER — Other Ambulatory Visit: Payer: Self-pay | Admitting: Family Medicine

## 2021-05-13 ENCOUNTER — Ambulatory Visit: Payer: Medicare Other | Admitting: Family Medicine

## 2021-05-13 ENCOUNTER — Other Ambulatory Visit: Payer: Self-pay | Admitting: Family Medicine

## 2021-05-14 ENCOUNTER — Ambulatory Visit (INDEPENDENT_AMBULATORY_CARE_PROVIDER_SITE_OTHER): Payer: Medicare Other | Admitting: Family Medicine

## 2021-05-14 ENCOUNTER — Other Ambulatory Visit: Payer: Self-pay

## 2021-05-14 DIAGNOSIS — S61340A Puncture wound with foreign body of right index finger with damage to nail, initial encounter: Secondary | ICD-10-CM

## 2021-05-14 DIAGNOSIS — S6991XA Unspecified injury of right wrist, hand and finger(s), initial encounter: Secondary | ICD-10-CM

## 2021-05-14 DIAGNOSIS — Z23 Encounter for immunization: Secondary | ICD-10-CM

## 2021-05-14 MED ORDER — CEPHALEXIN 500 MG PO CAPS
ORAL_CAPSULE | ORAL | 0 refills | Status: DC
Start: 2021-05-14 — End: 2021-07-04

## 2021-05-14 NOTE — Progress Notes (Signed)
   Subjective:    Patient ID: [name], female    DOB: 1961/04/12, 60 y.o.   MRN: [id number]  HPI Pt walks into office with fish hook in right pointer finger.  Patient unfortunately got jabbed with a fishhook she could not get it out.  It is in her index finger on the right side.  Denies any numbness.  Happened earlier today  Review of Systems     Objective:   Physical Exam Patient with a puncture foreign body of the fistula.  In the pulp of the index finger.  With consent area was injected with 1% lidocaine without epinephrine small amount injected.  #11 blade was used to make a small cut near the fistula base, forceps were used to firmly grip the fistula can remove it       Assessment & Plan:  Foreign body Fishhook injury Keflex 3 times daily for the next 5 days if dramatically better within 3 days may stop the medicine Tetanus shot today Warning signs what to watch for discussed

## 2021-05-20 DIAGNOSIS — R300 Dysuria: Secondary | ICD-10-CM | POA: Diagnosis not present

## 2021-05-20 DIAGNOSIS — Z124 Encounter for screening for malignant neoplasm of cervix: Secondary | ICD-10-CM | POA: Diagnosis not present

## 2021-05-20 DIAGNOSIS — Z0000 Encounter for general adult medical examination without abnormal findings: Secondary | ICD-10-CM | POA: Diagnosis not present

## 2021-07-04 ENCOUNTER — Other Ambulatory Visit: Payer: Self-pay | Admitting: Family Medicine

## 2021-07-04 ENCOUNTER — Telehealth: Payer: Self-pay | Admitting: Family Medicine

## 2021-07-04 DIAGNOSIS — E039 Hypothyroidism, unspecified: Secondary | ICD-10-CM

## 2021-07-04 DIAGNOSIS — E782 Mixed hyperlipidemia: Secondary | ICD-10-CM

## 2021-07-04 NOTE — Telephone Encounter (Signed)
Lipid, TSH Other lab work was completed in August and does not need to be repeated at this time

## 2021-07-04 NOTE — Telephone Encounter (Signed)
Patient is needing labs done for 6 month follow up on 11/15

## 2021-07-05 NOTE — Telephone Encounter (Signed)
Lab orders placed and pt is aware 

## 2021-07-11 ENCOUNTER — Other Ambulatory Visit: Payer: Self-pay | Admitting: Family Medicine

## 2021-07-29 DIAGNOSIS — E039 Hypothyroidism, unspecified: Secondary | ICD-10-CM | POA: Diagnosis not present

## 2021-07-29 DIAGNOSIS — E782 Mixed hyperlipidemia: Secondary | ICD-10-CM | POA: Diagnosis not present

## 2021-07-30 LAB — LIPID PANEL
Chol/HDL Ratio: 3.5 ratio (ref 0.0–4.4)
Cholesterol, Total: 170 mg/dL (ref 100–199)
HDL: 49 mg/dL (ref >39)
LDL Chol Calc (NIH): 94 mg/dL (ref 0–99)
Triglycerides: 154 mg/dL — ABNORMAL HIGH (ref 0–149)
VLDL Cholesterol Cal: 27 mg/dL (ref 5–40)

## 2021-07-30 LAB — TSH: TSH: 0.462 u[IU]/mL (ref 0.450–4.500)

## 2021-08-06 ENCOUNTER — Ambulatory Visit (INDEPENDENT_AMBULATORY_CARE_PROVIDER_SITE_OTHER): Payer: Medicare Other | Admitting: Family Medicine

## 2021-08-06 ENCOUNTER — Other Ambulatory Visit: Payer: Self-pay

## 2021-08-06 VITALS — BP 124/70 | HR 66 | Temp 97.3°F | Ht 61.0 in | Wt 145.0 lb

## 2021-08-06 DIAGNOSIS — R42 Dizziness and giddiness: Secondary | ICD-10-CM

## 2021-08-06 DIAGNOSIS — D692 Other nonthrombocytopenic purpura: Secondary | ICD-10-CM

## 2021-08-06 DIAGNOSIS — K219 Gastro-esophageal reflux disease without esophagitis: Secondary | ICD-10-CM

## 2021-08-06 DIAGNOSIS — Z23 Encounter for immunization: Secondary | ICD-10-CM

## 2021-08-06 DIAGNOSIS — J449 Chronic obstructive pulmonary disease, unspecified: Secondary | ICD-10-CM

## 2021-08-06 DIAGNOSIS — E782 Mixed hyperlipidemia: Secondary | ICD-10-CM

## 2021-08-06 DIAGNOSIS — E038 Other specified hypothyroidism: Secondary | ICD-10-CM | POA: Diagnosis not present

## 2021-08-06 DIAGNOSIS — I10 Essential (primary) hypertension: Secondary | ICD-10-CM

## 2021-08-06 NOTE — Assessment & Plan Note (Addendum)
-   BP WNL today at 124/70 - Lopressor refilled - Lipid Profile with GFR, TSH with Free T4 ordered for next visit.

## 2021-08-06 NOTE — Patient Instructions (Signed)

## 2021-08-06 NOTE — Assessment & Plan Note (Addendum)
-   Patient has no complaints regarding breathing. - Using Ellipta and Albuteral as needed without any issues. - Ellipta refilled

## 2021-08-06 NOTE — Progress Notes (Signed)
   Subjective:    Patient ID: [name], female    DOB: [birth year], 60 y.o.   MRN: [id number]  HPI Patient here for 6 month follow for HTN, HLD, Thyroid disease, COPD.  Patient relates compliance with BP medications. Recent LDL at 94, however, patient admits to eating a lot ice cream and "junk".  Patient has no complaints regarding COPD.   Overall, patient states that she is doing well and has no complaints.   Patient reports home BPs around 125/60s.  Review of Systems Patient denies any SOB Patient denies any chest pain or tightness     Objective:   Physical Exam Constitutional:      Appearance: Normal appearance.  Cardiovascular:     Rate and Rhythm: Normal rate and regular rhythm.  Pulmonary:     Effort: Pulmonary effort is normal. No respiratory distress.     Breath sounds: Normal breath sounds. No wheezing or rhonchi.  Skin:    General: Skin is warm and dry.     Comments: Senile purpura noticed to upper extremities.  Neurological:     General: No focal deficit present.     Mental Status: She is alert and oriented to person, place, and time.  Psychiatric:        Mood and Affect: Mood normal.        Behavior: Behavior normal.        Thought Content: Thought content normal.          Assessment & Plan:  1. Essential hypertension, benign - BP WNL today at 124/70 - Lopressor refilled - Lipid Profile with GFR, TSH with Free T4 ordered for next visit.   2. COPD (chronic obstructive pulmonary disease) with chronic bronchitis ([place]) - Patient has no complaints regarding breathing. - Using Ellipta and Albuteral as needed without any issues. - Ellipta refilled  3. Other specified hypothyroidism - TSH WNL at 0.462. - Will check TSH and Free T4 at next visit.  4. Mixed hyperlipidemia - LDL at 94. Goal is less than 70. - Patient to work on diet and decreasing "junk" consumption. - D/C Simvastatin. - Start Rosuvastatin 20mg  daily.  - Lipid panel and Chem  Profile with GFR ordered for next visit.   5. Immunization due - Patient to be called once Pneumococcal vaccine is available at clinic.  - Flu Vaccine QUAD 4mo+IM (Fluarix, Fluzone & Alfiuria Quad PF)   6. Gastroesophageal reflux disease, unspecified whether esophagitis present - Protonix refilled.    7. Senile Purpura - Stable

## 2021-08-06 NOTE — Assessment & Plan Note (Addendum)
TSH WNL at 0.462. Will check TSH and Free T4 at next visit.

## 2021-08-06 NOTE — Assessment & Plan Note (Signed)
Protonix refilled

## 2021-08-06 NOTE — Assessment & Plan Note (Addendum)
-   LDL at 94. Goal is less than 70. - D/C Simvastatin. - Start Rosuvastatin 20mg  daily.  - Lipid panel and Chem Profile with GFR ordered for next visit.

## 2021-08-07 MED ORDER — LEVOTHYROXINE SODIUM 125 MCG PO TABS: ORAL_TABLET | ORAL | 1 refills | Status: DC

## 2021-08-07 MED ORDER — PANTOPRAZOLE SODIUM 40 MG PO TBEC: 40.0000 mg | DELAYED_RELEASE_TABLET | Freq: Every day | ORAL | 1 refills | Status: DC

## 2021-08-07 MED ORDER — ROSUVASTATIN CALCIUM 20 MG PO TABS: 20.0000 mg | ORAL_TABLET | Freq: Every day | ORAL | 3 refills | Status: DC

## 2021-08-07 MED ORDER — TRELEGY ELLIPTA 100-62.5-25 MCG/ACT IN AEPB: INHALATION_SPRAY | RESPIRATORY_TRACT | 3 refills | Status: DC

## 2021-08-07 MED ORDER — METOPROLOL TARTRATE 25 MG PO TABS: 12.5000 mg | ORAL_TABLET | Freq: Two times a day (BID) | ORAL | 1 refills | Status: DC

## 2021-08-13 DIAGNOSIS — H5203 Hypermetropia, bilateral: Secondary | ICD-10-CM | POA: Diagnosis not present

## 2021-08-14 DIAGNOSIS — J449 Chronic obstructive pulmonary disease, unspecified: Secondary | ICD-10-CM | POA: Diagnosis not present

## 2021-08-19 DIAGNOSIS — J449 Chronic obstructive pulmonary disease, unspecified: Secondary | ICD-10-CM | POA: Diagnosis not present

## 2021-08-22 ENCOUNTER — Telehealth: Payer: Self-pay | Admitting: *Deleted

## 2021-08-22 ENCOUNTER — Ambulatory Visit (INDEPENDENT_AMBULATORY_CARE_PROVIDER_SITE_OTHER): Payer: Medicare Other | Admitting: Family Medicine

## 2021-08-22 ENCOUNTER — Other Ambulatory Visit: Payer: Self-pay

## 2021-08-22 ENCOUNTER — Encounter: Payer: Self-pay | Admitting: Family Medicine

## 2021-08-22 VITALS — Temp 97.7°F | Wt 148.2 lb

## 2021-08-22 DIAGNOSIS — J441 Chronic obstructive pulmonary disease with (acute) exacerbation: Secondary | ICD-10-CM

## 2021-08-22 DIAGNOSIS — J019 Acute sinusitis, unspecified: Secondary | ICD-10-CM | POA: Diagnosis not present

## 2021-08-22 MED ORDER — AMOXICILLIN-POT CLAVULANATE 875-125 MG PO TABS: 1.0000 | ORAL_TABLET | Freq: Two times a day (BID) | ORAL | 0 refills | Status: DC

## 2021-08-22 MED ORDER — PREDNISONE 20 MG PO TABS
ORAL_TABLET | ORAL | 0 refills | Status: DC
Start: 2021-08-22 — End: 2021-09-09

## 2021-08-22 NOTE — Progress Notes (Signed)
   Subjective:    Patient ID: [name], female    DOB: [birth year], 60 y.o.   MRN: [id number]  HPI Pt having thick yellow chest congestion and cough, right side throat pain, ear pain and head ache. Going on for 2 days. OTC mucinex and Delysum being used Cough congestion some tightness in the lungs some wheezing gets a little bit out of breath with activity  Review of Systems     Objective:   Physical Exam Gen-NAD not toxic TMS-normal bilateral T- normal no redness Chest-has bilateral wheezes with coughing respiratory rate normal no crackles CV RRR no murmur Skin-warm dry Neuro-grossly normal        Assessment & Plan:  COPD exacerbation Antibiotic steroid Warning signs discussed Follow-up if progressive troubles or worse

## 2021-08-22 NOTE — Chronic Care Management (AMB) (Signed)
  Chronic Care Management   Note  08/22/2021 Name: [name] MRN: [id number] DOB: [birth year]  [name] is a 60 y.o. year old female who is a primary care patient of Luking, Elayne Snare, MD. I reached out to [name] by phone today in response to a referral sent by [name]'s PCP.  [name] was given information about Chronic Care Management services today including:  CCM service includes personalized support from designated clinical staff supervised by her physician, including individualized plan of care and coordination with other care providers 24/7 contact phone numbers for assistance for urgent and routine care needs. Service will only be billed when office clinical staff spend 20 minutes or more in a month to coordinate care. Only one practitioner may furnish and bill the service in a calendar month. The patient may stop CCM services at any time (effective at the end of the month) by phone call to the office staff. The patient is responsible for co-pay (up to 20% after annual deductible is met) if co-pay is required by the individual health plan.   Patient agreed to services and verbal consent obtained.   Follow up plan: Telephone appointment with care management team member scheduled for:08/28/21  [facility]  Direct Dial: [phone]

## 2021-08-22 NOTE — Patient Instructions (Signed)

## 2021-08-23 LAB — SPECIMEN STATUS REPORT

## 2021-08-23 LAB — COVID-19, FLU A+B AND RSV
Influenza A, NAA: NOT DETECTED
Influenza B, NAA: NOT DETECTED
RSV, NAA: NOT DETECTED
SARS-CoV-2, NAA: NOT DETECTED

## 2021-08-28 ENCOUNTER — Ambulatory Visit: Payer: Medicare Other | Admitting: *Deleted

## 2021-08-28 DIAGNOSIS — J449 Chronic obstructive pulmonary disease, unspecified: Secondary | ICD-10-CM

## 2021-08-28 DIAGNOSIS — I2510 Atherosclerotic heart disease of native coronary artery without angina pectoris: Secondary | ICD-10-CM

## 2021-08-28 NOTE — Chronic Care Management (AMB) (Signed)
Chronic Care Management   CCM RN Visit Note  08/28/2021 Name: [name] MRN: [id number] DOB: Jul 24, 1961  Subjective: [name] is a 60 y.o. year old female who is a primary care patient of Luking, Elayne Snare, MD. The care management team was consulted for assistance with disease management and care coordination needs.    Engaged with patient by telephone for initial visit in response to provider referral for case management and/or care coordination services.   Consent to Services:  The patient was given information about Chronic Care Management services, agreed to services, and gave verbal consent prior to initiation of services.  Please see initial visit note for detailed documentation.   Patient agreed to services and verbal consent obtained.   Assessment: Review of patient past medical history, allergies, medications, health status, including review of consultants reports, laboratory and other test data, was performed as part of comprehensive evaluation and provision of chronic care management services.   SDOH (Social Determinants of Health) assessments and interventions performed:  SDOH Interventions    Flowsheet Row Most Recent Value  SDOH Interventions   Food Insecurity Interventions Intervention Not Indicated  Transportation Interventions Intervention Not Indicated        CCM Care Plan  No Known Allergies  Outpatient Encounter Medications as of 08/28/2021  Medication Sig   albuterol (PROVENTIL) (2.5 MG/3ML) 0.083% nebulizer solution Take 3 mLs (2.5 mg total) by nebulization every 4 (four) hours as needed for wheezing or shortness of breath.   amoxicillin-clavulanate (AUGMENTIN) 875-125 MG tablet Take 1 tablet by mouth 2 (two) times daily.   aspirin 81 MG EC tablet Take 81 mg by mouth at bedtime.   Cholecalciferol (VITAMIN D3) 400 units CAPS Take 400 Units by mouth at bedtime.    Fluticasone-Umeclidin-Vilant (TRELEGY ELLIPTA) 100-62.5-25 MCG/ACT AEPB INHALE 1  PUFF BY MOUTH ONCE DAILY *RINSE MOUTH AFTER USE*   levothyroxine (SYNTHROID) 125 MCG tablet 1 qam all days except Mondays and fridays  use 1/2 in the am.   loratadine (CLARITIN) 10 MG tablet Take 10 mg by mouth daily.   metoprolol tartrate (LOPRESSOR) 25 MG tablet Take 0.5 tablets (12.5 mg total) by mouth 2 (two) times daily.   Multiple Vitamin (MULTIVITAMIN) tablet Take 1 tablet by mouth daily.   nitroGLYCERIN (NITROSTAT) 0.4 MG SL tablet Place 1 tablet (0.4 mg total) under the tongue every 5 (five) minutes x 3 doses as needed. (Patient taking differently: Place 0.4 mg under the tongue every 5 (five) minutes x 3 doses as needed for chest pain.)   pantoprazole (PROTONIX) 40 MG tablet Take 1 tablet (40 mg total) by mouth daily.   predniSONE (DELTASONE) 20 MG tablet 3qd for 2d then 2qd for 2d then 1qd for 2d   rosuvastatin (CRESTOR) 20 MG tablet Take 1 tablet (20 mg total) by mouth daily.   No facility-administered encounter medications on file as of 08/28/2021.    Patient Active Problem List   Diagnosis Date Noted   GERD (gastroesophageal reflux disease) 08/06/2021   Senile purpura ([place]) 01/21/2021   History of colonic polyps 03/12/2018   Skin cancer 05/11/2017   Hypothyroidism 08/23/2014   COPD (chronic obstructive pulmonary disease) with chronic bronchitis ([place]) 08/23/2014   Mixed hyperlipidemia 05/15/2009   Essential hypertension, benign 05/15/2009   CAD, NATIVE VESSEL 05/15/2009    Conditions to be addressed/monitored:CAD and COPD  Care Plan : RN Care Manager Plan of Care  Updates made by Kassie Mends, RN since 08/28/2021 12:00 AM  Problem: No plan of care established for management of chronic disease states  (COPD, CAD)   Priority: High     Long-Range Goal: Development of plan of care for chronic disease management  (COPD, CAD)   Start Date: 08/28/2021  Expected End Date: 02/24/2022  Priority: High  Note:   Current Barriers:  Knowledge Deficits related to plan of care  for management of CAD and COPD  No Advanced Directives in place- pt requests packet be mailed Pt reports she lives with spouse, is independent in all aspects of her care, continues to drive, recently diagnosed with bronchitis and finishing antibiotic and prednisone and reports "feel much better". Pt needs reinforcement of COPD action plan, management of CAD, diet.  RNCM Clinical Goal(s):  Patient will verbalize understanding of plan for management of CAD and COPD as evidenced by patient report, review of EHR and  through collaboration with RN Care manager, provider, and care team.   Interventions: 1:1 collaboration with primary care provider regarding development and update of comprehensive plan of care as evidenced by provider attestation and co-signature Inter-disciplinary care team collaboration (see longitudinal plan of care) Evaluation of current treatment plan related to  self management and patient's adherence to plan as established by provider   CAD Interventions: (Status:  New goal.) Long Term Goal Assessed understanding of CAD diagnosis Medications reviewed including medications utilized in CAD treatment plan Provided education on importance of blood pressure control in management of CAD Provided education on Importance of limiting foods high in cholesterol Counseled on importance of regular laboratory monitoring as prescribed Counseled on the importance of exercise goals with target of 150 minutes per week Reviewed Importance of taking all medications as prescribed Reviewed Importance of attending all scheduled provider appointments Screening for signs and symptoms of depression related to chronic disease state Assessed social determinant of health barriers Advanced directives packet mailed Education mailed- heart healthy diet RN care manager contact information given  COPD Interventions:  (Status:  New goal.) Long Term Goal Provided patient with basic written and verbal COPD  education on self care/management/and exacerbation prevention Provided instruction about proper use of medications used for management of COPD including inhalers Advised patient to self assesses COPD action plan zone and make appointment with provider if in the yellow zone for 48 hours without improvement Advised patient to engage in light exercise as tolerated 3-5 days a week to aid in the the management of COPD Provided education about and advised patient to utilize infection prevention strategies to reduce risk of respiratory infection  Education mailed- COPD action plan  Patient Goals/Self-Care Activities: Take medications as prescribed   Attend all scheduled provider appointments Perform all self care activities independently  Perform IADL's (shopping, preparing meals, housekeeping, managing finances) independently Call provider office for new concerns or questions  Please look over and complete advanced directives mailed to you Education mailed- COPD action plan and heart healthy diet Please follow COPD action plan, call your doctor early for change in health status, symptoms Practice good handwashing, wear a mask as needed Try to walk daily, (outside if weather permitting) Call RN care manager for any questions at [phone]  Follow Up Plan:  Telephone follow up appointment with care management team member scheduled for:  11/06/2021       Plan:Telephone follow up appointment with care management team member scheduled for:  11/06/2021  Jacqlyn Larsen [facility], BSN RN Case Manager [place] [phone]

## 2021-08-28 NOTE — Patient Instructions (Signed)
Visit Information   Thank you for taking time to visit with me today. Please don't hesitate to contact me if I can be of assistance to you before our next scheduled telephone appointment.  Following are the goals we discussed today:  Take medications as prescribed   Attend all scheduled provider appointments Perform all self care activities independently  Perform IADL's (shopping, preparing meals, housekeeping, managing finances) independently Call provider office for new concerns or questions  Please look over and complete advanced directives mailed to you Education mailed- COPD action plan and heart healthy diet Please follow COPD action plan, call your doctor early for change in health status, symptoms Practice good handwashing, wear a mask as needed Try to walk daily, (outside if weather permitting) Call RN care manager for any questions at [phone]  Our next appointment is by telephone on 11/06/2021 at 9 am  Please call the care guide team at [phone] if you need to cancel or reschedule your appointment.   If you are experiencing a Mental Health or [place] or need someone to talk to, please call the [place] National Suicide Prevention Lifeline: 213-146-4494 or TTY: 313-734-6944 TTY (212) 580-1203) to talk to a trained counselor call 1-800-273-TALK (toll free, 24 hour hotline) go to [facility] [address], [place] [phone]) call 911   Following is a copy of your full care plan:  Care Plan : RN Care Manager Plan of Care  Updates made by Kassie Mends, RN since 08/28/2021 12:00 AM     Problem: No plan of care established for management of chronic disease states  (COPD, CAD)   Priority: High     Long-Range Goal: Development of plan of care for chronic disease management  (COPD, CAD)   Start Date: 08/28/2021  Expected End Date: 02/24/2022  Priority: High  Note:   Current Barriers:  Knowledge Deficits related  to plan of care for management of CAD and COPD  No Advanced Directives in place- pt requests packet be mailed Pt reports she lives with spouse, is independent in all aspects of her care, continues to drive, recently diagnosed with bronchitis and finishing antibiotic and prednisone and reports "feel much better". Pt needs reinforcement of COPD action plan, management of CAD, diet.  RNCM Clinical Goal(s):  Patient will verbalize understanding of plan for management of CAD and COPD as evidenced by patient report, review of EHR and  through collaboration with RN Care manager, provider, and care team.   Interventions: 1:1 collaboration with primary care provider regarding development and update of comprehensive plan of care as evidenced by provider attestation and co-signature Inter-disciplinary care team collaboration (see longitudinal plan of care) Evaluation of current treatment plan related to  self management and patient's adherence to plan as established by provider   CAD Interventions: (Status:  New goal.) Long Term Goal Assessed understanding of CAD diagnosis Medications reviewed including medications utilized in CAD treatment plan Provided education on importance of blood pressure control in management of CAD Provided education on Importance of limiting foods high in cholesterol Counseled on importance of regular laboratory monitoring as prescribed Counseled on the importance of exercise goals with target of 150 minutes per week Reviewed Importance of taking all medications as prescribed Reviewed Importance of attending all scheduled provider appointments Screening for signs and symptoms of depression related to chronic disease state Assessed social determinant of health barriers Advanced directives packet mailed Education mailed- heart healthy diet RN care manager contact information given  COPD Interventions:  (Status:  New goal.) Long Term Goal Provided patient with basic written  and verbal COPD education on self care/management/and exacerbation prevention Provided instruction about proper use of medications used for management of COPD including inhalers Advised patient to self assesses COPD action plan zone and make appointment with provider if in the yellow zone for 48 hours without improvement Advised patient to engage in light exercise as tolerated 3-5 days a week to aid in the the management of COPD Provided education about and advised patient to utilize infection prevention strategies to reduce risk of respiratory infection  Education mailed- COPD action plan  Patient Goals/Self-Care Activities: Take medications as prescribed   Attend all scheduled provider appointments Perform all self care activities independently  Perform IADL's (shopping, preparing meals, housekeeping, managing finances) independently Call provider office for new concerns or questions  Please look over and complete advanced directives mailed to you Education mailed- COPD action plan and heart healthy diet Please follow COPD action plan, call your doctor early for change in health status, symptoms Practice good handwashing, wear a mask as needed Try to walk daily, (outside if weather permitting) Call RN care manager for any questions at [phone]  Follow Up Plan:  Telephone follow up appointment with care management team member scheduled for:  11/06/2021       Consent to CCM Services: [name] was given information about Chronic Care Management services including:  CCM service includes personalized support from designated clinical staff supervised by her physician, including individualized plan of care and coordination with other care providers 24/7 contact phone numbers for assistance for urgent and routine care needs. Service will only be billed when office clinical staff spend 20 minutes or more in a month to coordinate care. Only one practitioner may furnish and bill the service  in a calendar month. The patient may stop CCM services at any time (effective at the end of the month) by phone call to the office staff. The patient will be responsible for cost sharing (co-pay) of up to 20% of the service fee (after annual deductible is met).  Patient agreed to services and verbal consent obtained.   The patient verbalized understanding of instructions, educational materials, and care plan provided today and agreed to receive a mailed copy of patient instructions, educational materials, and care plan.   Telephone follow up appointment with care management team member scheduled for:  11/06/2021  Heart-Healthy Eating Plan Heart-healthy meal planning includes: Eating less unhealthy fats. Eating more healthy fats. Making other changes in your diet. Talk with your doctor or a diet specialist (dietitian) to create an eating plan that is right for you. What is my plan? Your doctor may recommend an eating plan that includes: Total fat: ______% or less of total calories a day. Saturated fat: ______% or less of total calories a day. Cholesterol: less than _________mg a day. What are tips for following this plan? Cooking Avoid frying your food. Try to bake, boil, grill, or broil it instead. You can also reduce fat by: Removing the skin from poultry. Removing all visible fats from meats. Steaming vegetables in water or broth. Meal planning  At meals, divide your plate into four equal parts: Fill one-half of your plate with vegetables and green salads. Fill one-fourth of your plate with whole grains. Fill one-fourth of your plate with lean protein foods. Eat 4-5 servings of vegetables per day. A serving of vegetables is: 1 cup of raw or cooked vegetables. 2 cups of  raw leafy greens. Eat 4-5 servings of fruit per day. A serving of fruit is: 1 medium whole fruit.  cup of dried fruit.  cup of fresh, frozen, or canned fruit.  cup of 100% fruit juice. Eat more foods that  have soluble fiber. These are apples, broccoli, carrots, beans, peas, and barley. Try to get 20-30 g of fiber per day. Eat 4-5 servings of nuts, legumes, and seeds per week: 1 serving of dried beans or legumes equals  cup after being cooked. 1 serving of nuts is  cup. 1 serving of seeds equals 1 tablespoon. General information Eat more home-cooked food. Eat less restaurant, buffet, and fast food. Limit or avoid alcohol. Limit foods that are high in starch and sugar. Avoid fried foods. Lose weight if you are overweight. Keep track of how much salt (sodium) you eat. This is important if you have high blood pressure. Ask your doctor to tell you more about this. Try to add vegetarian meals each week. Fats Choose healthy fats. These include olive oil and canola oil, flaxseeds, walnuts, almonds, and seeds. Eat more omega-3 fats. These include salmon, mackerel, sardines, tuna, flaxseed oil, and ground flaxseeds. Try to eat fish at least 2 times each week. Check food labels. Avoid foods with trans fats or high amounts of saturated fat. Limit saturated fats. These are often found in animal products, such as meats, butter, and cream. These are also found in plant foods, such as palm oil, palm kernel oil, and coconut oil. Avoid foods with partially hydrogenated oils in them. These have trans fats. Examples are stick margarine, some tub margarines, cookies, crackers, and other baked goods. What foods can I eat? Fruits All fresh, canned (in natural juice), or frozen fruits. Vegetables Fresh or frozen vegetables (raw, steamed, roasted, or grilled). Green salads. Grains Most grains. Choose whole wheat and whole grains most of the time. Rice and pasta, including brown rice and pastas made with whole wheat. Meats and other proteins Lean, well-trimmed beef, veal, pork, and lamb. Chicken and [place] without skin. All fish and shellfish. Wild duck, rabbit, pheasant, and venison. Egg whites or  low-cholesterol egg substitutes. Dried beans, peas, lentils, and tofu. Seeds and most nuts. Dairy Low-fat or nonfat cheeses, including ricotta and mozzarella. Skim or 1% milk that is liquid, powdered, or evaporated. Buttermilk that is made with low-fat milk. Nonfat or low-fat yogurt. Fats and oils Non-hydrogenated (trans-free) margarines. Vegetable oils, including soybean, sesame, sunflower, olive, peanut, safflower, corn, canola, and cottonseed. Salad dressings or mayonnaise made with a vegetable oil. Beverages Mineral water. Coffee and tea. Diet carbonated beverages. Sweets and desserts Sherbet, gelatin, and fruit ice. Small amounts of dark chocolate. Limit all sweets and desserts. Seasonings and condiments All seasonings and condiments. The items listed above may not be a complete list of foods and drinks you can eat. Contact a dietitian for more options. What foods should I avoid? Fruits Canned fruit in heavy syrup. Fruit in cream or butter sauce. Fried fruit. Limit coconut. Vegetables Vegetables cooked in cheese, cream, or butter sauce. Fried vegetables. Grains Breads that are made with saturated or trans fats, oils, or whole milk. Croissants. Sweet rolls. Donuts. High-fat crackers, such as cheese crackers. Meats and other proteins Fatty meats, such as hot dogs, ribs, sausage, bacon, rib-eye roast or steak. High-fat deli meats, such as salami and bologna. Caviar. Domestic duck and goose. Organ meats, such as liver. Dairy Cream, sour cream, cream cheese, and creamed cottage cheese. Whole-milk cheeses. Whole or 2% milk  that is liquid, evaporated, or condensed. Whole buttermilk. Cream sauce or high-fat cheese sauce. Yogurt that is made from whole milk. Fats and oils Meat fat, or shortening. Cocoa butter, hydrogenated oils, palm oil, coconut oil, palm kernel oil. Solid fats and shortenings, including bacon fat, salt pork, lard, and butter. Nondairy cream substitutes. Salad dressings with  cheese or sour cream. Beverages Regular sodas and juice drinks with added sugar. Sweets and desserts Frosting. Pudding. Cookies. Cakes. Pies. Milk chocolate or white chocolate. Buttered syrups. Full-fat ice cream or ice cream drinks. The items listed above may not be a complete list of foods and drinks to avoid. Contact a dietitian for more information. Summary Heart-healthy meal planning includes eating less unhealthy fats, eating more healthy fats, and making other changes in your diet. Eat a balanced diet. This includes fruits and vegetables, low-fat or nonfat dairy, lean protein, nuts and legumes, whole grains, and heart-healthy oils and fats. This information is not intended to replace advice given to you by your health care provider. Make sure you discuss any questions you have with your health care provider. Document Revised: 01/17/2021 Document Reviewed: 01/17/2021 Elsevier Patient Education  2022 [place]. COPD Action Plan A COPD action plan is a description of what to do when you have a flare (exacerbation) of chronic obstructive pulmonary disease (COPD). Your action plan is a color-coded plan that lists the symptoms that indicate whether your condition is under control and what actions to take. If you have symptoms in the green zone, it means you are doing well that day. If you have symptoms in the yellow zone, it means you are having a bad day or an exacerbation. If you have symptoms in the red zone, you need urgent medical care. Follow the plan that you and your health care provider developed. Review your plan with your health care provider at each visit. Red zone Symptoms in this zone mean that you should get medical help right away. They include: Feeling very short of breath, even when you are resting. Not being able to do any activities because of poor breathing. Not being able to sleep because of poor breathing. Fever or shaking chills. Feeling confused or very  sleepy. Chest pain. Coughing up blood. If you have any of these symptoms, call emergency services (911 in the [place] or go to the nearest emergency room. Yellow zone Symptoms in this zone mean that your condition may be getting worse. They include: Feeling more short of breath than usual. Having less energy for daily activities than usual. Phlegm or mucus that is thicker than usual. Needing to use your rescue inhaler or nebulizer more often than usual. More ankle swelling than usual. Coughing more than usual. Feeling like you have a chest cold. Trouble sleeping due to COPD symptoms. Decreased appetite. COPD medicines not helping as much as usual. If you experience any "yellow" symptoms: Keep taking your daily medicines as directed. Use your quick-relief inhaler as told by your health care provider. If you were prescribed steroid medicine to take by mouth (oral medicine), start taking it as told by your health care provider. If you were prescribed an antibiotic medicine, start taking it as told by your health care provider. Do not stop taking the antibiotic even if you start to feel better. Use oxygen as told by your health care provider. Get more rest. Do your pursed-lip breathing exercises. Do not smoke. Avoid any irritants in the air. If your signs and symptoms do not improve after taking  these steps, call your health care provider right away. Green zone Symptoms in this zone mean that you are doing well. They include: Being able to do your usual activities and exercise. Having the usual amount of coughing, including the same amount of phlegm or mucus. Being able to sleep well. Having a good appetite. Where to find more information: You can find more information about COPD from: American Lung Association, My COPD Action Plan: www.lung.org COPD Foundation: www.copdfoundation.[place]: https://wilson-eaton.com/ Follow these instructions at  home: Continue taking your daily medicines as told by your health care provider. Make sure you receive all the immunizations that your health care provider recommends, especially the pneumococcal and influenza vaccines. Wash your hands often with soap and water. Have family members wash their hands too. Regular hand washing can help prevent infections. Follow your usual exercise and diet plan. Avoid irritants in the air, such as smoke. Do not use any products that contain nicotine or tobacco. These products include cigarettes, chewing tobacco, and vaping devices, such as e-cigarettes. If you need help quitting, ask your health care provider. Summary A COPD action plan tells you what to do when you have a flare (exacerbation) of chronic obstructive pulmonary disease (COPD). Follow each action plan for your symptoms. If you have any symptoms in the red zone, call emergency services (911 in the [place] or go to the nearest emergency room. This information is not intended to replace advice given to you by your health care provider. Make sure you discuss any questions you have with your health care provider. Document Revised: 07/17/2020 Document Reviewed: 07/17/2020 Elsevier Patient Education  2022 [employer].

## 2021-09-09 ENCOUNTER — Other Ambulatory Visit (INDEPENDENT_AMBULATORY_CARE_PROVIDER_SITE_OTHER): Payer: Self-pay

## 2021-09-09 ENCOUNTER — Encounter (INDEPENDENT_AMBULATORY_CARE_PROVIDER_SITE_OTHER): Payer: Self-pay

## 2021-09-09 ENCOUNTER — Other Ambulatory Visit: Payer: Self-pay

## 2021-09-09 ENCOUNTER — Ambulatory Visit (INDEPENDENT_AMBULATORY_CARE_PROVIDER_SITE_OTHER): Payer: Medicare Other | Admitting: Gastroenterology

## 2021-09-09 ENCOUNTER — Encounter (INDEPENDENT_AMBULATORY_CARE_PROVIDER_SITE_OTHER): Payer: Self-pay | Admitting: Gastroenterology

## 2021-09-09 DIAGNOSIS — K921 Melena: Secondary | ICD-10-CM

## 2021-09-09 NOTE — Patient Instructions (Signed)
Schedule  colonoscopy Eat prune and/or kiwi daily

## 2021-09-09 NOTE — Progress Notes (Signed)
Maylon Peppers, M.D. Gastroenterology & Hepatology [facility] [address] [place], [place] [address] Primary Care Physician: Kathyrn Drown, MD [place] [address]  Referring MD: PCP  Chief Complaint:  hematochezia  History of Present Illness: [name] is a 60 y.o. female with PMH CAD and STEMI s/p DES in 2009, HLD, htpothyroidism, COPD, HTN, who presents for evaluation of hematochezia.  Patient reports that in August 2022 she had an episode of cramping in the lower abdominal area, which was followed by a taery bowel movement next daily with large amount of blood in her stool. She states she previously had blood when wiping but never with her stool.  States these episodes were preceded by some constipation with significant straining, but has not been constipated since then and has not had any blood in her stool as well. The patient denies having any nausea, vomiting, fever, chills, hematochezia, melena, hematemesis, abdominal distention, abdominal pain, diarrhea, jaundice, pruritus or weight loss.  Most recent labs from 04/30/2021 showed a hemoglobin of 14.3 with MCV of 92, normal CMP.  Rest of cell lines of CBC were within normal limits.  States she currently has a BM every day.  Last OBS:JGGEZ Last Colonoscopy:2019 - Two small polyps in the cecum. Biopsied. - One small polyp in the sigmoid colon. Biopsied. - External hemorrhoids. - Anal papilla(e) were hypertrophied.  Path 1 TA and 2 HP.  FHx: neg for any gastrointestinal/liver disease, no malignancies Social: previous heavy smoking but now less than before (2 pack a day), neg alcohol or illicit drug use Surgical: no abdominal surgeries  Past Medical History: Past Medical History:  Diagnosis Date   Coronary atherosclerosis of native coronary artery    DES RCA 5/09, nonobstructive left system   Essential hypertension    Hypothyroidism     Mixed hyperlipidemia    NSTEMI (non-ST elevated myocardial infarction) ([place])    2009    Past Surgical History: Past Surgical History:  Procedure Laterality Date   BREAST LUMPECTOMY     COLONOSCOPY N/A 05/26/2018   Procedure: COLONOSCOPY;  Surgeon: Rogene Houston, MD;  Location: [facility];  Service: Endoscopy;  Laterality: N/A;  1030   POLYPECTOMY  05/26/2018   Procedure: POLYPECTOMY;  Surgeon: Rogene Houston, MD;  Location: [facility];  Service: Endoscopy;;  colon   TOTAL ABDOMINAL HYSTERECTOMY      Family History: Family History  Problem Relation Age of Onset   Pneumonia Brother        age 101   Cancer Brother        larynx - age 70   Pneumonia Brother        declining health - age 61    Social History: Social History   Tobacco Use  Smoking Status Former   Packs/day: 2.00   Years: 33.00   Pack years: 66.00   Types: Cigarettes   Start date: 10/27/1972   Quit date: 2007   Years since quitting: 15.9  Smokeless Tobacco Never   Social History   Substance and Sexual Activity  Alcohol Use No   Alcohol/week: 0.0 standard drinks   Social History   Substance and Sexual Activity  Drug Use No    Allergies: No Known Allergies  Medications: Current Outpatient Medications  Medication Sig Dispense Refill   albuterol (PROVENTIL) (2.5 MG/3ML) 0.083% nebulizer solution Take 3 mLs (2.5 mg total) by nebulization every 4 (four) hours as needed for  wheezing or shortness of breath. 150 mL 5   aspirin 81 MG EC tablet Take 81 mg by mouth at bedtime.     Cholecalciferol (VITAMIN D3) 400 units CAPS Take 400 Units by mouth at bedtime.      Fluticasone-Umeclidin-Vilant (TRELEGY ELLIPTA) 100-62.5-25 MCG/ACT AEPB INHALE 1 PUFF BY MOUTH ONCE DAILY *RINSE MOUTH AFTER USE* 60 each 3   levothyroxine (SYNTHROID) 125 MCG tablet 1 qam all days except Mondays and fridays  use 1/2 in the am. 90 tablet 1   loratadine (CLARITIN) 10 MG tablet Take 10 mg by mouth daily.     metoprolol  tartrate (LOPRESSOR) 25 MG tablet Take 0.5 tablets (12.5 mg total) by mouth 2 (two) times daily. 90 tablet 1   Multiple Vitamin (MULTIVITAMIN) tablet Take 1 tablet by mouth daily.     nitroGLYCERIN (NITROSTAT) 0.4 MG SL tablet Place 1 tablet (0.4 mg total) under the tongue every 5 (five) minutes x 3 doses as needed. (Patient taking differently: Place 0.4 mg under the tongue every 5 (five) minutes x 3 doses as needed for chest pain.) 25 tablet 3   pantoprazole (PROTONIX) 40 MG tablet Take 1 tablet (40 mg total) by mouth daily. 90 tablet 1   rosuvastatin (CRESTOR) 20 MG tablet Take 1 tablet (20 mg total) by mouth daily. 90 tablet 3   No current facility-administered medications for this visit.    Review of Systems: GENERAL: negative for malaise, night sweats HEENT: No changes in hearing or vision, no nose bleeds or other nasal problems. NECK: Negative for lumps, goiter, pain and significant neck swelling RESPIRATORY: Negative for cough, wheezing CARDIOVASCULAR: Negative for chest pain, leg swelling, palpitations, orthopnea GI: SEE HPI MUSCULOSKELETAL: Negative for joint pain or swelling, back pain, and muscle pain. SKIN: Negative for lesions, rash PSYCH: Negative for sleep disturbance, mood disorder and recent psychosocial stressors. HEMATOLOGY Negative for prolonged bleeding, bruising easily, and swollen nodes. ENDOCRINE: Negative for cold or heat intolerance, polyuria, polydipsia and goiter. NEURO: negative for tremor, gait imbalance, syncope and seizures. The remainder of the review of systems is noncontributory.   Physical Exam: BP (!) 153/83 (BP Location: Left Arm, Patient Position: Sitting, Cuff Size: Large)    Pulse 64    Temp (!) 97.4 F (36.3 C) (Oral)    Ht 5\' 2"  (1.575 m)    Wt 147 lb 6.4 oz (66.9 kg)    BMI 26.96 kg/m  GENERAL: The patient is AO x3, in no acute distress. HEENT: Head is normocephalic and atraumatic. EOMI are intact. Mouth is well hydrated and without  lesions. NECK: Supple. No masses LUNGS: Clear to auscultation. No presence of rhonchi/wheezing/rales. Adequate chest expansion HEART: RRR, normal s1 and s2. ABDOMEN: Soft, nontender, no guarding, no peritoneal signs, and nondistended. BS +. No masses. RECTAL EXAM: deferred EXTREMITIES: Without any cyanosis, clubbing, rash, lesions or edema. NEUROLOGIC: AOx3, no focal motor deficit. SKIN: no jaundice, no rashes   Imaging/Labs: as above  I personally reviewed and interpreted the available labs, imaging and endoscopic files.  Impression and Plan: [name] is a 60 y.o. female with PMH CAD and STEMI s/p DES in 2009, HLD, htpothyroidism, COPD, HTN, who presents for evaluation of hematochezia.  The patient has presented 1 isolated episode of large hematochezia and some other episodes of scant amount of blood when wiping.  This likely corresponds to hemorrhoidal bleeding given her history of external hemorrhoids but we will proceed with a colonoscopy to rule out other causes of rectal bleeding which  the patient agreed on as she is concerned about this.  She was advised to take more fiber in her diet and supplements, which will help avoid constipation and for episodes of hemorrhoidal bleeding.  -Schedule  colonoscopy -Increase fiber intake in diet   All questions were answered.      Maylon Peppers, MD Gastroenterology and Hepatology [facility]

## 2021-09-19 ENCOUNTER — Telehealth: Payer: Self-pay | Admitting: Family Medicine

## 2021-09-19 MED ORDER — DOXYCYCLINE HYCLATE 100 MG PO TABS: ORAL_TABLET | ORAL | 0 refills | Status: DC

## 2021-09-19 NOTE — Telephone Encounter (Signed)
Doxycycline 100 mg 1 taken twice daily for 10 days take with snack

## 2021-09-19 NOTE — Telephone Encounter (Signed)
Pt seen 08/22/21 for COPD exacerbation. Please advise. Thank you

## 2021-09-19 NOTE — Telephone Encounter (Signed)
Patient is requesting another round of antibiotic called in having chest congestion and coughing up yellow mucus, negative covid /flu test. [facility]

## 2021-09-19 NOTE — Telephone Encounter (Signed)
Patient notified and verbalized understanding. 

## 2021-09-19 NOTE — Telephone Encounter (Signed)
Medication sent to pharmacy. Left message to return call 

## 2021-09-20 ENCOUNTER — Other Ambulatory Visit: Payer: Self-pay | Admitting: Family Medicine

## 2021-10-11 DIAGNOSIS — Z1231 Encounter for screening mammogram for malignant neoplasm of breast: Secondary | ICD-10-CM | POA: Diagnosis not present

## 2021-10-16 ENCOUNTER — Ambulatory Visit (HOSPITAL_COMMUNITY): Payer: Medicare Other | Admitting: Anesthesiology

## 2021-10-16 ENCOUNTER — Ambulatory Visit (HOSPITAL_COMMUNITY)
Admission: RE | Admit: 2021-10-16 | Discharge: 2021-10-16 | Disposition: A | Discharge status: Home / Self Care | Payer: Medicare Other | Source: Ambulatory Visit | Attending: Gastroenterology | Admitting: Gastroenterology

## 2021-10-16 ENCOUNTER — Other Ambulatory Visit: Payer: Self-pay

## 2021-10-16 ENCOUNTER — Encounter (INDEPENDENT_AMBULATORY_CARE_PROVIDER_SITE_OTHER): Payer: Self-pay | Admitting: *Deleted

## 2021-10-16 ENCOUNTER — Encounter (HOSPITAL_COMMUNITY)
Admission: RE | Disposition: A | Discharge status: Home / Self Care | Payer: Self-pay | Source: Ambulatory Visit | Attending: Gastroenterology

## 2021-10-16 ENCOUNTER — Encounter (HOSPITAL_COMMUNITY): Payer: Self-pay | Admitting: Gastroenterology

## 2021-10-16 DIAGNOSIS — K635 Polyp of colon: Secondary | ICD-10-CM | POA: Diagnosis not present

## 2021-10-16 DIAGNOSIS — J449 Chronic obstructive pulmonary disease, unspecified: Secondary | ICD-10-CM | POA: Diagnosis not present

## 2021-10-16 DIAGNOSIS — K5730 Diverticulosis of large intestine without perforation or abscess without bleeding: Secondary | ICD-10-CM | POA: Diagnosis not present

## 2021-10-16 DIAGNOSIS — D124 Benign neoplasm of descending colon: Secondary | ICD-10-CM | POA: Insufficient documentation

## 2021-10-16 DIAGNOSIS — I10 Essential (primary) hypertension: Secondary | ICD-10-CM | POA: Insufficient documentation

## 2021-10-16 DIAGNOSIS — I2510 Atherosclerotic heart disease of native coronary artery without angina pectoris: Secondary | ICD-10-CM | POA: Insufficient documentation

## 2021-10-16 DIAGNOSIS — K625 Hemorrhage of anus and rectum: Secondary | ICD-10-CM | POA: Diagnosis not present

## 2021-10-16 DIAGNOSIS — I252 Old myocardial infarction: Secondary | ICD-10-CM | POA: Diagnosis not present

## 2021-10-16 DIAGNOSIS — K648 Other hemorrhoids: Secondary | ICD-10-CM

## 2021-10-16 DIAGNOSIS — K644 Residual hemorrhoidal skin tags: Secondary | ICD-10-CM | POA: Diagnosis not present

## 2021-10-16 DIAGNOSIS — Z87891 Personal history of nicotine dependence: Secondary | ICD-10-CM | POA: Diagnosis not present

## 2021-10-16 DIAGNOSIS — D122 Benign neoplasm of ascending colon: Secondary | ICD-10-CM | POA: Diagnosis not present

## 2021-10-16 HISTORY — PX: COLONOSCOPY WITH PROPOFOL: SHX5780

## 2021-10-16 HISTORY — PX: POLYPECTOMY: SHX5525

## 2021-10-16 HISTORY — DX: Chronic obstructive pulmonary disease, unspecified: J44.9

## 2021-10-16 LAB — HM COLONOSCOPY

## 2021-10-16 SURGERY — COLONOSCOPY WITH PROPOFOL
Anesthesia: General

## 2021-10-16 MED ORDER — LACTATED RINGERS IV SOLN: INTRAVENOUS | Status: DC

## 2021-10-16 MED ORDER — PHENYLEPHRINE 40 MCG/ML (10ML) SYRINGE FOR IV PUSH (FOR BLOOD PRESSURE SUPPORT)
PREFILLED_SYRINGE | INTRAVENOUS | Status: AC
  Filled 2021-10-16: qty 10

## 2021-10-16 MED ORDER — PHENYLEPHRINE 40 MCG/ML (10ML) SYRINGE FOR IV PUSH (FOR BLOOD PRESSURE SUPPORT)
PREFILLED_SYRINGE | INTRAVENOUS | Status: DC | PRN
  Administered 2021-10-16: 80 ug via INTRAVENOUS
  Administered 2021-10-16: 40 ug via INTRAVENOUS

## 2021-10-16 MED ORDER — LIDOCAINE HCL (CARDIAC) PF 100 MG/5ML IV SOSY
PREFILLED_SYRINGE | INTRAVENOUS | Status: DC | PRN
  Administered 2021-10-16: 50 mg via INTRAVENOUS

## 2021-10-16 MED ORDER — PROPOFOL 500 MG/50ML IV EMUL
INTRAVENOUS | Status: DC | PRN
  Administered 2021-10-16: 150 ug/kg/min via INTRAVENOUS

## 2021-10-16 MED ORDER — PROPOFOL 10 MG/ML IV BOLUS
INTRAVENOUS | Status: DC | PRN
  Administered 2021-10-16: 100 mg via INTRAVENOUS
  Administered 2021-10-16: 30 mg via INTRAVENOUS
  Administered 2021-10-16: 20 mg via INTRAVENOUS

## 2021-10-16 NOTE — Anesthesia Postprocedure Evaluation (Signed)
Anesthesia Post Note  Patient: [name]  Procedure(s) Performed: COLONOSCOPY WITH PROPOFOL POLYPECTOMY  Patient location during evaluation: Phase II Anesthesia Type: General Level of consciousness: awake Pain management: pain level controlled Vital Signs Assessment: post-procedure vital signs reviewed and stable Respiratory status: spontaneous breathing and respiratory function stable Cardiovascular status: blood pressure returned to baseline and stable Postop Assessment: no headache and no apparent nausea or vomiting Anesthetic complications: no Comments: Late entry   No notable events documented.   Last Vitals:  Vitals:   10/16/21 0841 10/16/21 1015  BP: (!) 131/105 118/60  Pulse: 73 74  Resp: 17 18  Temp: 36.4 C (!) 36.4 C  SpO2: 94% 100%    Last Pain:  Vitals:   10/16/21 1015  TempSrc: Oral  PainSc: 0-No pain                 Louann Sjogren

## 2021-10-16 NOTE — H&P (Signed)
[name] is an 61 y.o. female.   Chief Complaint: hematochezia HPI: [name] is a 61 y.o. female with PMH CAD and STEMI s/p DES in 2009, HLD, htpothyroidism, COPD, HTN, who presents for evaluation of hematochezia.   Patient reports that she had presented some episode of rectal bleeding and abdominal pain in August 2022.  Has not present any episodes since then.  States feeling fine and denies having any complaints at the moment. The patient denies having any nausea, vomiting, fever, chills, hematochezia, melena, hematemesis, abdominal distention, abdominal pain, diarrhea, jaundice, pruritus or weight loss.  Last Colonoscopy:2019 - Two small polyps in the cecum. Biopsied. - One small polyp in the sigmoid colon. Biopsied. - External hemorrhoids. - Anal papilla(e) were hypertrophied.   Path 1 TA and 2 HP.  Past Medical History:  Diagnosis Date   COPD (chronic obstructive pulmonary disease) ([place])    Coronary atherosclerosis of native coronary artery    DES RCA 5/09, nonobstructive left system   Essential hypertension    Hypothyroidism    Mixed hyperlipidemia    NSTEMI (non-ST elevated myocardial infarction) ([place])    2009    Past Surgical History:  Procedure Laterality Date   BREAST LUMPECTOMY     COLONOSCOPY N/A 05/26/2018   Procedure: COLONOSCOPY;  Surgeon: Rogene Houston, MD;  Location: [facility];  Service: Endoscopy;  Laterality: N/A;  1030   POLYPECTOMY  05/26/2018   Procedure: POLYPECTOMY;  Surgeon: Rogene Houston, MD;  Location: [facility];  Service: Endoscopy;;  colon   TOTAL ABDOMINAL HYSTERECTOMY      Family History  Problem Relation Age of Onset   Pneumonia Brother        age 27   Cancer Brother        larynx - age 54   Pneumonia Brother        declining health - age 22   Social History:  reports that she quit smoking about 16 years ago. Her smoking use included cigarettes. She started smoking about 49 years ago. She has a 66.00 pack-year  smoking history. She has never used smokeless tobacco. She reports that she does not drink alcohol and does not use drugs.  Allergies: No Known Allergies  Medications Prior to Admission  Medication Sig Dispense Refill   albuterol (PROVENTIL) (2.5 MG/3ML) 0.083% nebulizer solution USE 1 VIAL IN NEBULIZER EVERY 4 HOURS AS NEEDED FOR SHORTNESS OF BREATH 360 mL 5   aspirin 81 MG EC tablet Take 81 mg by mouth at bedtime.     Cholecalciferol (VITAMIN D3) 400 units CAPS Take 400 Units by mouth at bedtime.      Fluticasone-Umeclidin-Vilant (TRELEGY ELLIPTA) 100-62.5-25 MCG/ACT AEPB INHALE 1 PUFF BY MOUTH ONCE DAILY *RINSE MOUTH AFTER USE* 60 each 3   levothyroxine (SYNTHROID) 125 MCG tablet 1 qam all days except Mondays and fridays  use 1/2 in the am. (Patient taking differently: Take 125 mcg by mouth See admin instructions. 125 mg am all days except Mondays and fridays take 62.5 mg in the am.) 90 tablet 1   metoprolol tartrate (LOPRESSOR) 25 MG tablet Take 0.5 tablets (12.5 mg total) by mouth 2 (two) times daily. (Patient taking differently: Take 12.5-25 mg by mouth See admin instructions. Take 25 mg every day EXCEPT: Monday and Friday take 12.5 mg) 90 tablet 1   Multiple Vitamin (MULTIVITAMIN) tablet Take 1 tablet by mouth daily.     pantoprazole (PROTONIX) 40 MG tablet Take 1 tablet (40 mg  total) by mouth daily. 90 tablet 1   rosuvastatin (CRESTOR) 20 MG tablet Take 1 tablet (20 mg total) by mouth daily. 90 tablet 3   vitamin C (ASCORBIC ACID) 500 MG tablet Take 500 mg by mouth daily.     albuterol (VENTOLIN HFA) 108 (90 Base) MCG/ACT inhaler Inhale 1 puff into the lungs every 6 (six) hours as needed for shortness of breath.     nitroGLYCERIN (NITROSTAT) 0.4 MG SL tablet Place 1 tablet (0.4 mg total) under the tongue every 5 (five) minutes x 3 doses as needed. (Patient taking differently: Place 0.4 mg under the tongue every 5 (five) minutes x 3 doses as needed for chest pain.) 25 tablet 3    No  results found for this or any previous visit (from the past 48 hour(s)). No results found.  Review of Systems  Constitutional: Negative.   HENT: Negative.    Eyes: Negative.   Respiratory: Negative.    Cardiovascular: Negative.   Gastrointestinal: Negative.   Endocrine: Negative.   Genitourinary: Negative.   Musculoskeletal: Negative.   Skin: Negative.   Allergic/Immunologic: Negative.   Neurological: Negative.   Hematological: Negative.   Psychiatric/Behavioral: Negative.     Blood pressure (!) 131/105, pulse 73, temperature 97.6 F (36.4 C), temperature source Oral, resp. rate 17, height 5\' 2"  (1.575 m), weight 63.5 kg, SpO2 94 %. Physical Exam  GENERAL: The patient is AO x3, in no acute distress. HEENT: Head is normocephalic and atraumatic. EOMI are intact. Mouth is well hydrated and without lesions. NECK: Supple. No masses LUNGS: Clear to auscultation. No presence of rhonchi/wheezing/rales. Adequate chest expansion HEART: RRR, normal s1 and s2. ABDOMEN: Soft, nontender, no guarding, no peritoneal signs, and nondistended. BS +. No masses. EXTREMITIES: Without any cyanosis, clubbing, rash, lesions or edema. NEUROLOGIC: AOx3, no focal motor deficit. SKIN: no jaundice, no rashes  Assessment/Plan [name] is a 61 y.o. female with PMH CAD and STEMI s/p DES in 2009, HLD, htpothyroidism, COPD, HTN, who presents for evaluation of hematochezia.  We will proceed with colonoscopy.  Harvel Quale, MD 10/16/2021, 8:53 AM

## 2021-10-16 NOTE — Discharge Instructions (Addendum)
You are being discharged to home.  Resume your previous diet.  We are waiting for your pathology results.  Your physician has recommended a repeat colonoscopy for surveillance based on pathology results.  

## 2021-10-16 NOTE — Transfer of Care (Signed)
Immediate Anesthesia Transfer of Care Note  Patient: [name]  Procedure(s) Performed: COLONOSCOPY WITH PROPOFOL POLYPECTOMY  Patient Location: Endoscopy Unit  Anesthesia Type:General  Level of Consciousness: awake  Airway & Oxygen Therapy: Patient Spontanous Breathing  Post-op Assessment: Report given to RN and Post -op Vital signs reviewed and stable  Post vital signs: Reviewed and stable  Last Vitals:  Vitals Value Taken Time  BP    Temp    Pulse    Resp    SpO2      Last Pain:  Vitals:   10/16/21 0924  TempSrc:   PainSc: 0-No pain      Patients Stated Pain Goal: 6 (14/44/58 4835)  Complications: No notable events documented.

## 2021-10-16 NOTE — Op Note (Signed)
[facility] Patient Name: [name] Procedure Date: 10/16/2021 9:14 AM MRN: [id number] Date of Birth: 1961-02-24 Attending MD: Maylon Peppers ,  CSN: [id number] Age: 61 Admit Type: Outpatient Procedure:                Colonoscopy Indications:              Rectal bleeding Providers:                Maylon Peppers, Caprice Kluver, [place] Risa Grill, Technician Referring MD:              Medicines:                Monitored Anesthesia Care Complications:            No immediate complications. Estimated Blood Loss:     Estimated blood loss: none. Procedure:                Pre-Anesthesia Assessment:                           - Prior to the procedure, a History and Physical                            was performed, and patient medications, allergies                            and sensitivities were reviewed. The patient's                            tolerance of previous anesthesia was reviewed.                           - The risks and benefits of the procedure and the                            sedation options and risks were discussed with the                            patient. All questions were answered and informed                            consent was obtained.                           - ASA Grade Assessment: III - A patient with severe                            systemic disease.                           After obtaining informed consent, the colonoscope                            was passed under direct vision. Throughout the  procedure, the patient's blood pressure, pulse, and                            oxygen saturations were monitored continuously. The                            [id number]-HQ190L ([id number]) scope was introduced through                            the anus and advanced to the the terminal ileum.                            The colonoscopy was somewhat difficult due to a                             redundant colon. Successful completion of the                            procedure was aided by applying abdominal pressure                            in periumbilical and RLQ. The patient tolerated the                            procedure well. The quality of the bowel                            preparation was excellent. Scope In: 9:34:04 AM Scope Out: 10:09:44 AM Scope Withdrawal Time: 0 hours 19 minutes 29 seconds  Total Procedure Duration: 0 hours 35 minutes 40 seconds  Findings:      The perianal and digital rectal examinations were normal.      The terminal ileum appeared normal.      A 2 mm polyp was found in the cecum. The polyp was sessile. The polyp       was removed with a cold biopsy forceps. Resection and retrieval were       complete.      Three sessile polyps were found in the descending colon and ascending       colon. The polyps were 4 to 6 mm in size. These polyps were removed with       a cold snare. Resection and retrieval were complete.      A 2 mm polyp was found in the descending colon. The polyp was sessile.       The polyp was removed with a cold biopsy forceps. Resection and       retrieval were complete.      A few small-mouthed diverticula were found in the sigmoid colon.      Non-bleeding internal hemorrhoids were found during retroflexion. The       hemorrhoids were small.      Bleeding likely from diverticular or hemorrhoidal area Impression:               - The examined portion of the ileum was normal.                           -  One 2 mm polyp in the cecum, removed with a cold                            biopsy forceps. Resected and retrieved.                           - Three 4 to 6 mm polyps in the descending colon                            and in the ascending colon, removed with a cold                            snare. Resected and retrieved.                           - One 2 mm polyp in the descending colon, removed                             with a cold biopsy forceps. Resected and retrieved.                           - Diverticulosis in the sigmoid colon.                           - Non-bleeding internal hemorrhoids. Moderate Sedation:      Per Anesthesia Care Recommendation:           - Discharge patient to home (ambulatory).                           - Resume previous diet.                           - Await pathology results.                           - Repeat colonoscopy for surveillance based on                            pathology results. Procedure Code(s):        --- Professional ---                           [phone], Colonoscopy, flexible; with removal of                            tumor(s), polyp(s), or other lesion(s) by snare                            technique                           45380, 59, Colonoscopy, flexible; with biopsy,                            single or multiple Diagnosis Code(s):        ---  Professional ---                           K64.8, Other hemorrhoids                           K63.5, Polyp of colon                           K62.5, Hemorrhage of anus and rectum                           K57.30, Diverticulosis of large intestine without                            perforation or abscess without bleeding CPT copyright 2019 [employer] Association. All rights reserved. The codes documented in this report are preliminary and upon coder review may  be revised to meet current compliance requirements. Maylon Peppers, MD Maylon Peppers,  10/16/2021 10:16:44 AM This report has been signed electronically. Number of Addenda: 0

## 2021-10-16 NOTE — Anesthesia Procedure Notes (Signed)
Date/Time: 10/16/2021 9:29 AM Performed by: Orlie Dakin, CRNA Pre-anesthesia Checklist: Patient identified, Emergency Drugs available, Suction available and Patient being monitored Patient Re-evaluated:Patient Re-evaluated prior to induction Oxygen Delivery Method: Nasal cannula Induction Type: IV induction Placement Confirmation: positive ETCO2

## 2021-10-16 NOTE — Anesthesia Preprocedure Evaluation (Signed)
Anesthesia Evaluation  Patient identified by MRN, date of birth, ID band Patient awake    Reviewed: Allergy & Precautions, H&P , NPO status , Patient's Chart, lab work & pertinent test results, reviewed documented beta blocker date and time   Airway Mallampati: II  TM Distance: >3 FB Neck ROM: full    Dental no notable dental hx.    Pulmonary COPD, former smoker,    Pulmonary exam normal breath sounds clear to auscultation       Cardiovascular Exercise Tolerance: Good hypertension, + CAD and + Past MI   Rhythm:regular Rate:Normal     Neuro/Psych negative neurological ROS  negative psych ROS   GI/Hepatic Neg liver ROS, GERD  Medicated,  Endo/Other  Hypothyroidism   Renal/GU negative Renal ROS  negative genitourinary   Musculoskeletal   Abdominal   Peds  Hematology negative hematology ROS (+)   Anesthesia Other Findings   Reproductive/Obstetrics negative OB ROS                             Anesthesia Physical Anesthesia Plan  ASA: 3  Anesthesia Plan: General   Post-op Pain Management:    Induction:   PONV Risk Score and Plan: Propofol infusion  Airway Management Planned:   Additional Equipment:   Intra-op Plan:   Post-operative Plan:   Informed Consent: I have reviewed the patients History and Physical, chart, labs and discussed the procedure including the risks, benefits and alternatives for the proposed anesthesia with the patient or authorized representative who has indicated his/her understanding and acceptance.     Dental Advisory Given  Plan Discussed with: CRNA  Anesthesia Plan Comments:         Anesthesia Quick Evaluation

## 2021-10-17 LAB — SURGICAL PATHOLOGY

## 2021-10-18 ENCOUNTER — Encounter (HOSPITAL_COMMUNITY): Payer: Self-pay | Admitting: Gastroenterology

## 2021-11-06 ENCOUNTER — Telehealth: Payer: Medicare Other

## 2021-11-12 ENCOUNTER — Other Ambulatory Visit: Payer: Self-pay

## 2021-11-12 ENCOUNTER — Ambulatory Visit (INDEPENDENT_AMBULATORY_CARE_PROVIDER_SITE_OTHER): Payer: Medicare Other

## 2021-11-12 VITALS — Ht 62.0 in | Wt 140.0 lb

## 2021-11-12 DIAGNOSIS — Z0000 Encounter for general adult medical examination without abnormal findings: Secondary | ICD-10-CM

## 2021-11-12 NOTE — Progress Notes (Signed)
Subjective:   [name] is a 61 y.o. female who presents for an Initial Medicare Annual Wellness Visit. Virtual Visit via Telephone Note  I connected with  [name] on 11/12/21 at  9:40 AM EST by telephone and verified that I am speaking with the correct person using two identifiers.  Location: Patient: HOME Provider: [facility] Persons participating in the virtual visit: patient/Nurse Health Advisor   I discussed the limitations, risks, security and privacy concerns of performing an evaluation and management service by telephone and the availability of in person appointments. The patient expressed understanding and agreed to proceed.  Interactive audio and video telecommunications were attempted between this nurse and patient, however failed, due to patient having technical difficulties OR patient did not have access to video capability.  We continued and completed visit with audio only.  Some vital signs may be absent or patient reported.   Chriss Driver, LPN  Review of Systems     Cardiac Risk Factors include: hypertension;dyslipidemia;sedentary lifestyle;Other (see comment), Risk factor comments: COPD     Objective:    Today's Vitals   11/12/21 0934  Weight: 140 lb (63.5 kg)  Height: 5\' 2"  (1.575 m)   Body mass index is 25.61 kg/m.  Advanced Directives 11/12/2021 10/16/2021 08/28/2021 06/21/2018 05/26/2018  Does Patient Have a Medical Advance Directive? No No No No No  Would patient like information on creating a medical advance directive? No - Patient declined No - Patient declined Yes (MAU/Ambulatory/Procedural Areas - Information given) No - Patient declined No - Patient declined    Current Medications (verified) Outpatient Encounter Medications as of 11/12/2021  Medication Sig   albuterol (PROVENTIL) (2.5 MG/3ML) 0.083% nebulizer solution USE 1 VIAL IN NEBULIZER EVERY 4 HOURS AS NEEDED FOR SHORTNESS OF BREATH   albuterol (VENTOLIN HFA) 108 (90 Base)  MCG/ACT inhaler Inhale 1 puff into the lungs every 6 (six) hours as needed for shortness of breath.   aspirin 81 MG EC tablet Take 81 mg by mouth at bedtime.   Cholecalciferol (VITAMIN D3) 400 units CAPS Take 400 Units by mouth at bedtime.    Fluticasone-Umeclidin-Vilant (TRELEGY ELLIPTA) 100-62.5-25 MCG/ACT AEPB INHALE 1 PUFF BY MOUTH ONCE DAILY *RINSE MOUTH AFTER USE*   levothyroxine (SYNTHROID) 125 MCG tablet 1 qam all days except Mondays and fridays  use 1/2 in the am. (Patient taking differently: Take 125 mcg by mouth See admin instructions. 125 mg am all days except Mondays and fridays take 62.5 mg in the am.)   metoprolol tartrate (LOPRESSOR) 25 MG tablet Take 0.5 tablets (12.5 mg total) by mouth 2 (two) times daily. (Patient taking differently: Take 12.5-25 mg by mouth See admin instructions. Take 25 mg every day EXCEPT: Monday and Friday take 12.5 mg)   Multiple Vitamin (MULTIVITAMIN) tablet Take 1 tablet by mouth daily.   nitroGLYCERIN (NITROSTAT) 0.4 MG SL tablet Place 1 tablet (0.4 mg total) under the tongue every 5 (five) minutes x 3 doses as needed. (Patient taking differently: Place 0.4 mg under the tongue every 5 (five) minutes x 3 doses as needed for chest pain.)   pantoprazole (PROTONIX) 40 MG tablet Take 1 tablet (40 mg total) by mouth daily.   rosuvastatin (CRESTOR) 20 MG tablet Take 1 tablet (20 mg total) by mouth daily.   vitamin C (ASCORBIC ACID) 500 MG tablet Take 500 mg by mouth daily.   No facility-administered encounter medications on file as of 11/12/2021.    Allergies (verified) Patient has no known  allergies.   History: Past Medical History:  Diagnosis Date   COPD (chronic obstructive pulmonary disease) ([place])    Coronary atherosclerosis of native coronary artery    DES RCA 5/09, nonobstructive left system   Essential hypertension    Hypothyroidism    Mixed hyperlipidemia    NSTEMI (non-ST elevated myocardial infarction) ([place])    2009   Past Surgical History:   Procedure Laterality Date   BREAST LUMPECTOMY     COLONOSCOPY N/A 05/26/2018   Procedure: COLONOSCOPY;  Surgeon: Rogene Houston, MD;  [facility];  Service: Endoscopy;  Laterality: N/A;  1030   COLONOSCOPY WITH PROPOFOL N/A 10/16/2021   Procedure: COLONOSCOPY WITH PROPOFOL;  Surgeon: Harvel Quale, MD;  [facility];  Service: Gastroenterology;  Laterality: N/A;  10:05   POLYPECTOMY  05/26/2018   Procedure: POLYPECTOMY;  Surgeon: Rogene Houston, MD;  [facility];  Service: Endoscopy;;  colon   POLYPECTOMY  10/16/2021   Procedure: POLYPECTOMY;  Surgeon: Harvel Quale, MD;  [facility];  Service: Gastroenterology;;   TOTAL ABDOMINAL HYSTERECTOMY     Family History  Problem Relation Age of Onset   Pneumonia Brother        age 5   Cancer Brother        larynx - age 107   Pneumonia Brother        declining health - age 20   Social History   Socioeconomic History   Marital status: Married    Spouse name: Not on file   Number of children: Not on file   Years of education: Not on file   Highest education level: Not on file  Occupational History   Not on file  Tobacco Use   Smoking status: Former    Packs/day: 2.00    Years: 33.00    Pack years: 66.00    Types: Cigarettes    Start date: 10/27/1972    Quit date: 2007    Years since quitting: 16.1   Smokeless tobacco: Never  Vaping Use   Vaping Use: Never used  Substance and Sexual Activity   Alcohol use: No    Alcohol/week: 0.0 standard drinks   Drug use: No   Sexual activity: Not Currently  Other Topics Concern   Not on file  Social History Narrative   Full time. Married. Regularly exercises    Social Determinants of Health   Financial Resource Strain: Low Risk    Difficulty of Paying Living Expenses: Not very hard  Food Insecurity: No Food Insecurity   Worried About Running Out of Food in the Last Year: Never true   Ran Out of Food in the Last  Year: Never true  Transportation Needs: No Transportation Needs   Lack of Transportation (Medical): No   Lack of Transportation (Non-Medical): No  Physical Activity: Insufficiently Active   Days of Exercise per Week: 4 days   Minutes of Exercise per Session: 30 min  Stress: No Stress Concern Present   Feeling of Stress : Not at all  Social Connections: Socially Integrated   Frequency of Communication with Friends and Family: More than three times a week   Frequency of Social Gatherings with Friends and Family: More than three times a week   Attends Religious Services: 1 to 4 times per year   Active Member of [employer] or Organizations: Yes   Attends Archivist Meetings: 1 to 4 times per year   Marital Status: Married  Tobacco Counseling Counseling given: Not Answered   Clinical Intake:  Pre-visit preparation completed: Yes  Pain : No/denies pain     BMI - recorded: 25.61 Nutritional Status: BMI 25 -29 Overweight Nutritional Risks: None Diabetes: No  How often do you need to have someone help you when you read instructions, pamphlets, or other written materials from your doctor or pharmacy?: 1 - Never  Diabetic?No  Interpreter Needed?: No  Information entered by :: mj Mystie Ormand, lpn   Activities of Daily Living In your present state of health, do you have any difficulty performing the following activities: 11/12/2021  Hearing? N  Vision? N  Difficulty concentrating or making decisions? N  Walking or climbing stairs? N  Dressing or bathing? N  Doing errands, shopping? N  Preparing Food and eating ? N  Using the Toilet? N  In the past six months, have you accidently leaked urine? N  Do you have problems with loss of bowel control? N  Managing your Medications? N  Managing your Finances? N  Housekeeping or managing your Housekeeping? N  Some recent data might be hidden    Patient Care Team: Kathyrn Drown, MD as PCP - General (Family  Medicine) Satira Sark, MD as PCP - Cardiology (Cardiology) Gari Crown, MD as Obstetrician (Unknown Physician Specialty) Kassie Mends, RN as Case Manager  Indicate any recent Medical Services you may have received from other than [facility] providers in the past year (date may be approximate).     Assessment:   This is a routine wellness examination for Merrill.  Hearing/Vision screen Hearing Screening - Comments:: No hearing issues. Vision Screening - Comments:: Glasses. 08/2021. [facility].   Dietary issues and exercise activities discussed: Current Exercise Habits: Home exercise routine, Type of exercise: walking, Time (Minutes): 30, Frequency (Times/Week): 4, Weekly Exercise (Minutes/Week): 120, Intensity: Mild, Exercise limited by: cardiac condition(s);respiratory conditions(s)   Goals Addressed             This Visit's Progress    Exercise 3x per week (30 min per time)         Depression Screen PHQ 2/9 Scores 11/12/2021 08/28/2021 08/28/2021 01/21/2021 02/13/2020 01/18/2018 07/21/2016  PHQ - 2 Score 0 0 0 0 0 0 0  PHQ- 9 Score - - - - 0 - -    Fall Risk Fall Risk  11/12/2021 08/28/2021 01/21/2021 07/21/2016 01/26/2015  Falls in the past year? 0 0 0 No No  Number falls in past yr: 0 - - - -  Injury with Fall? 0 - - - -  Risk for fall due to : No Fall Risks - - - -  Follow up Falls prevention discussed - Falls evaluation completed - -    FALL RISK PREVENTION PERTAINING TO THE HOME:  Any stairs in or around the home? Yes  If so, are there any without handrails? No  Home free of loose throw rugs in walkways, pet beds, electrical cords, etc? Yes  Adequate lighting in your home to reduce risk of falls? Yes   ASSISTIVE DEVICES UTILIZED TO PREVENT FALLS:  Life alert? No  Use of a cane, walker or w/c? No  Grab bars in the bathroom? No  Shower chair or bench in shower? Yes  Elevated toilet seat or a handicapped toilet? No   TIMED UP AND GO:  Was the test  performed? No .  Phone visit.   Cognitive Function:     6CIT Screen 11/12/2021  What Year? 0  points  What month? 0 points  What time? 0 points  Count back from 20 0 points  Months in reverse 0 points  Repeat phrase 0 points  Total Score 0    Immunizations Immunization History  Administered Date(s) Administered   Influenza,inj,Quad PF,6+ Mos 07/27/2014, 07/09/2015, 07/21/2016, 07/21/2018, 07/23/2020, 08/06/2021   Influenza,inj,quad, With Preservative 08/17/2017, 06/14/2019   Influenza-Unspecified 08/17/2017, 06/14/2019   Moderna SARS-COV2 Booster Vaccination 07/11/2021   Moderna Sars-Covid-2 Vaccination 12/14/2019, 01/09/2020   Pneumococcal Conjugate-13 08/28/2015   Pneumococcal Polysaccharide-23 08/23/2014   Td 06/10/2018, 05/14/2021    TDAP status: Up to date  Flu Vaccine status: Up to date  Pneumococcal vaccine status: Up to date  Covid-19 vaccine status: Completed vaccines  Qualifies for Shingles Vaccine? Yes   Zostavax completed No   Shingrix Completed?: No.    Education has been provided regarding the importance of this vaccine. Patient has been advised to call insurance company to determine out of pocket expense if they have not yet received this vaccine. Advised may also receive vaccine at local pharmacy or Health Dept. Verbalized acceptance and understanding.  Screening Tests Health Maintenance  Topic Date Due   Zoster Vaccines- Shingrix (1 of 2) Never done   COVID-19 Vaccine (3 - Moderna risk series) 08/08/2021   MAMMOGRAM  08/21/2022   COLONOSCOPY (Pts 45-48yrs Insurance coverage will need to be confirmed)  10/16/2024   TETANUS/TDAP  05/15/2031   INFLUENZA VACCINE  Completed   Hepatitis C Screening  Completed   HIV Screening  Completed   HPV VACCINES  Aged Out    Health Maintenance  Health Maintenance Due  Topic Date Due   Zoster Vaccines- Shingrix (1 of 2) Never done   COVID-19 Vaccine (3 - Moderna risk series) 08/08/2021    Colorectal cancer  screening: Type of screening: Colonoscopy. Completed 10/16/2021. Repeat every 3 years  Mammogram status: Completed 08/2021 [facility] pt at [facility] per pt. Repeat every year  Bone Density status: Ordered No due until age 80. Pt provided with contact info and advised to call to schedule appt.  Lung Cancer Screening: (Low Dose CT Chest recommended if Age 37-80 years, 30 pack-year currently smoking OR have quit w/in 15years.) does qualify.   Lung Cancer Screening Referral: Done 11/15/2020.  Additional Screening:  Hepatitis C Screening: does qualify; Completed 07/22/2017  Vision Screening: Recommended annual ophthalmology exams for early detection of glaucoma and other disorders of the eye. Is the patient up to date with their annual eye exam?  Yes  Who is the provider or what is the name of the office in which the patient attends annual eye exams? [facility]. If pt is not established with a provider, would they like to be referred to a provider to establish care? No .   Dental Screening: Recommended annual dental exams for proper oral hygiene  Community Resource Referral / Chronic Care Management: CRR required this visit?  No   CCM required this visit?  No      Plan:     I have personally reviewed and noted the following in the patients chart:   Medical and social history Use of alcohol, tobacco or illicit drugs  Current medications and supplements including opioid prescriptions. Patient is not currently taking opioid prescriptions. Functional ability and status Nutritional status Physical activity Advanced directives List of other physicians Hospitalizations, surgeries, and ER visits in previous 12 months Vitals Screenings to include cognitive, depression, and falls Referrals and appointments  In addition, I have reviewed  and discussed with patient certain preventive protocols, quality metrics, and best practice recommendations. A written personalized care plan  for preventive services as well as general preventive health recommendations were provided to patient.     Chriss Driver, LPN   10/16/2477   Nurse Notes: Pt is up to date on all health maintenance. Pt states she had her mammogram done at [facility] in [place], 08/2021. Discussed Shingrix and how to obtain.

## 2021-11-12 NOTE — Patient Instructions (Signed)
[name] , Thank you for taking time to come for your Medicare Wellness Visit. I appreciate your ongoing commitment to your health goals. Please review the following plan we discussed and let me know if I can assist you in the future.   Screening recommendations/referrals: Colonoscopy: Done 10/16/2021 Repeat every 3 years.  Mammogram: Done 08/2021 at [facility] Bone Density: Due at age 61.  Recommended yearly ophthalmology/optometry visit for glaucoma screening and checkup Recommended yearly dental visit for hygiene and checkup  Vaccinations: Influenza vaccine: Done 08/06/2021. Repeat annually  Pneumococcal vaccine: Done 08/23/2014 and 08/28/2015 Tdap vaccine: Done 05/14/2021 Repeat in 10 years  Shingles vaccine: Discussed. Obtain vaccine at local pharmacy.  Covid-19: Done 12/14/2019, 12/30/2019, 07/11/2021  Advanced directives: Advance directive discussed with you today. I have provided a copy for you to complete at home and have notarized. Once this is complete please bring a copy in to our office so we can scan it into your chart. Mailed out today.   Conditions/risks identified: Aim for 30 minutes of exercise or brisk walking each day, drink 6-8 glasses of water and eat lots of fruits and vegetables.   Next appointment: Follow up in one year for your annual wellness visit. 2024.  Preventive Care 40-64 Years, Female Preventive care refers to lifestyle choices and visits with your health care provider that can promote health and wellness. What does preventive care include? A yearly physical exam. This is also called an annual well check. Dental exams once or twice a year. Routine eye exams. Ask your health care provider how often you should have your eyes checked. Personal lifestyle choices, including: Daily care of your teeth and gums. Regular physical activity. Eating a healthy diet. Avoiding tobacco and drug use. Limiting alcohol use. Practicing safe sex. Taking  low-dose aspirin daily starting at age 72. Taking vitamin and mineral supplements as recommended by your health care provider. What happens during an annual well check? The services and screenings done by your health care provider during your annual well check will depend on your age, overall health, lifestyle risk factors, and family history of disease. Counseling  Your health care provider may ask you questions about your: Alcohol use. Tobacco use. Drug use. Emotional well-being. Home and relationship well-being. Sexual activity. Eating habits. Work and work Statistician. Method of birth control. Menstrual cycle. Pregnancy history. Screening  You may have the following tests or measurements: Height, weight, and BMI. Blood pressure. Lipid and cholesterol levels. These may be checked every 5 years, or more frequently if you are over 64 years old. Skin check. Lung cancer screening. You may have this screening every year starting at age 78 if you have a 30-pack-year history of smoking and currently smoke or have quit within the past 15 years. Fecal occult blood test (FOBT) of the stool. You may have this test every year starting at age 20. Flexible sigmoidoscopy or colonoscopy. You may have a sigmoidoscopy every 5 years or a colonoscopy every 10 years starting at age 21. Hepatitis C blood test. Hepatitis B blood test. Sexually transmitted disease (STD) testing. Diabetes screening. This is done by checking your blood sugar (glucose) after you have not eaten for a while (fasting). You may have this done every 1-3 years. Mammogram. This may be done every 1-2 years. Talk to your health care provider about when you should start having regular mammograms. This may depend on whether you have a family history of breast cancer. BRCA-related cancer screening. This may be done  if you have a family history of breast, ovarian, tubal, or peritoneal cancers. Pelvic exam and Pap test. This may be done  every 3 years starting at age 14. Starting at age 34, this may be done every 5 years if you have a Pap test in combination with an HPV test. Bone density scan. This is done to screen for osteoporosis. You may have this scan if you are at high risk for osteoporosis. Discuss your test results, treatment options, and if necessary, the need for more tests with your health care provider. Vaccines  Your health care provider may recommend certain vaccines, such as: Influenza vaccine. This is recommended every year. Tetanus, diphtheria, and acellular pertussis (Tdap, Td) vaccine. You may need a Td booster every 10 years. Zoster vaccine. You may need this after age 78. Pneumococcal 13-valent conjugate (PCV13) vaccine. You may need this if you have certain conditions and were not previously vaccinated. Pneumococcal polysaccharide (PPSV23) vaccine. You may need one or two doses if you smoke cigarettes or if you have certain conditions. Talk to your health care provider about which screenings and vaccines you need and how often you need them. This information is not intended to replace advice given to you by your health care provider. Make sure you discuss any questions you have with your health care provider. Document Released: 10/05/2015 Document Revised: 05/28/2016 Document Reviewed: 07/10/2015 Elsevier Interactive Patient Education  2017 [place] Prevention in the Home Falls can cause injuries. They can happen to people of all ages. There are many things you can do to make your home safe and to help prevent falls. What can I do on the outside of my home? Regularly fix the edges of walkways and driveways and fix any cracks. Remove anything that might make you trip as you walk through a door, such as a raised step or threshold. Trim any bushes or trees on the path to your home. Use bright outdoor lighting. Clear any walking paths of anything that might make someone trip, such as rocks or  tools. Regularly check to see if handrails are loose or broken. Make sure that both sides of any steps have handrails. Any raised decks and porches should have guardrails on the edges. Have any leaves, snow, or ice cleared regularly. Use sand or salt on walking paths during winter. Clean up any spills in your garage right away. This includes oil or grease spills. What can I do in the bathroom? Use night lights. Install grab bars by the toilet and in the tub and shower. Do not use towel bars as grab bars. Use non-skid mats or decals in the tub or shower. If you need to sit down in the shower, use a plastic, non-slip stool. Keep the floor dry. Clean up any water that spills on the floor as soon as it happens. Remove soap buildup in the tub or shower regularly. Attach bath mats securely with double-sided non-slip rug tape. Do not have throw rugs and other things on the floor that can make you trip. What can I do in the bedroom? Use night lights. Make sure that you have a light by your bed that is easy to reach. Do not use any sheets or blankets that are too big for your bed. They should not hang down onto the floor. Have a firm chair that has side arms. You can use this for support while you get dressed. Do not have throw rugs and other things on the  floor that can make you trip. What can I do in the kitchen? Clean up any spills right away. Avoid walking on wet floors. Keep items that you use a lot in easy-to-reach places. If you need to reach something above you, use a strong step stool that has a grab bar. Keep electrical cords out of the way. Do not use floor polish or wax that makes floors slippery. If you must use wax, use non-skid floor wax. Do not have throw rugs and other things on the floor that can make you trip. What can I do with my stairs? Do not leave any items on the stairs. Make sure that there are handrails on both sides of the stairs and use them. Fix handrails that are  broken or loose. Make sure that handrails are as long as the stairways. Check any carpeting to make sure that it is firmly attached to the stairs. Fix any carpet that is loose or worn. Avoid having throw rugs at the top or bottom of the stairs. If you do have throw rugs, attach them to the floor with carpet tape. Make sure that you have a light switch at the top of the stairs and the bottom of the stairs. If you do not have them, ask someone to add them for you. What else can I do to help prevent falls? Wear shoes that: Do not have high heels. Have rubber bottoms. Are comfortable and fit you well. Are closed at the toe. Do not wear sandals. If you use a stepladder: Make sure that it is fully opened. Do not climb a closed stepladder. Make sure that both sides of the stepladder are locked into place. Ask someone to hold it for you, if possible. Clearly mark and make sure that you can see: Any grab bars or handrails. First and last steps. Where the edge of each step is. Use tools that help you move around (mobility aids) if they are needed. These include: Canes. Walkers. Scooters. Crutches. Turn on the lights when you go into a dark area. Replace any light bulbs as soon as they burn out. Set up your furniture so you have a clear path. Avoid moving your furniture around. If any of your floors are uneven, fix them. If there are any pets around you, be aware of where they are. Review your medicines with your doctor. Some medicines can make you feel dizzy. This can increase your chance of falling. Ask your doctor what other things that you can do to help prevent falls. This information is not intended to replace advice given to you by your health care provider. Make sure you discuss any questions you have with your health care provider. Document Released: 07/05/2009 Document Revised: 02/14/2016 Document Reviewed: 10/13/2014 Elsevier Interactive Patient Education  2017 [employer].

## 2021-12-16 ENCOUNTER — Telehealth: Payer: Self-pay | Admitting: *Deleted

## 2021-12-16 NOTE — Telephone Encounter (Signed)
Left message for pt to call back to schedule f/u lung screening CT .  ?

## 2021-12-23 ENCOUNTER — Other Ambulatory Visit: Payer: Self-pay | Admitting: *Deleted

## 2021-12-23 DIAGNOSIS — Z87891 Personal history of nicotine dependence: Secondary | ICD-10-CM

## 2021-12-24 ENCOUNTER — Ambulatory Visit: Payer: Medicare Other | Admitting: Cardiology

## 2021-12-24 ENCOUNTER — Encounter: Payer: Self-pay | Admitting: Cardiology

## 2021-12-24 VITALS — BP 120/72 | HR 67 | Ht 62.0 in | Wt 149.4 lb

## 2021-12-24 DIAGNOSIS — I10 Essential (primary) hypertension: Secondary | ICD-10-CM | POA: Diagnosis not present

## 2021-12-24 DIAGNOSIS — E782 Mixed hyperlipidemia: Secondary | ICD-10-CM

## 2021-12-24 DIAGNOSIS — I25119 Atherosclerotic heart disease of native coronary artery with unspecified angina pectoris: Secondary | ICD-10-CM

## 2021-12-24 NOTE — Patient Instructions (Addendum)

## 2021-12-24 NOTE — Progress Notes (Signed)
? ? ?Cardiology Office Note ? ?Date: 12/24/2021  ? ?ID: [name], DOB 20-May-1961, MRN [id number] ? ?PCP:  Kelli Drown, MD  ?Cardiologist:  Kelli Lesches, MD ?Electrophysiologist:  None  ? ?Chief Complaint  ?Patient presents with  ? Cardiac follow-up  ? ? ?History of Present Illness: ?[name] is a 61 y.o. female last assessed via telehealth encounter in February 2021.  She is here overdue for follow-up.  Fortunately, doing very well from a cardiac perspective without any significant angina symptoms or nitroglycerin use. ? ?I reviewed her medications which are noted below.  She has pending lab work with Dr. Wolfgang Myers including follow-up of lipids.  Her last LDL was 94.  She states that she has been compliant with Crestor 20 mg daily. ? ?I personally reviewed her ECG today which shows normal sinus rhythm. ? ?Past Medical History:  ?Diagnosis Date  ? COPD (chronic obstructive pulmonary disease) ([place])   ? Coronary atherosclerosis of native coronary artery   ? DES RCA 5/09, nonobstructive left system  ? Essential hypertension   ? Hypothyroidism   ? Mixed hyperlipidemia   ? NSTEMI (non-ST elevated myocardial infarction) [facility])   ? 2009  ? ? ?Past Surgical History:  ?Procedure Laterality Date  ? BREAST LUMPECTOMY    ? COLONOSCOPY N/A 05/26/2018  ? Procedure: COLONOSCOPY;  Surgeon: Rogene Houston, MD;  Location: [facility];  Service: Endoscopy;  Laterality: N/A;  1030  ? COLONOSCOPY WITH PROPOFOL N/A 10/16/2021  ? Procedure: COLONOSCOPY WITH PROPOFOL;  Surgeon: Harvel Quale, MD;  Location: [facility];  Service: Gastroenterology;  Laterality: N/A;  10:05  ? POLYPECTOMY  05/26/2018  ? Procedure: POLYPECTOMY;  Surgeon: Rogene Houston, MD;  Location: [facility];  Service: Endoscopy;;  colon  ? POLYPECTOMY  10/16/2021  ? Procedure: POLYPECTOMY;  Surgeon: Montez Morita, Quillian Quince, MD;  Location: [facility];  Service: Gastroenterology;;  ? TOTAL ABDOMINAL HYSTERECTOMY    ? ? ?Current  Outpatient Medications  ?Medication Sig Dispense Refill  ? albuterol (PROVENTIL) (2.5 MG/3ML) 0.083% nebulizer solution USE 1 VIAL IN NEBULIZER EVERY 4 HOURS AS NEEDED FOR SHORTNESS OF BREATH 360 mL 5  ? albuterol (VENTOLIN HFA) 108 (90 Base) MCG/ACT inhaler Inhale 1 puff into the lungs every 6 (six) hours as needed for shortness of breath.    ? aspirin 81 MG EC tablet Take 81 mg by mouth at bedtime.    ? Cholecalciferol (VITAMIN D3) 400 units CAPS Take 400 Units by mouth at bedtime.     ? Fluticasone-Umeclidin-Vilant (TRELEGY ELLIPTA) 100-62.5-25 MCG/ACT AEPB INHALE 1 PUFF BY MOUTH ONCE DAILY *RINSE MOUTH AFTER USE* 60 each 3  ? levothyroxine (SYNTHROID) 125 MCG tablet 1 qam all days except Mondays and fridays  use 1/2 in the am. 90 tablet 1  ? metoprolol tartrate (LOPRESSOR) 25 MG tablet Take 0.5 tablets (12.5 mg total) by mouth 2 (two) times daily. 90 tablet 1  ? Multiple Vitamin (MULTIVITAMIN) tablet Take 1 tablet by mouth daily.    ? nitroGLYCERIN (NITROSTAT) 0.4 MG SL tablet Place 1 tablet (0.4 mg total) under the tongue every 5 (five) minutes x 3 doses as needed. 25 tablet 3  ? pantoprazole (PROTONIX) 40 MG tablet Take 1 tablet (40 mg total) by mouth daily. 90 tablet 1  ? rosuvastatin (CRESTOR) 20 MG tablet Take 1 tablet (20 mg total) by mouth daily. 90 tablet 3  ? vitamin C (ASCORBIC ACID) 500 MG tablet Take 500 mg by mouth  daily.    ? ?No current facility-administered medications for this visit.  ? ?Allergies:  Patient has no known allergies.  ? ?ROS: No palpitations or syncope. ? ?Physical Exam: ?VS:  BP 120/72   Pulse 67   Ht '5\' 2"'$  (1.575 m)   Wt 149 lb 6.4 oz (67.8 kg)   SpO2 92%   BMI 27.33 kg/m? , BMI Body mass index is 27.33 kg/m?. ? ?Wt Readings from Last 3 Encounters:  ?12/24/21 149 lb 6.4 oz (67.8 kg)  ?11/12/21 140 lb (63.5 kg)  ?10/16/21 140 lb (63.5 kg)  ?  ?General: Patient appears comfortable at rest. ?HEENT: Conjunctiva and lids normal. ?Neck: Supple, no elevated JVP or carotid bruits,  no thyromegaly. ?Lungs: Clear to auscultation, nonlabored breathing at rest. ?Cardiac: Regular rate and rhythm, no S3 or significant systolic murmur. ?Extremities: No pitting edema. ? ?ECG:  An ECG dated 06/21/2018 was personally reviewed today and demonstrated:  Sinus rhythm with nonspecific ST changes. ? ?Recent Labwork: ?04/30/2021: ALT 14; AST 21; BUN 11; Creatinine, Ser 0.79; Hemoglobin 14.3; Platelets 269; Potassium 4.6; Sodium 140 ?07/29/2021: TSH 0.462  ?   ?Component Value Date/Time  ? CHOL 170 07/29/2021 0847  ? TRIG 154 (H) 07/29/2021 0847  ? HDL 49 07/29/2021 0847  ? CHOLHDL 3.5 07/29/2021 0847  ? CHOLHDL 2.8 03/01/2020 0730  ? VLDL 22 01/20/2017 0724  ? [place] 94 07/29/2021 0847  ? [place]mer[place] 83 03/01/2020 0730  ? ? ?Other Studies Reviewed Today: ? ?Lexiscan Myoview 12/15/2017: ?The study is normal. No myocardial ischemia or scar. ?Nuclear stress EF: 62%. ?This is a low risk study. ? ?Chest CT 11/15/2020: ?IMPRESSION: ?1. Lung-RADS 2S, benign appearance or behavior. Continue annual ?screening with low-dose chest CT without contrast in 12 months. ?2. The "S" modifier above refers to potentially clinically ?significant non lung cancer related findings. Specifically, there is ?aortic atherosclerosis, in addition to left main and 3 vessel ?coronary artery disease. Please note that although the presence of ?coronary artery calcium documents the presence of coronary artery ?disease, the severity of this disease and any potential stenosis ?cannot be assessed on this non-gated CT examination. Assessment for ?potential risk factor modification, dietary therapy or pharmacologic ?therapy may be warranted, if clinically indicated. ?3. Mild diffuse bronchial wall thickening with mild centrilobular ?and paraseptal emphysema; imaging findings suggestive of underlying ?COPD. ?  ?Aortic Atherosclerosis (ICD10-I70.0) and Emphysema (ICD10-J43.9). ? ?Assessment and Plan: ? ?1.  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  She  continues to do very well without active angina on medical therapy.  ECG is normal today.  No clear indication for follow-up ischemic testing at this time.  She is on aspirin, Lopressor, and Crestor.  As needed nitroglycerin available. ? ?2.  Mixed hyperlipidemia, LDL was 94 in November 2022.  She is on Crestor 20 mg daily with follow-up lab work pending per PCP.  If numbers are in similar range suggest increasing Crestor to 40 mg daily if tolerated. ? ?3.  Essential hypertension, blood pressure is normal today on current regimen. ? ?Medication Adjustments/Labs and Tests Ordered: ?Current medicines are reviewed at length with the patient today.  Concerns regarding medicines are outlined above.  ? ?Tests Ordered: ?Orders Placed This Encounter  ?Procedures  ? EKG 12-Lead  ? ? ?Medication Changes: ?No orders of the defined types were placed in this encounter. ? ? ?Disposition:  Follow up  1 year. ? ?Signed, ?Satira Sark, MD, [facility] ?12/24/2021 4:51 PM    ?[facility] ?[place], [place], [place] [address] ?Phone: [phone]; Fax: [phone]  ?

## 2022-01-01 ENCOUNTER — Other Ambulatory Visit: Payer: Self-pay | Admitting: Family Medicine

## 2022-01-01 DIAGNOSIS — E782 Mixed hyperlipidemia: Secondary | ICD-10-CM | POA: Diagnosis not present

## 2022-01-01 DIAGNOSIS — E038 Other specified hypothyroidism: Secondary | ICD-10-CM | POA: Diagnosis not present

## 2022-01-02 LAB — COMPREHENSIVE METABOLIC PANEL
ALT: 14 IU/L (ref 0–32)
AST: 17 IU/L (ref 0–40)
Albumin/Globulin Ratio: 1.9 (ref 1.2–2.2)
Albumin: 4.2 g/dL (ref 3.8–4.8)
Alkaline Phosphatase: 67 IU/L (ref 44–121)
BUN/Creatinine Ratio: 14 (ref 12–28)
BUN: 11 mg/dL (ref 8–27)
Bilirubin Total: 0.3 mg/dL (ref 0.0–1.2)
CO2: 26 mmol/L (ref 20–29)
Calcium: 9.1 mg/dL (ref 8.7–10.3)
Chloride: 109 mmol/L — ABNORMAL HIGH (ref 96–106)
Creatinine, Ser: 0.79 mg/dL (ref 0.57–1.00)
Globulin, Total: 2.2 g/dL (ref 1.5–4.5)
Glucose: 96 mg/dL (ref 70–99)
Potassium: 4.3 mmol/L (ref 3.5–5.2)
Sodium: 148 mmol/L — ABNORMAL HIGH (ref 134–144)
Total Protein: 6.4 g/dL (ref 6.0–8.5)
eGFR: 84 mL/min/{1.73_m2} (ref >59)

## 2022-01-02 LAB — TSH+FREE T4
Free T4: 1.7 ng/dL (ref 0.82–1.77)
TSH: 2.03 u[IU]/mL (ref 0.450–4.500)

## 2022-01-02 LAB — LIPID PANEL WITH LDL/HDL RATIO
Cholesterol, Total: 147 mg/dL (ref 100–199)
HDL: 57 mg/dL (ref >39)
LDL Chol Calc (NIH): 70 mg/dL (ref 0–99)
LDL/HDL Ratio: 1.2 ratio (ref 0.0–3.2)
Triglycerides: 114 mg/dL (ref 0–149)
VLDL Cholesterol Cal: 20 mg/dL (ref 5–40)

## 2022-01-06 ENCOUNTER — Ambulatory Visit (INDEPENDENT_AMBULATORY_CARE_PROVIDER_SITE_OTHER): Payer: Medicare Other | Admitting: Family Medicine

## 2022-01-06 VITALS — BP 114/64 | HR 68 | Temp 97.7°F | Ht 62.0 in | Wt 150.0 lb

## 2022-01-06 DIAGNOSIS — I10 Essential (primary) hypertension: Secondary | ICD-10-CM | POA: Diagnosis not present

## 2022-01-06 DIAGNOSIS — J449 Chronic obstructive pulmonary disease, unspecified: Secondary | ICD-10-CM

## 2022-01-06 DIAGNOSIS — E782 Mixed hyperlipidemia: Secondary | ICD-10-CM

## 2022-01-06 DIAGNOSIS — I2510 Atherosclerotic heart disease of native coronary artery without angina pectoris: Secondary | ICD-10-CM

## 2022-01-06 DIAGNOSIS — E038 Other specified hypothyroidism: Secondary | ICD-10-CM

## 2022-01-06 DIAGNOSIS — D692 Other nonthrombocytopenic purpura: Secondary | ICD-10-CM

## 2022-01-06 MED ORDER — PANTOPRAZOLE SODIUM 40 MG PO TBEC: 40.0000 mg | DELAYED_RELEASE_TABLET | Freq: Every day | ORAL | 1 refills | Status: DC

## 2022-01-06 MED ORDER — LEVOTHYROXINE SODIUM 125 MCG PO TABS: ORAL_TABLET | ORAL | 1 refills | Status: DC

## 2022-01-06 MED ORDER — METOPROLOL TARTRATE 25 MG PO TABS: 12.5000 mg | ORAL_TABLET | Freq: Two times a day (BID) | ORAL | 1 refills | Status: DC

## 2022-01-06 NOTE — Patient Instructions (Addendum)
Shingrix and shingles prevention: know the facts! ? ? ?Shingrix is a very effective vaccine to prevent shingles.   ?Shingles is a reactivation of chickenpox -more than 99% of Americans born before 1980 have had chickenpox even if they do not remember it. ?One in every 10 people who get shingles have severe long-lasting nerve pain as a result.   ?33 out of a 100 older adults will get shingles if they are unvaccinated.   ? ? ?This vaccine is very important for your health ?This vaccine is indicated for anyone 50 years or older. ?You can get this vaccine even if you have already had shingles because you can get the disease more than once in a lifetime. ? ?Your risk for shingles and its complications increases with age. ? ?This vaccine has 2 doses.  The second dose would be 2 to 6 months after the first dose.  ?If you had Zostavax vaccine in the past you should still get Shingrix. ?( Zostavax is only 70% effective and it loses significant strength over a few years .) ? ?This vaccine is given through the pharmacy.  The cost of the vaccine is through your insurance. ?The pharmacy can inform you of the total costs. ? ?Common side effects including soreness in the arm, some redness and swelling, also some feel fatigue muscle soreness headache low-grade fever.  Side effects typically go away within 2 to 3 days. ?Remember-the pain from shingles can last a lifetime but these side effects of the vaccine will only last a few days at most. ?It is very important to get both doses in order to protect yourself fully.  ? ?Please get this vaccine at your earliest convenience at your trusted pharmacy. ? ?Results for orders placed or performed in visit on 01/01/22  ?TSH + free T4  ?Result Value Ref Range  ? TSH 2.030 0.450 - 4.500 uIU/mL  ? Free T4 1.70 0.82 - 1.77 ng/dL  ?Comprehensive metabolic panel  ?Result Value Ref Range  ? Glucose 96 70 - 99 mg/dL  ? BUN 11 8 - 27 mg/dL  ? Creatinine, Ser 0.79 0.57 - 1.00 mg/dL  ? eGFR 84 >59  mL/min/1.73  ? BUN/Creatinine Ratio 14 12 - 28  ? Sodium 148 (H) 134 - 144 mmol/L  ? Potassium 4.3 3.5 - 5.2 mmol/L  ? Chloride 109 (H) 96 - 106 mmol/L  ? CO2 26 20 - 29 mmol/L  ? Calcium 9.1 8.7 - 10.3 mg/dL  ? Total Protein 6.4 6.0 - 8.5 g/dL  ? Albumin 4.2 3.8 - 4.8 g/dL  ? Globulin, Total 2.2 1.5 - 4.5 g/dL  ? Albumin/Globulin Ratio 1.9 1.2 - 2.2  ? Bilirubin Total 0.3 0.0 - 1.2 mg/dL  ? Alkaline Phosphatase 67 44 - 121 IU/L  ? AST 17 0 - 40 IU/L  ? ALT 14 0 - 32 IU/L  ?Lipid Panel With LDL/HDL Ratio  ?Result Value Ref Range  ? Cholesterol, Total 147 100 - 199 mg/dL  ? Triglycerides 114 0 - 149 mg/dL  ? HDL 57 >39 mg/dL  ? VLDL Cholesterol Cal 20 5 - 40 mg/dL  ? LDL Chol Calc (NIH) 70 0 - 99 mg/dL  ? LDL/HDL Ratio 1.2 0.0 - 3.2 ratio  ? ? ? ? ? ? ? ?

## 2022-01-06 NOTE — Progress Notes (Signed)
? ?Subjective:  ? ? Patient ID: [name], female    DOB: 26-May-1961, 62 y.o.   MRN: [id number] ? ?HPI ? ?Patient here for 5 month follow up ?COPD (chronic obstructive pulmonary disease) with chronic bronchitis ([place]) ? ?Senile purpura ([place]), Chronic ? ?Other specified hypothyroidism ? ?Essential hypertension, benign ? ?Mixed hyperlipidemia ?Results for orders placed or performed in visit on 01/01/22  ?TSH + free T4  ?Result Value Ref Range  ? TSH 2.030 0.450 - 4.500 uIU/mL  ? Free T4 1.70 0.82 - 1.77 ng/dL  ?Comprehensive metabolic panel  ?Result Value Ref Range  ? Glucose 96 70 - 99 mg/dL  ? BUN 11 8 - 27 mg/dL  ? Creatinine, Ser 0.79 0.57 - 1.00 mg/dL  ? eGFR 84 >59 mL/min/1.73  ? BUN/Creatinine Ratio 14 12 - 28  ? Sodium 148 (H) 134 - 144 mmol/L  ? Potassium 4.3 3.5 - 5.2 mmol/L  ? Chloride 109 (H) 96 - 106 mmol/L  ? CO2 26 20 - 29 mmol/L  ? Calcium 9.1 8.7 - 10.3 mg/dL  ? Total Protein 6.4 6.0 - 8.5 g/dL  ? Albumin 4.2 3.8 - 4.8 g/dL  ? Globulin, Total 2.2 1.5 - 4.5 g/dL  ? Albumin/Globulin Ratio 1.9 1.2 - 2.2  ? Bilirubin Total 0.3 0.0 - 1.2 mg/dL  ? Alkaline Phosphatase 67 44 - 121 IU/L  ? AST 17 0 - 40 IU/L  ? ALT 14 0 - 32 IU/L  ?Lipid Panel With LDL/HDL Ratio  ?Result Value Ref Range  ? Cholesterol, Total 147 100 - 199 mg/dL  ? Triglycerides 114 0 - 149 mg/dL  ? HDL 57 >39 mg/dL  ? VLDL Cholesterol Cal 20 5 - 40 mg/dL  ? LDL Chol Calc (NIH) 70 0 - 99 mg/dL  ? LDL/HDL Ratio 1.2 0.0 - 3.2 ratio  ? ?Patient does relate that her breathing not as good as it used to be.  She denies any chest pressure tightness pain.  She does relate a lot of feeling short winded when she pushes herself. ?She does bruise some on her arms intermittently ?She does state she tries to follow a healthy diet and take her cholesterol medicine regular basis ?Takes her thyroid medicine regular basis ?Labs were reviewed with patient in detail ? ?Review of Systems ? ?   ?Objective:  ? Physical Exam ?General-in no acute  distress ?Eyes-no discharge ?Lungs-respiratory rate normal, CTA ?CV-no murmurs,RRR ?Extremities skin warm dry no edema ?Neuro grossly normal ?Behavior normal, alert ? ? ? ?Senile purpura noted on the arms ?   ?Assessment & Plan:  ?1. COPD (chronic obstructive pulmonary disease) with chronic bronchitis ([place]) ?Mild progression.  Continue current inhalers.  If ongoing troubles or problems notify us. ? ?2. Senile purpura ([place]) ?Noted on the arms related to health issues and 81 mg aspirin ? ?3. Other specified hypothyroidism ?Recent lab work shows good control continue current measures ? ?4. Essential hypertension, benign ?Blood pressure good control continue current measures ? ?5. Mixed hyperlipidemia ?LDL now at 70.  Goal is to keep at 70 or less.  Healthy diet regular activity recheck this again by fall if it does go up again we will bump up the dose of rosuvastatin.  Stick with current dose of rosuvastatin currently ?Encouraged vegetables fruits lean meats portion control and regular physical activity ?6. Atherosclerosis of native coronary artery of native heart without angina pectoris ?Under good control currently ? ?Patient does do annual CT scan of the chest ? ?

## 2022-01-31 ENCOUNTER — Ambulatory Visit (HOSPITAL_COMMUNITY)
Admission: RE | Admit: 2022-01-31 | Discharge: 2022-01-31 | Disposition: A | Discharge status: Home / Self Care | Payer: Medicare Other | Source: Ambulatory Visit | Attending: Acute Care | Admitting: Acute Care

## 2022-01-31 DIAGNOSIS — Z87891 Personal history of nicotine dependence: Secondary | ICD-10-CM | POA: Insufficient documentation

## 2022-02-05 ENCOUNTER — Ambulatory Visit (INDEPENDENT_AMBULATORY_CARE_PROVIDER_SITE_OTHER): Payer: Medicare Other | Admitting: *Deleted

## 2022-02-05 DIAGNOSIS — I2510 Atherosclerotic heart disease of native coronary artery without angina pectoris: Secondary | ICD-10-CM

## 2022-02-05 DIAGNOSIS — J449 Chronic obstructive pulmonary disease, unspecified: Secondary | ICD-10-CM

## 2022-02-05 NOTE — Patient Instructions (Signed)
Visit Information ? ?Thank you for taking time to visit with me today. Please don't hesitate to contact me if I can be of assistance to you before our next scheduled telephone appointment. ? ?Following are the goals we discussed today:  ?Take medications as prescribed   ?Attend all scheduled provider appointments ?Perform all self care activities independently  ?Perform IADL's (shopping, preparing meals, housekeeping, managing finances) independently ?Call provider office for new concerns or questions  ?listen for public air quality announcements every day ?do breathing exercises every day ?develop a rescue plan ?eliminate symptom triggers at home ?don't eat or exercise right before bedtime ?Follow heart healthy diet ?Please complete advanced directives ?Please follow COPD action plan, call your doctor early for change in health status, symptoms ?Practice good handwashing, avoid sick people ?Continue walking daily- keep up the good work ?Call RN care manager for any questions at [phone] ? ?Our next appointment is by telephone on 04/30/22 at 9 am ? ?Please call the care guide team at [phone] if you need to cancel or reschedule your appointment.  ? ?If you are experiencing a Mental Health or [place] or need someone to talk to, please call the Suicide and Crisis Lifeline: 988 ?call the [place] National Suicide Prevention Lifeline: 517-675-8661 or TTY: 5742696965 TTY 786-887-1952) to talk to a trained counselor ?call 1-800-273-TALK (toll free, 24 hour hotline) ?go to [facility] [address], [place] [phone]) ?call 911  ? ?The patient verbalized understanding of instructions, educational materials, and care plan provided today and DECLINED offer to receive copy of patient instructions, educational materials, and care plan.  ? ?Jacqlyn Larsen RNC, BSN ?RN Case Manager ?[place] ?[phone] ? ?

## 2022-02-05 NOTE — Chronic Care Management (AMB) (Signed)
Chronic Care Management   CCM RN Visit Note  02/05/2022 Name: [name] MRN: [id number] DOB: [birth year]  Subjective: [name] is a 61 y.o. year old female who is a primary care patient of Luking, Elayne Snare, MD. The care management team was consulted for assistance with disease management and care coordination needs.    Engaged with patient by telephone for follow up visit in response to provider referral for case management and/or care coordination services.   Consent to Services:  The patient was given information about Chronic Care Management services, agreed to services, and gave verbal consent prior to initiation of services.  Please see initial visit note for detailed documentation.   Patient agreed to services and verbal consent obtained.   Assessment: Review of patient past medical history, allergies, medications, health status, including review of consultants reports, laboratory and other test data, was performed as part of comprehensive evaluation and provision of chronic care management services.   SDOH (Social Determinants of Health) assessments and interventions performed:    CCM Care Plan  No Known Allergies  Outpatient Encounter Medications as of 02/05/2022  Medication Sig   albuterol (PROVENTIL) (2.5 MG/3ML) 0.083% nebulizer solution USE 1 VIAL IN NEBULIZER EVERY 4 HOURS AS NEEDED FOR SHORTNESS OF BREATH   albuterol (VENTOLIN HFA) 108 (90 Base) MCG/ACT inhaler Inhale 1 puff into the lungs every 6 (six) hours as needed for shortness of breath.   aspirin 81 MG EC tablet Take 81 mg by mouth at bedtime.   Cholecalciferol (VITAMIN D3) 400 units CAPS Take 400 Units by mouth at bedtime.    Fluticasone-Umeclidin-Vilant (TRELEGY ELLIPTA) 100-62.5-25 MCG/ACT AEPB INHALE 1 PUFF BY MOUTH ONCE DAILY RINSE MOUTH AFTER USE   levothyroxine (SYNTHROID) 125 MCG tablet 1 qam all days except Mondays and fridays  use 1/2 in the am.   metoprolol tartrate (LOPRESSOR) 25 MG tablet  Take 0.5 tablets (12.5 mg total) by mouth 2 (two) times daily.   Multiple Vitamin (MULTIVITAMIN) tablet Take 1 tablet by mouth daily.   nitroGLYCERIN (NITROSTAT) 0.4 MG SL tablet Place 1 tablet (0.4 mg total) under the tongue every 5 (five) minutes x 3 doses as needed.   pantoprazole (PROTONIX) 40 MG tablet Take 1 tablet (40 mg total) by mouth daily.   rosuvastatin (CRESTOR) 20 MG tablet Take 1 tablet (20 mg total) by mouth daily.   vitamin C (ASCORBIC ACID) 500 MG tablet Take 500 mg by mouth daily.   No facility-administered encounter medications on file as of 02/05/2022.    Patient Active Problem List   Diagnosis Date Noted   Hematochezia 09/09/2021   GERD (gastroesophageal reflux disease) 08/06/2021   Senile purpura ([place]) 01/21/2021   History of colonic polyps 03/12/2018   Skin cancer 05/11/2017   Hypothyroidism 08/23/2014   COPD (chronic obstructive pulmonary disease) with chronic bronchitis ([place]) 08/23/2014   Mixed hyperlipidemia 05/15/2009   Essential hypertension, benign 05/15/2009   CAD, NATIVE VESSEL 05/15/2009    Conditions to be addressed/monitored:CAD and COPD  Care Plan : RN Care Manager Plan of Care  Updates made by Kassie Mends, RN since 02/05/2022 12:00 AM     Problem: No plan of care established for management of chronic disease states  (COPD, CAD)   Priority: High     Long-Range Goal: Development of plan of care for chronic disease management  (COPD, CAD)   Start Date: 08/28/2021  Expected End Date: 08/04/2022  Priority: High  Note:   Current Barriers:  Knowledge Deficits  related to plan of care for management of CAD and COPD  No Advanced Directives in place- pt requests packet be mailed Pt reports she lives with spouse, is independent in all aspects of her care, continues to drive, reports no further episodes of bronchitis, reports now walking one mile per day. Pt needs reinforcement of COPD action plan, management of CAD, diet.  RNCM Clinical Goal(s):   Patient will verbalize understanding of plan for management of CAD and COPD as evidenced by patient report, review of EHR and  through collaboration with RN Care manager, provider, and care team.   Interventions: 1:1 collaboration with primary care provider regarding development and update of comprehensive plan of care as evidenced by provider attestation and co-signature Inter-disciplinary care team collaboration (see longitudinal plan of care) Evaluation of current treatment plan related to  self management and patient's adherence to plan as established by provider   CAD Interventions: (Status:  New goal.) Long Term Goal Assessed understanding of CAD diagnosis Medications reviewed including medications utilized in CAD treatment plan Provided education on importance of blood pressure control in management of CAD Provided education on Importance of limiting foods high in cholesterol Counseled on importance of regular laboratory monitoring as prescribed Counseled on the importance of exercise goals with target of 150 minutes per week Reviewed Importance of taking all medications as prescribed Reviewed Importance of attending all scheduled provider appointments Reviewed heart healthy diet Confirmed receipt of advanced directives packet  COPD Interventions:  (Status:  New goal.) Long Term Goal Provided patient with basic written and verbal COPD education on self care/management/and exacerbation prevention Advised patient to self assesses COPD action plan zone and make appointment with provider if in the yellow zone for 48 hours without improvement Advised patient to engage in light exercise as tolerated 3-5 days a week to aid in the the management of COPD Provided education about and advised patient to utilize infection prevention strategies to reduce risk of respiratory infection Encouraged patient to continue walking daily  Patient Goals/Self-Care Activities: Take medications as  prescribed   Attend all scheduled provider appointments Perform all self care activities independently  Perform IADL's (shopping, preparing meals, housekeeping, managing finances) independently Call provider office for new concerns or questions  listen for public air quality announcements every day do breathing exercises every day develop a rescue plan eliminate symptom triggers at home don't eat or exercise right before bedtime Follow heart healthy diet Please complete advanced directives Please follow COPD action plan, call your doctor early for change in health status, symptoms Practice good handwashing, avoid sick people Continue walking daily- keep up the good work Economist for any questions at [phone]       Plan:Telephone follow up appointment with care management team member scheduled for:  04/30/22  Jacqlyn Larsen [facility], BSN RN Case Manager [place] [phone]

## 2022-02-19 DIAGNOSIS — I2510 Atherosclerotic heart disease of native coronary artery without angina pectoris: Secondary | ICD-10-CM

## 2022-02-19 DIAGNOSIS — J449 Chronic obstructive pulmonary disease, unspecified: Secondary | ICD-10-CM | POA: Diagnosis not present

## 2022-04-23 ENCOUNTER — Ambulatory Visit (INDEPENDENT_AMBULATORY_CARE_PROVIDER_SITE_OTHER): Payer: Medicare Other | Admitting: *Deleted

## 2022-04-23 ENCOUNTER — Encounter: Payer: Self-pay | Admitting: *Deleted

## 2022-04-23 DIAGNOSIS — J449 Chronic obstructive pulmonary disease, unspecified: Secondary | ICD-10-CM

## 2022-04-23 DIAGNOSIS — I2510 Atherosclerotic heart disease of native coronary artery without angina pectoris: Secondary | ICD-10-CM

## 2022-04-23 NOTE — Patient Instructions (Signed)
Visit Information  Thank you for taking time to visit with me today. Please don't hesitate to contact me if I can be of assistance to you before our next scheduled telephone appointment.  Following are the goals we discussed today:  Take medications as prescribed   Attend all scheduled provider appointments Call pharmacy for medication refills 3-7 days in advance of running out of medications Perform all self care activities independently  Perform IADL's (shopping, preparing meals, housekeeping, managing finances) independently Call provider office for new concerns or questions  listen for public air quality announcements every day do breathing exercises every day develop a rescue plan eliminate symptom triggers at home don't eat or exercise right before bedtime get at least 7 to 8 hours of sleep at night practice relaxation or meditation daily Follow heart healthy diet Please complete advanced directives Please follow COPD action plan, call your doctor early for change in health status, symptoms Practice good handwashing, avoid sick people Continue walking daily- keep up the good work Expect a call from [facility] pharmacist for assistance with trelegy Case closure today for RN care manager  Please call the care guide team at [phone] if you need to cancel or reschedule your appointment.   If you are experiencing a Mental Health or [place] or need someone to talk to, please call the Suicide and Crisis Lifeline: 988 call the [place] National Suicide Prevention Lifeline: 317 687 9499 or TTY: 435-024-7096 TTY 817-880-1287) to talk to a trained counselor call 1-800-273-TALK (toll free, 24 hour hotline) go to [facility] [address], [place] [phone]) call 911   The patient verbalized understanding of instructions, educational materials, and care plan provided today and DECLINED offer to receive copy of patient instructions,  educational materials, and care plan.   Jacqlyn Larsen [facility], BSN RN Case Manager [facility] [phone]

## 2022-04-23 NOTE — Progress Notes (Signed)
Routed CCM note and message to [place]: [phone]

## 2022-04-23 NOTE — Chronic Care Management (AMB) (Signed)
Chronic Care Management   CCM RN Visit Note  04/23/2022 Name: [name] MRN: [id number] DOB: [birth year]  Subjective: [name] is a 61 y.o. year old female who is a primary care patient of Luking, Elayne Snare, MD. The care management team was consulted for assistance with disease management and care coordination needs.    Engaged with patient by telephone for follow up visit in response to provider referral for case management and/or care coordination services.   Consent to Services:  The patient was given information about Chronic Care Management services, agreed to services, and gave verbal consent prior to initiation of services.  Please see initial visit note for detailed documentation.   Patient agreed to services and verbal consent obtained.   Assessment: Review of patient past medical history, allergies, medications, health status, including review of consultants reports, laboratory and other test data, was performed as part of comprehensive evaluation and provision of chronic care management services.   SDOH (Social Determinants of Health) assessments and interventions performed:    CCM Care Plan  No Known Allergies  Outpatient Encounter Medications as of 04/23/2022  Medication Sig   albuterol (PROVENTIL) (2.5 MG/3ML) 0.083% nebulizer solution USE 1 VIAL IN NEBULIZER EVERY 4 HOURS AS NEEDED FOR SHORTNESS OF BREATH   albuterol (VENTOLIN HFA) 108 (90 Base) MCG/ACT inhaler Inhale 1 puff into the lungs every 6 (six) hours as needed for shortness of breath.   aspirin 81 MG EC tablet Take 81 mg by mouth at bedtime.   Cholecalciferol (VITAMIN D3) 400 units CAPS Take 400 Units by mouth at bedtime.    Fluticasone-Umeclidin-Vilant (TRELEGY ELLIPTA) 100-62.5-25 MCG/ACT AEPB INHALE 1 PUFF BY MOUTH ONCE DAILY RINSE MOUTH AFTER USE   levothyroxine (SYNTHROID) 125 MCG tablet 1 qam all days except Mondays and fridays  use 1/2 in the am.   metoprolol tartrate (LOPRESSOR) 25 MG tablet  Take 0.5 tablets (12.5 mg total) by mouth 2 (two) times daily.   Multiple Vitamin (MULTIVITAMIN) tablet Take 1 tablet by mouth daily.   nitroGLYCERIN (NITROSTAT) 0.4 MG SL tablet Place 1 tablet (0.4 mg total) under the tongue every 5 (five) minutes x 3 doses as needed.   pantoprazole (PROTONIX) 40 MG tablet Take 1 tablet (40 mg total) by mouth daily.   rosuvastatin (CRESTOR) 20 MG tablet Take 1 tablet (20 mg total) by mouth daily.   vitamin C (ASCORBIC ACID) 500 MG tablet Take 500 mg by mouth daily.   No facility-administered encounter medications on file as of 04/23/2022.    Patient Active Problem List   Diagnosis Date Noted   Hematochezia 09/09/2021   GERD (gastroesophageal reflux disease) 08/06/2021   Senile purpura ([place]) 01/21/2021   History of colonic polyps 03/12/2018   Skin cancer 05/11/2017   Hypothyroidism 08/23/2014   COPD (chronic obstructive pulmonary disease) with chronic bronchitis ([place]) 08/23/2014   Mixed hyperlipidemia 05/15/2009   Essential hypertension, benign 05/15/2009   CAD, NATIVE VESSEL 05/15/2009    Conditions to be addressed/monitored:CAD and COPD  Care Plan : RN Care Manager Plan of Care  Updates made by Kassie Mends, RN since 04/23/2022 12:00 AM     Problem: No plan of care established for management of chronic disease states  (COPD, CAD)   Priority: High     Long-Range Goal: Development of plan of care for chronic disease management  (COPD, CAD)   Start Date: 08/28/2021  Expected End Date: 08/04/2022  Priority: High  Note:   Current Barriers:  Knowledge Deficits  related to plan of care for management of CAD and COPD  No Advanced Directives in place- pt requests packet be mailed Pt reports she lives with spouse, is independent in all aspects of her care, continues to drive, reports no further episodes of bronchitis, reports now walking one mile per day. Pt needs reinforcement of COPD action plan, management of CAD, diet. 04/23/22- spoke with patient,  report she is doing well, no new changes today, states when trelegy is refilled on August 16 it will cost $153.00 and pt cannot afford this going forward, pt requests assistance from pharmacy, pt states she all medications and taking as prescribed.  RNCM Clinical Goal(s):  Patient will verbalize understanding of plan for management of CAD and COPD as evidenced by patient report, review of EHR and  through collaboration with RN Care manager, provider, and care team.   Interventions: 1:1 collaboration with primary care provider regarding development and update of comprehensive plan of care as evidenced by provider attestation and co-signature Inter-disciplinary care team collaboration (see longitudinal plan of care) Evaluation of current treatment plan related to  self management and patient's adherence to plan as established by provider   CAD Interventions: (Status:  New goal.) Long Term Goal Assessed understanding of CAD diagnosis Medications reviewed including medications utilized in CAD treatment plan Provided education on importance of blood pressure control in management of CAD Provided education on Importance of limiting foods high in cholesterol Counseled on the importance of exercise goals with target of 150 minutes per week Reviewed Importance of taking all medications as prescribed Reviewed Importance of attending all scheduled provider appointments Reviewed heart healthy diet Reviewed plan of care with patient including case closure today  COPD Interventions:  (Status:  New goal.) Long Term Goal Advised patient to self assesses COPD action plan zone and make appointment with provider if in the yellow zone for 48 hours without improvement Advised patient to engage in light exercise as tolerated 3-5 days a week to aid in the the management of COPD Provided education about and advised patient to utilize infection prevention strategies to reduce risk of respiratory  infection Encouraged patient to continue walking daily Encouraged patient to listen to public weather announcements daily Referral for [facility] pharmacist for assistance with trelegy  Patient Goals/Self-Care Activities: Take medications as prescribed   Attend all scheduled provider appointments Call pharmacy for medication refills 3-7 days in advance of running out of medications Perform all self care activities independently  Perform IADL's (shopping, preparing meals, housekeeping, managing finances) independently Call provider office for new concerns or questions  listen for public air quality announcements every day do breathing exercises every day develop a rescue plan eliminate symptom triggers at home don't eat or exercise right before bedtime get at least 7 to 8 hours of sleep at night practice relaxation or meditation daily Follow heart healthy diet Please complete advanced directives Please follow COPD action plan, call your doctor early for change in health status, symptoms Practice good handwashing, avoid sick people Continue walking daily- keep up the good work Expect a call from [facility] pharmacist for assistance with trelegy Case closure today for RN care manager       Plan:No further follow up required: case closure for RN care manager, pharmacist will outreach  Jacqlyn Larsen [facility] BSN RN Case Manager [place] [phone]

## 2022-04-25 ENCOUNTER — Telehealth: Payer: Self-pay

## 2022-04-25 DIAGNOSIS — Z596 Low income: Secondary | ICD-10-CM

## 2022-04-28 NOTE — Progress Notes (Signed)
[facility] [facility])  [place] Team    04/28/2022  [name] May 03, 1961 [id number]  Reason for referral: Medication Assistance  Referral source: [facility] RN Care Management  Current insurance: [place] [employer]   Outreach:  Successful telephone call with [name].  HIPAA identifiers verified.   Subjective:  Pharmacy received a medication assistance referral for Trelegy. Per discussion with patient, she will be in the Medicare Coverage Gap soon and Trelegy will cot $158 per 30 days. Patient's household income for two people meets the criteria for [employer] PAP but OOP is currently $269.23. Discussed alternative therapy for Trelegy, [name], and patient was open to switching since there isn't a OOP requirement. She reports ~2 weeks remaining of Trelegy.     Medication Assistance Findings:  Medication assistance needs identified: Librarian, academic    Patient Assistance Programs: [place] made by [employer] requirement met: Yes Out-of-pocket prescription expenditure met:   Not Applicable Reviewed program requirements with patient.     Plan: I will route patient assistance letter to [place] technician who will coordinate patient assistance program application process for medications listed above.  [facility] pharmacy technician will assist with obtaining all required documents from both patient and provider(s) and submit application(s) once completed.  Will contact Dr. Wolfgang Phoenix regarding switching patient to [facility].   Thank you for allowing pharmacy to be a part of this patient's care. Kristeen Miss, PharmD Clinical Pharmacist [place] Cell: [phone]

## 2022-04-30 ENCOUNTER — Telehealth: Payer: Self-pay

## 2022-05-05 ENCOUNTER — Other Ambulatory Visit: Payer: Self-pay | Admitting: *Deleted

## 2022-05-05 MED ORDER — BREZTRI AEROSPHERE 160-9-4.8 MCG/ACT IN AERO: 2.0000 | INHALATION_SPRAY | Freq: Two times a day (BID) | RESPIRATORY_TRACT | 11 refills | Status: DC

## 2022-05-05 NOTE — Progress Notes (Signed)
Left message to return call 

## 2022-05-08 ENCOUNTER — Telehealth: Payer: Self-pay | Admitting: Pharmacy Technician

## 2022-05-08 DIAGNOSIS — Z596 Low income: Secondary | ICD-10-CM

## 2022-05-08 NOTE — Progress Notes (Signed)
[place] [facility])                                            [place] Team    05/08/2022  ARNETTE DRIGGS 12-18-1960 [id number]                                      Medication Assistance Referral  Referral From:  [facility] Asajah Damita Dunnings  Medication/Company: Judithann Sauger / [place] Patient application portion:  Mailed Provider application portion: Faxed  to Dr. Sallee Lange Provider address/fax verified via: Office website   Zymir Napoli P. Mayre Bury, [place]  [phone]

## 2022-05-20 ENCOUNTER — Telehealth: Payer: Self-pay | Admitting: Pharmacy Technician

## 2022-05-20 DIAGNOSIS — Z596 Low income: Secondary | ICD-10-CM

## 2022-05-20 NOTE — Progress Notes (Signed)
[place] [facility])                                            [place] Team    05/20/2022  [name] 1960-11-28 [id number]  Received both patient and provider portion(s) of patient assistance application(s) for [facility] Faxed completed application and required documents into [employer].    [name], [name]  [phone]

## 2022-05-22 DIAGNOSIS — J449 Chronic obstructive pulmonary disease, unspecified: Secondary | ICD-10-CM

## 2022-05-22 DIAGNOSIS — I2510 Atherosclerotic heart disease of native coronary artery without angina pectoris: Secondary | ICD-10-CM | POA: Diagnosis not present

## 2022-05-28 ENCOUNTER — Telehealth: Payer: Self-pay | Admitting: Pharmacy Technician

## 2022-05-28 DIAGNOSIS — Z596 Low income: Secondary | ICD-10-CM

## 2022-05-28 NOTE — Progress Notes (Signed)
[place] [facility])                                            [place] Team    05/28/2022  CRISTIE MCKINNEY [birth year] [id number]  Care coordination call placed to [employer] in regard to [facility] application.  Spoke to [place] who informs patient is APPROVED 05/22/22-09/21/22. Medication will be delivered to the patient's home. HIPAA compliant message left for patient.  Parilee Hally P. Govani Radloff, [place]  [phone]

## 2022-06-02 DIAGNOSIS — L308 Other specified dermatitis: Secondary | ICD-10-CM | POA: Diagnosis not present

## 2022-06-02 DIAGNOSIS — L570 Actinic keratosis: Secondary | ICD-10-CM | POA: Diagnosis not present

## 2022-06-02 DIAGNOSIS — X32XXXD Exposure to sunlight, subsequent encounter: Secondary | ICD-10-CM | POA: Diagnosis not present

## 2022-06-18 ENCOUNTER — Telehealth: Payer: Self-pay | Admitting: Family Medicine

## 2022-06-18 ENCOUNTER — Other Ambulatory Visit: Payer: Self-pay

## 2022-06-18 DIAGNOSIS — E038 Other specified hypothyroidism: Secondary | ICD-10-CM

## 2022-06-18 DIAGNOSIS — E782 Mixed hyperlipidemia: Secondary | ICD-10-CM

## 2022-06-18 DIAGNOSIS — Z79899 Other long term (current) drug therapy: Secondary | ICD-10-CM

## 2022-06-18 NOTE — Telephone Encounter (Signed)
Lipid, liver, TSH Hyperlipidemia hypothyroidism

## 2022-06-18 NOTE — Telephone Encounter (Signed)
Pt left voicemail on message wanting to know if she needed lab work for upcoming appt on 07/09/22. Last labs completed 01/01/22 Lipid Panel with LDL/HDL Ratio, CMP and TSH+Free T4. Please advise. Thank you. Pt sates we can leave a message if no answer.

## 2022-06-18 NOTE — Telephone Encounter (Signed)
Patient made aware per provider notes .

## 2022-06-20 ENCOUNTER — Other Ambulatory Visit: Payer: Self-pay

## 2022-06-20 ENCOUNTER — Telehealth: Payer: Self-pay | Admitting: Family Medicine

## 2022-06-20 DIAGNOSIS — R059 Cough, unspecified: Secondary | ICD-10-CM

## 2022-06-20 MED ORDER — AMOXICILLIN-POT CLAVULANATE 875-125 MG PO TABS: 1.0000 | ORAL_TABLET | Freq: Two times a day (BID) | ORAL | 0 refills | Status: AC

## 2022-06-20 NOTE — Telephone Encounter (Signed)
Pt left voicemail on nurse line wanting provider to call in antibiotic for lung infection.  Contacted patient to get more information. Pt states she is having a cough and dark mucus. No blood in mucus, no trouble breathing, no shortness of breath and no wheezing. Offered pt appt at 4 pm today but pt states she is unable to make it to appt due to grandson being in a parade. Pt is going out of town on Sunday and will be back on Thursday and doesn't want this to worsen while out of town. Please advise. Thank you.

## 2022-06-20 NOTE — Telephone Encounter (Signed)
Patient made aware per drs notes , she verbalizes understanding.

## 2022-06-20 NOTE — Telephone Encounter (Signed)
Augmentin 875 mg 1 twice daily for 7 days If progressive troubles or worse go to ER or urgent care Please notify us if having any other issues

## 2022-07-03 DIAGNOSIS — E038 Other specified hypothyroidism: Secondary | ICD-10-CM | POA: Diagnosis not present

## 2022-07-03 DIAGNOSIS — Z79899 Other long term (current) drug therapy: Secondary | ICD-10-CM | POA: Diagnosis not present

## 2022-07-03 DIAGNOSIS — E782 Mixed hyperlipidemia: Secondary | ICD-10-CM | POA: Diagnosis not present

## 2022-07-04 LAB — HEPATIC FUNCTION PANEL
ALT: 13 IU/L (ref 0–32)
AST: 15 IU/L (ref 0–40)
Albumin: 4.6 g/dL (ref 3.9–4.9)
Alkaline Phosphatase: 67 IU/L (ref 44–121)
Bilirubin Total: 0.6 mg/dL (ref 0.0–1.2)
Bilirubin, Direct: 0.16 mg/dL (ref 0.00–0.40)
Total Protein: 6.7 g/dL (ref 6.0–8.5)

## 2022-07-04 LAB — LIPID PANEL
Chol/HDL Ratio: 3.2 ratio (ref 0.0–4.4)
Cholesterol, Total: 167 mg/dL (ref 100–199)
HDL: 52 mg/dL (ref >39)
LDL Chol Calc (NIH): 90 mg/dL (ref 0–99)
Triglycerides: 142 mg/dL (ref 0–149)
VLDL Cholesterol Cal: 25 mg/dL (ref 5–40)

## 2022-07-04 LAB — TSH: TSH: 5.75 u[IU]/mL — ABNORMAL HIGH (ref 0.450–4.500)

## 2022-07-09 ENCOUNTER — Ambulatory Visit (INDEPENDENT_AMBULATORY_CARE_PROVIDER_SITE_OTHER): Payer: Medicare Other | Admitting: Family Medicine

## 2022-07-09 VITALS — BP 127/72 | Temp 97.3°F | Ht 62.0 in | Wt 148.0 lb

## 2022-07-09 DIAGNOSIS — E038 Other specified hypothyroidism: Secondary | ICD-10-CM

## 2022-07-09 DIAGNOSIS — D692 Other nonthrombocytopenic purpura: Secondary | ICD-10-CM | POA: Diagnosis not present

## 2022-07-09 DIAGNOSIS — Z23 Encounter for immunization: Secondary | ICD-10-CM

## 2022-07-09 DIAGNOSIS — I700 Atherosclerosis of aorta: Secondary | ICD-10-CM

## 2022-07-09 DIAGNOSIS — J4489 Other specified chronic obstructive pulmonary disease: Secondary | ICD-10-CM

## 2022-07-09 DIAGNOSIS — E782 Mixed hyperlipidemia: Secondary | ICD-10-CM

## 2022-07-09 DIAGNOSIS — R5383 Other fatigue: Secondary | ICD-10-CM

## 2022-07-09 MED ORDER — LEVOTHYROXINE SODIUM 125 MCG PO TABS
ORAL_TABLET | ORAL | 1 refills | Status: DC
Start: 2022-07-09 — End: 2022-12-17

## 2022-07-09 MED ORDER — ROSUVASTATIN CALCIUM 20 MG PO TABS: 20.0000 mg | ORAL_TABLET | Freq: Every day | ORAL | 3 refills | Status: DC

## 2022-07-09 MED ORDER — PANTOPRAZOLE SODIUM 40 MG PO TBEC: 40.0000 mg | DELAYED_RELEASE_TABLET | Freq: Every day | ORAL | 1 refills | Status: DC

## 2022-07-09 MED ORDER — METOPROLOL TARTRATE 25 MG PO TABS: 12.5000 mg | ORAL_TABLET | Freq: Two times a day (BID) | ORAL | 1 refills | Status: DC

## 2022-07-09 NOTE — Progress Notes (Signed)
   Subjective:    Patient ID: [name], female    DOB: [birth year], 61 y.o.   MRN: [id number]  HPI 6 month follow up COPD and chronic medical conditions Extrem with excessive activity Does suffer with COPD and has intermittent flareups ely nice patient Has a fair number of underlying illnesses Takes her medicine regular basis Not smoking anymore Does get out of breath Other specified hypothyroidism - Plan: TSH + free T4  COPD (chronic obstructive pulmonary disease) with chronic bronchitis  Senile purpura ([place]) - Plan: TSH + free T4, CBC with Differential/Platelet  Mixed hyperlipidemia  Other fatigue - Plan: TSH + free T4, CBC with Differential/Platelet  Immunization due - Plan: Flu Vaccine QUAD 58moIM (Fluarix, Fluzone & Alfiuria Quad PF)  She does complain of intermittent fatigue tiredness feeling rundown.  States there is family history of thyroid. Review of Systems     Objective:   Physical Exam General-in no acute distress Eyes-no discharge Lungs-respiratory rate normal, CTA CV-no murmurs,RRR Extremities skin warm dry no edema Neuro grossly normal Behavior normal, alert  Has is bruising on the arms      Assessment & Plan:  1. Other specified hypothyroidism We will adjust the medication instead of 1/2 tablet twice a week work bump up the dose to half tablet once a week with 1 tablet on all the other days repeat lab work in approximately 8 to 10 weeks - TSH + free T4  2. COPD (chronic obstructive pulmonary disease) with chronic bronchitis Continue the inhalers for the COPD flu vaccine today warning signs discussed  3. Senile purpura (HCC) Bruising on the arms consistent with senile purpura check CBC - TSH + free T4 - CBC with Differential/Platelet  4. Mixed hyperlipidemia History hyperlipidemia takes rosuvastatin  5. Other fatigue Check lab work await results - TSH + free T4 - CBC with Differential/Platelet  6. Immunization due Flu shot - Flu  Vaccine QUAD 63moM (Fluarix, Fluzone & Alfiuria Quad PF)  Has history of aortic atherosclerosis gets her cholesterol under good control with medications  Follow-up in 6 months

## 2022-08-07 ENCOUNTER — Encounter: Payer: Self-pay | Admitting: Nurse Practitioner

## 2022-08-07 ENCOUNTER — Ambulatory Visit (INDEPENDENT_AMBULATORY_CARE_PROVIDER_SITE_OTHER): Payer: Medicare Other | Admitting: Nurse Practitioner

## 2022-08-07 VITALS — BP 132/80 | HR 85 | Temp 98.2°F | Ht 62.0 in | Wt 150.6 lb

## 2022-08-07 DIAGNOSIS — J441 Chronic obstructive pulmonary disease with (acute) exacerbation: Secondary | ICD-10-CM

## 2022-08-07 MED ORDER — AZITHROMYCIN 250 MG PO TABS: ORAL_TABLET | ORAL | 0 refills | Status: AC

## 2022-08-07 MED ORDER — PREDNISONE 10 MG PO TABS
ORAL_TABLET | ORAL | 0 refills | Status: AC
Start: 2022-08-07 — End: 2022-08-16

## 2022-08-07 NOTE — Progress Notes (Signed)
/    Subjective:    Patient ID: [name], female    DOB: June 15, 1961, 61 y.o.   MRN: [id number]  Cough This is a new problem. The current episode started 1 to 4 weeks ago.   61 year old female patient presents to clinic today for coughing congestion and increased shortness of breath x3 to 4 weeks.  Patient has history of COPD.  Patient denies any fevers, chills, body aches, feeling ill.    Patient states due to insurance she has not been able to afford Trelegy is now on Breztri and states that she feels like her COPD is not managed as well on that medication.     Review of Systems  Respiratory:  Positive for cough.        Objective:   Physical Exam Vitals reviewed.  Constitutional:      General: She is not in acute distress.    Appearance: Normal appearance. She is normal weight. She is not ill-appearing, toxic-appearing or diaphoretic.  HENT:     Head: Normocephalic and atraumatic.  Cardiovascular:     Rate and Rhythm: Normal rate and regular rhythm.     Pulses: Normal pulses.     Heart sounds: Normal heart sounds. No murmur heard. Pulmonary:     Effort: Pulmonary effort is normal. No respiratory distress.     Breath sounds: Wheezing present.     Comments: Finding expiratory wheezes heard throughout lung fields Musculoskeletal:     Comments: Grossly intact  Skin:    General: Skin is warm.     Capillary Refill: Capillary refill takes less than 2 seconds.  Neurological:     Mental Status: She is alert.     Comments: Grossly intact  Psychiatric:        Mood and Affect: Mood normal.        Behavior: Behavior normal.           Assessment & Plan:   1. COPD exacerbation (HCC) -Likely COPD exacerbation - azithromycin (ZITHROMAX) 250 MG tablet; Take 2 tablets on day 1, then 1 tablet daily on days 2 through 5  Dispense: 6 tablet; Refill: 0 - predniSONE (DELTASONE) 10 MG tablet; Take 2 tablets (20 mg total) by mouth 2 (two) times daily with a meal for 3 days, THEN 1  tablet (10 mg total) 2 (two) times daily with a meal for 3 days, THEN 1 tablet (10 mg total) daily with breakfast for 3 days.  Dispense: 21 tablet; Refill: 0 -Return to clinic on Monday to be reevaluated (4 days) -If symptoms worsen or shortness of breath increases go to emergency room or urgent care.    Note:  This document was prepared using Dragon voice recognition software and may include unintentional dictation errors. Note - This record has been created using [employer].  Chart creation errors have been sought, but may not always  have been located. Such creation errors do not reflect on  the standard of medical care.

## 2022-08-12 ENCOUNTER — Encounter: Payer: Self-pay | Admitting: Family Medicine

## 2022-08-12 ENCOUNTER — Telehealth: Payer: Self-pay

## 2022-08-12 ENCOUNTER — Ambulatory Visit (INDEPENDENT_AMBULATORY_CARE_PROVIDER_SITE_OTHER): Payer: Medicare Other | Admitting: Family Medicine

## 2022-08-12 VITALS — BP 134/72 | Temp 96.8°F | Wt 150.4 lb

## 2022-08-12 DIAGNOSIS — J441 Chronic obstructive pulmonary disease with (acute) exacerbation: Secondary | ICD-10-CM | POA: Diagnosis not present

## 2022-08-12 MED ORDER — DOXYCYCLINE HYCLATE 100 MG PO TABS: 100.0000 mg | ORAL_TABLET | Freq: Two times a day (BID) | ORAL | 0 refills | Status: DC

## 2022-08-12 MED ORDER — METHYLPREDNISOLONE ACETATE 40 MG/ML IJ SUSP
40.0000 mg | Freq: Once | INTRAMUSCULAR | Status: AC
  Administered 2022-08-12: 40 mg via INTRAMUSCULAR

## 2022-08-12 NOTE — Progress Notes (Signed)
   Subjective:    Patient ID: [name], female    DOB: 28-Jun-1961, 61 y.o.   MRN: [id number]  HPI Pt arrives with cough and congestion. Pt states cough is productive. Some wheezing at times. Pt finishes antibiotic today and has 4 doses of Prednisone left.  She relates ongoing congestion coughing denies severe wheezing using albuterol as needed no vomiting.  Review of Systems     Objective:   Physical Exam  As upper airway congestion but no crackles no sign of pneumonia some head congestion      Assessment & Plan:   COPD exacerbation Depo-Medrol shot Finish out prednisone 1 additional week of antibiotics If any side effects problems or complications notify us Follow-up with any ongoing issues

## 2022-08-12 NOTE — Progress Notes (Signed)
[place] [facility]) [place]   08/12/2022  PARVEEN FREEHLING [birth year] [id number]   Reason for referral: Medication Assistance  Referral source:  [place] Technician   Reason for call: Medication assistance renewal for [facility]:  Unsuccessful telephone call attempt #1 to patient.   HIPAA compliant voicemail left requesting a return call  Plan:  -I will make another outreach attempt to patient within 5-7 business days due to the holiday.  -I am happy to assist in the future as needed.  -Thank you for allowing [facility] pharmacy to be involved in this patient's care.   Kristeen Miss, PharmD Clinical Pharmacist [place] Cell: [phone]

## 2022-08-26 ENCOUNTER — Telehealth: Payer: Self-pay

## 2022-08-26 NOTE — Progress Notes (Signed)
[place] [facility]) [place]   08/26/2022  LAMOYNE PALENCIA 1961/05/12 [id number]  Reason for referral: Medication Assistance with [facility]  Referral source:  [place] Technician Current insurance: [place]  Reason for call: Medication assistance renewal for 2023.   Outreach:  Unsuccessful telephone call attempt #2 to patient.   HIPAA compliant voicemail left requesting a return call  Plan:  -I will make another outreach attempt to patient within 3-4 business days.  Thank you for allowing [facility] pharmacy to be a part of this patient's care.  Kristeen Miss, PharmD Clinical Pharmacist [place] Cell: [phone]

## 2022-08-26 NOTE — Progress Notes (Addendum)
[facility]) [place]   08/26/2022  ASHANI PUMPHREY 1961-01-15 [id number]  Reason for referral: Medication Assistance with [facility]  Referral source:  [place] Technician Current insurance: [place]  Reason for call: Medication assistance renewal for 2023.   Outreach:  Patient called this pharmacist back. She states that [employer] already sent her a letter telling her she was approved for [facility] medication assistance for next year. Explained to patient that since she is already approved for assistance directly from the company, next year she would need to contact her PCP for another referral if she has medication assistance needs since she will no longer be on our automatic renewal patient list.   Plan:  -I will close [place] case at this time as there are no additional needs for medication assistance.  -Asked patient to bring the approval letter with her to the next office visit to scan into chart. -I am happy to assist in the future as needed.  Thank you for allowing [facility] pharmacy to be a part of this patient's care.  Kristeen Miss, PharmD Clinical Pharmacist [place] Cell: [phone]

## 2022-09-08 DIAGNOSIS — D692 Other nonthrombocytopenic purpura: Secondary | ICD-10-CM | POA: Diagnosis not present

## 2022-09-08 DIAGNOSIS — R5383 Other fatigue: Secondary | ICD-10-CM | POA: Diagnosis not present

## 2022-09-08 DIAGNOSIS — E038 Other specified hypothyroidism: Secondary | ICD-10-CM | POA: Diagnosis not present

## 2022-09-09 ENCOUNTER — Encounter: Payer: Self-pay | Admitting: Family Medicine

## 2022-09-09 LAB — CBC WITH DIFFERENTIAL/PLATELET
Basophils Absolute: 0.1 10*3/uL (ref 0.0–0.2)
Basos: 1 %
EOS (ABSOLUTE): 0.1 10*3/uL (ref 0.0–0.4)
Eos: 3 %
Hematocrit: 42.5 % (ref 34.0–46.6)
Hemoglobin: 14.4 g/dL (ref 11.1–15.9)
Immature Grans (Abs): 0 10*3/uL (ref 0.0–0.1)
Immature Granulocytes: 0 %
Lymphocytes Absolute: 2 10*3/uL (ref 0.7–3.1)
Lymphs: 39 %
MCH: 31.4 pg (ref 26.6–33.0)
MCHC: 33.9 g/dL (ref 31.5–35.7)
MCV: 93 fL (ref 79–97)
Monocytes Absolute: 0.6 10*3/uL (ref 0.1–0.9)
Monocytes: 12 %
Neutrophils Absolute: 2.4 10*3/uL (ref 1.4–7.0)
Neutrophils: 45 %
Platelets: 237 10*3/uL (ref 150–450)
RBC: 4.58 x10E6/uL (ref 3.77–5.28)
RDW: 12.2 % (ref 11.7–15.4)
WBC: 5.2 10*3/uL (ref 3.4–10.8)

## 2022-09-09 LAB — TSH+FREE T4
Free T4: 1.57 ng/dL (ref 0.82–1.77)
TSH: 1.23 u[IU]/mL (ref 0.450–4.500)

## 2022-09-09 NOTE — Progress Notes (Signed)
Please mail to the patient 

## 2022-09-12 ENCOUNTER — Ambulatory Visit (INDEPENDENT_AMBULATORY_CARE_PROVIDER_SITE_OTHER): Payer: Medicare Other | Admitting: Family Medicine

## 2022-09-12 ENCOUNTER — Encounter: Payer: Self-pay | Admitting: Family Medicine

## 2022-09-12 VITALS — BP 138/72 | Temp 99.1°F | Wt 148.0 lb

## 2022-09-12 DIAGNOSIS — J441 Chronic obstructive pulmonary disease with (acute) exacerbation: Secondary | ICD-10-CM

## 2022-09-12 MED ORDER — PREDNISONE 10 MG PO TABS: ORAL_TABLET | ORAL | 0 refills | Status: DC

## 2022-09-12 MED ORDER — AMOXICILLIN-POT CLAVULANATE 875-125 MG PO TABS: 1.0000 | ORAL_TABLET | Freq: Two times a day (BID) | ORAL | 0 refills | Status: DC

## 2022-09-16 DIAGNOSIS — J441 Chronic obstructive pulmonary disease with (acute) exacerbation: Secondary | ICD-10-CM | POA: Insufficient documentation

## 2022-09-16 NOTE — Progress Notes (Signed)
Subjective:  Patient ID: [name], female    DOB: [birth year]  Age: 61 y.o. MRN: [id number]  CC: Chief Complaint  Patient presents with   Cough    Pt has been having cough for a couple of weeks. Mucus is yellow in color in the mornings; other times it does look blood tinged; sometime clear. Pt has been taking Mucinex. Pt was switched to [facility] from Trelegy in July     HPI:  61 year old female with CAD, HTN, COPD presents for evaluation of the above.  Patient reports symptoms for the past 2 weeks. Productive cough with yellow/green sputum. Also reports SOB. Has been using Breztri without significant improvement. Also using OTC mucinex. She reports that she did better on Trelegy. No fever.   Patient Active Problem List   Diagnosis Date Noted   COPD exacerbation ([place]) 09/16/2022   Aortic atherosclerosis ([place]) 07/09/2022   Hematochezia 09/09/2021   GERD (gastroesophageal reflux disease) 08/06/2021   Senile purpura ([place]) 01/21/2021   History of colonic polyps 03/12/2018   Skin cancer 05/11/2017   Hypothyroidism 08/23/2014   COPD (chronic obstructive pulmonary disease) with chronic bronchitis 08/23/2014   Mixed hyperlipidemia 05/15/2009   Essential hypertension, benign 05/15/2009   CAD, NATIVE VESSEL 05/15/2009    Social Hx   Social History   Socioeconomic History   Marital status: Married    Spouse name: Not on file   Number of children: Not on file   Years of education: Not on file   Highest education level: Not on file  Occupational History   Not on file  Tobacco Use   Smoking status: Former    Packs/day: 2.00    Years: 33.00    Total pack years: 66.00    Types: Cigarettes    Start date: 10/27/1972    Quit date: 2007    Years since quitting: 16.9   Smokeless tobacco: Never  Vaping Use   Vaping Use: Never used  Substance and Sexual Activity   Alcohol use: No    Alcohol/week: 0.0 standard drinks of alcohol   Drug use: No   Sexual activity: Not Currently   Other Topics Concern   Not on file  Social History Narrative   Full time. Married. Regularly exercises    Social Determinants of Health   Financial Resource Strain: Low Risk  (11/12/2021)   Overall Financial Resource Strain (CARDIA)    Difficulty of Paying Living Expenses: Not very hard  Food Insecurity: No Food Insecurity (11/12/2021)   Hunger Vital Sign    Worried About Running Out of Food in the Last Year: Never true    Ran Out of Food in the Last Year: Never true  Transportation Needs: No Transportation Needs (11/12/2021)   PRAPARE - Hydrologist (Medical): No    Lack of Transportation (Non-Medical): No  Physical Activity: Insufficiently Active (11/12/2021)   Exercise Vital Sign    Days of Exercise per Week: 4 days    Minutes of Exercise per Session: 30 min  Stress: No Stress Concern Present (11/12/2021)   [place]    Feeling of Stress : Not at all  Social Connections: [place] (11/12/2021)   Social Connection and Isolation Panel [NHANES]    Frequency of Communication with Friends and Family: More than three times a week    Frequency of Social Gatherings with Friends and Family: More than three times a week  Attends Religious Services: 1 to 4 times per year    Active Member of Clubs or Organizations: Yes    Attends Archivist Meetings: 1 to 4 times per year    Marital Status: Married    Review of Systems Per HPI  Objective:  BP 138/72   Temp 99.1 F (37.3 C)   Wt 148 lb (67.1 kg)   SpO2 95%   BMI 27.07 kg/m      09/12/2022    4:09 PM 08/12/2022    3:28 PM 08/07/2022    3:17 PM  BP/Weight  Systolic BP 680 321 224  Diastolic BP 72 72 80  Wt. (Lbs) 148 150.4 150.6  BMI 27.07 kg/m2 27.51 kg/m2 27.55 kg/m2    Physical Exam Vitals and nursing note reviewed.  Constitutional:      General: She is not in acute distress.    Appearance: Normal  appearance.  HENT:     Head: Normocephalic and atraumatic.  Eyes:     General:        Right eye: No discharge.        Left eye: No discharge.     Conjunctiva/sclera: Conjunctivae normal.  Cardiovascular:     Rate and Rhythm: Normal rate and regular rhythm.  Pulmonary:     Effort: Pulmonary effort is normal.     Breath sounds: Wheezing present.  Neurological:     Mental Status: She is alert.     Lab Results  Component Value Date   WBC 5.2 09/08/2022   HGB 14.4 09/08/2022   HCT 42.5 09/08/2022   PLT 237 09/08/2022   GLUCOSE 96 01/01/2022   CHOL 167 07/03/2022   TRIG 142 07/03/2022   HDL 52 07/03/2022   LDLCALC 90 07/03/2022   ALT 13 07/03/2022   AST 15 07/03/2022   NA 148 (H) 01/01/2022   K 4.3 01/01/2022   CL 109 (H) 01/01/2022   CREATININE 0.79 01/01/2022   BUN 11 01/01/2022   CO2 26 01/01/2022   TSH 1.230 09/08/2022   INR 1.1 02/11/2008     Assessment & Plan:   Problem List Items Addressed This Visit       Respiratory   COPD exacerbation ([place]) - Primary    Treating with Augmentin and Prednisone.       Relevant Medications   predniSONE (DELTASONE) 10 MG tablet    Meds ordered this encounter  Medications   amoxicillin-clavulanate (AUGMENTIN) 875-125 MG tablet    Sig: Take 1 tablet by mouth 2 (two) times daily.    Dispense:  14 tablet    Refill:  0   predniSONE (DELTASONE) 10 MG tablet    Sig: 50 mg daily x 2 days, then 40 mg daily x 2 days, then 30 mg daily x 2 days, then 20 mg daily x 2 days, then 10 mg daily x 2 days.    Dispense:  30 tablet    Refill:  0    Follow-up:  Return if symptoms worsen or fail to improve.  [place]

## 2022-09-16 NOTE — Assessment & Plan Note (Signed)
Treating with Augmentin and Prednisone.

## 2022-09-18 DIAGNOSIS — H2513 Age-related nuclear cataract, bilateral: Secondary | ICD-10-CM | POA: Diagnosis not present

## 2022-09-19 ENCOUNTER — Telehealth: Payer: Self-pay | Admitting: *Deleted

## 2022-09-19 NOTE — Telephone Encounter (Signed)
Patient left message on machine requesting a script to restart trellegy on 09/22/22  Patient would like new script sent to [facility] in [place]

## 2022-09-22 ENCOUNTER — Other Ambulatory Visit: Payer: Self-pay | Admitting: Family Medicine

## 2022-09-22 MED ORDER — TRELEGY ELLIPTA 100-62.5-25 MCG/ACT IN AEPB: 1.0000 | INHALATION_SPRAY | Freq: Every day | RESPIRATORY_TRACT | 11 refills | Status: AC

## 2022-09-23 NOTE — Telephone Encounter (Signed)
Cook, Jayce G, DO   ? ?Rx sent.   ? ?

## 2022-09-23 NOTE — Telephone Encounter (Signed)
Patient notified

## 2022-09-26 ENCOUNTER — Telehealth: Payer: Self-pay | Admitting: Cardiology

## 2022-09-26 MED ORDER — NITROGLYCERIN 0.4 MG SL SUBL: 0.4000 mg | SUBLINGUAL_TABLET | SUBLINGUAL | 3 refills | Status: AC | PRN

## 2022-09-26 NOTE — Telephone Encounter (Signed)
*  STAT* If patient is at the pharmacy, call can be transferred to refill team.   1. Which medications need to be refilled? (please list name of each medication and dose if known)  nitroGLYCERIN (NITROSTAT) 0.4 MG SL tablet   2. Which pharmacy/location (including street and city if local pharmacy) is medication to be sent to?  [place], [place]       3. Do they need a 30 day or 90 day supply? [place]

## 2022-10-13 DIAGNOSIS — Z1231 Encounter for screening mammogram for malignant neoplasm of breast: Secondary | ICD-10-CM | POA: Diagnosis not present

## 2022-10-13 LAB — HM MAMMOGRAPHY

## 2022-10-15 DIAGNOSIS — H02411 Mechanical ptosis of right eyelid: Secondary | ICD-10-CM | POA: Diagnosis not present

## 2022-10-15 DIAGNOSIS — H2513 Age-related nuclear cataract, bilateral: Secondary | ICD-10-CM | POA: Diagnosis not present

## 2022-10-22 ENCOUNTER — Encounter: Payer: Self-pay | Admitting: *Deleted

## 2022-11-03 DIAGNOSIS — B078 Other viral warts: Secondary | ICD-10-CM | POA: Diagnosis not present

## 2022-11-04 NOTE — H&P (Signed)
Surgical History & Physical  Patient Name: [name]  DOB: [birth year]  Surgery: Cataract extraction with intraocular lens implant phacoemulsification; Left Eye Surgeon: Ronn Melena MD Surgery Date: 11/14/2022 Pre-Op Date: 10/15/2022  HPI: A 46 Yr. old female patient present for cataract eval per Dr. Hassell Done. 1. The patient complains of difficulty when driving due to glare from headlights or sun, which began 6 months ago. Both eyes are affected. The episode is gradual. The condition's severity is worsening. When reading fine print she uses a magnifier.  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  Review of Systems Negative Allergic/Immunologic Negative Cardiovascular Negative Constitutional Negative Ear, Nose, Mouth & Throat Negative Endocrine Negative Eyes Negative Gastrointestinal Negative Genitourinary Negative Hematologic/Lymphatic Negative Integumentary Negative Musculoskeletal Negative Neurological Negative Psychiatry Negative Respiratory  Social Never Smoked  Medication Albuterol, Trelegy ellipta, Aspirin, Pantoprazole, Pravastatin, Rosuvastatin, Toprol XL, Levothyroxine  Sx/Procedures Rt brow bone tumor removed, replaced with plate,  Hysterectomy  Drug Allergies  NKDA High Blood Pressure LDL Lung Problems Thyroid 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  CATARACT AGE-RELATED NUCLEAR; Both Eyes (H25.13) 3.  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  continue with current glasses for now  2.  Cataracts are visually significant and account for the patient's complaints. Discussed all risks,  benefits, procedures and recovery, including infection, loss of vision and eye, need for glasses after surgery or additional procedures. Patient understands changing glasses will not improve vision. Patient indicated understanding of procedure. All questions answered. Patient desires to have surgery, recommend phacoemulsification with intraocular lens. Patient to have preliminary testing necessary (Argos/IOL Master, Mac OCT, TOPO) Educational materials provided.  Plan: - proceed with surgery OS, followed by OD - RayOne lens, target -0.25 - COPD, ok with lying flat - good dilation - no fuchs, no DM  3.  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 No recurrence but does have mild ptosis.

## 2022-11-07 DIAGNOSIS — H2512 Age-related nuclear cataract, left eye: Secondary | ICD-10-CM | POA: Diagnosis not present

## 2022-11-11 ENCOUNTER — Encounter (HOSPITAL_COMMUNITY): Payer: Self-pay

## 2022-11-11 ENCOUNTER — Other Ambulatory Visit: Payer: Self-pay

## 2022-11-11 ENCOUNTER — Encounter (HOSPITAL_COMMUNITY)
Admission: RE | Admit: 2022-11-11 | Discharge: 2022-11-11 | Disposition: A | Discharge status: Home / Self Care | Payer: Medicare Other | Source: Ambulatory Visit | Attending: Optometry | Admitting: Optometry

## 2022-11-12 DIAGNOSIS — K080 Exfoliation of teeth due to systemic causes: Secondary | ICD-10-CM | POA: Diagnosis not present

## 2022-11-13 ENCOUNTER — Telehealth: Payer: Self-pay | Admitting: Family Medicine

## 2022-11-13 NOTE — Telephone Encounter (Signed)
[place] to schedule their annual wellness visit. Appointment made for 12/05/2022.  Thank you,  Colletta Maryland,  [place] Program Direct Dial ??[phone]

## 2022-11-14 ENCOUNTER — Encounter (HOSPITAL_COMMUNITY)
Admission: RE | Disposition: A | Discharge status: Home / Self Care | Payer: Self-pay | Source: Ambulatory Visit | Attending: Optometry

## 2022-11-14 ENCOUNTER — Ambulatory Visit (HOSPITAL_COMMUNITY): Payer: Medicare Other | Admitting: Anesthesiology

## 2022-11-14 ENCOUNTER — Encounter (HOSPITAL_COMMUNITY): Payer: Self-pay | Admitting: Optometry

## 2022-11-14 ENCOUNTER — Ambulatory Visit (HOSPITAL_COMMUNITY)
Admission: RE | Admit: 2022-11-14 | Discharge: 2022-11-14 | Disposition: A | Discharge status: Home / Self Care | Payer: Medicare Other | Source: Ambulatory Visit | Attending: Optometry | Admitting: Optometry

## 2022-11-14 ENCOUNTER — Ambulatory Visit (HOSPITAL_BASED_OUTPATIENT_CLINIC_OR_DEPARTMENT_OTHER): Payer: Medicare Other | Admitting: Anesthesiology

## 2022-11-14 DIAGNOSIS — Z79899 Other long term (current) drug therapy: Secondary | ICD-10-CM | POA: Diagnosis not present

## 2022-11-14 DIAGNOSIS — H2512 Age-related nuclear cataract, left eye: Secondary | ICD-10-CM | POA: Insufficient documentation

## 2022-11-14 DIAGNOSIS — E039 Hypothyroidism, unspecified: Secondary | ICD-10-CM | POA: Insufficient documentation

## 2022-11-14 DIAGNOSIS — I2510 Atherosclerotic heart disease of native coronary artery without angina pectoris: Secondary | ICD-10-CM | POA: Insufficient documentation

## 2022-11-14 DIAGNOSIS — Z955 Presence of coronary angioplasty implant and graft: Secondary | ICD-10-CM | POA: Diagnosis not present

## 2022-11-14 DIAGNOSIS — I739 Peripheral vascular disease, unspecified: Secondary | ICD-10-CM | POA: Insufficient documentation

## 2022-11-14 DIAGNOSIS — J449 Chronic obstructive pulmonary disease, unspecified: Secondary | ICD-10-CM | POA: Insufficient documentation

## 2022-11-14 DIAGNOSIS — I10 Essential (primary) hypertension: Secondary | ICD-10-CM | POA: Insufficient documentation

## 2022-11-14 DIAGNOSIS — I252 Old myocardial infarction: Secondary | ICD-10-CM | POA: Diagnosis not present

## 2022-11-14 DIAGNOSIS — Z87891 Personal history of nicotine dependence: Secondary | ICD-10-CM | POA: Insufficient documentation

## 2022-11-14 DIAGNOSIS — H25812 Combined forms of age-related cataract, left eye: Secondary | ICD-10-CM

## 2022-11-14 HISTORY — PX: CATARACT EXTRACTION W/PHACO: SHX586

## 2022-11-14 SURGERY — PHACOEMULSIFICATION, CATARACT, WITH IOL INSERTION
Anesthesia: Monitor Anesthesia Care | Site: Eye | Laterality: Left

## 2022-11-14 MED ORDER — BSS IO SOLN
INTRAOCULAR | Status: DC | PRN
  Administered 2022-11-14: 15 mL via INTRAOCULAR

## 2022-11-14 MED ORDER — NEOMYCIN-POLYMYXIN-DEXAMETH 3.5-10000-0.1 OP SUSP
OPHTHALMIC | Status: DC | PRN
  Administered 2022-11-14: 1 [drp] via OPHTHALMIC

## 2022-11-14 MED ORDER — TROPICAMIDE 1 % OP SOLN
1.0000 [drp] | OPHTHALMIC | Status: AC | PRN
  Administered 2022-11-14 (×3): 1 [drp] via OPHTHALMIC

## 2022-11-14 MED ORDER — SODIUM CHLORIDE 0.9% FLUSH
INTRAVENOUS | Status: DC | PRN
  Administered 2022-11-14: 10 mL via INTRAVENOUS

## 2022-11-14 MED ORDER — FENTANYL CITRATE (PF) 100 MCG/2ML IJ SOLN
INTRAMUSCULAR | Status: DC | PRN
  Administered 2022-11-14: 50 ug via INTRAVENOUS

## 2022-11-14 MED ORDER — SIGHTPATH DOSE#1 NA HYALUR & NA CHOND-NA HYALUR IO KIT
PACK | INTRAOCULAR | Status: DC | PRN
  Administered 2022-11-14: 1 via OPHTHALMIC

## 2022-11-14 MED ORDER — PHENYLEPHRINE HCL 2.5 % OP SOLN
1.0000 [drp] | OPHTHALMIC | Status: AC | PRN
  Administered 2022-11-14 (×3): 1 [drp] via OPHTHALMIC

## 2022-11-14 MED ORDER — LIDOCAINE HCL 3.5 % OP GEL: 1.0000 | Freq: Once | OPHTHALMIC | Status: DC

## 2022-11-14 MED ORDER — MIDAZOLAM HCL 2 MG/2ML IJ SOLN
INTRAMUSCULAR | Status: AC
  Filled 2022-11-14: qty 2

## 2022-11-14 MED ORDER — POVIDONE-IODINE 5 % OP SOLN
OPHTHALMIC | Status: DC | PRN
  Administered 2022-11-14: 1 via OPHTHALMIC

## 2022-11-14 MED ORDER — MIDAZOLAM HCL 5 MG/5ML IJ SOLN
INTRAMUSCULAR | Status: DC | PRN
  Administered 2022-11-14: 1 mg via INTRAVENOUS

## 2022-11-14 MED ORDER — LIDOCAINE HCL (PF) 1 % IJ SOLN
INTRAMUSCULAR | Status: DC | PRN
  Administered 2022-11-14: 1 mL

## 2022-11-14 MED ORDER — STERILE WATER FOR IRRIGATION IR SOLN
Status: DC | PRN
  Administered 2022-11-14: 250 mL

## 2022-11-14 MED ORDER — TETRACAINE HCL 0.5 % OP SOLN
1.0000 [drp] | OPHTHALMIC | Status: AC | PRN
  Administered 2022-11-14 (×3): 1 [drp] via OPHTHALMIC

## 2022-11-14 MED ORDER — FENTANYL CITRATE (PF) 100 MCG/2ML IJ SOLN
INTRAMUSCULAR | Status: AC
  Filled 2022-11-14: qty 2

## 2022-11-14 SURGICAL SUPPLY — 16 items
CATARACT SUITE SIGHTPATH (MISCELLANEOUS) ×1 IMPLANT
CLOTH BEACON ORANGE TIMEOUT ST (SAFETY) ×1 IMPLANT
DRSG TEGADERM 4X4.75 (GAUZE/BANDAGES/DRESSINGS) ×1 IMPLANT
EYE SHIELD UNIVERSAL CLEAR (GAUZE/BANDAGES/DRESSINGS) IMPLANT
FEE CATARACT SUITE SIGHTPATH (MISCELLANEOUS) ×1 IMPLANT
GLOVE BIOGEL PI IND STRL 7.0 (GLOVE) ×2 IMPLANT
LENS IOL RAYNER 20.0 (Intraocular Lens) ×1 IMPLANT
LENS IOL RAYONE EMV 20.0 (Intraocular Lens) IMPLANT
NDL HYPO 18GX1.5 BLUNT FILL (NEEDLE) ×1 IMPLANT
NEEDLE HYPO 18GX1.5 BLUNT FILL (NEEDLE) ×1 IMPLANT
PAD ARMBOARD 7.5X6 YLW CONV (MISCELLANEOUS) ×1 IMPLANT
RING MALYGIN 7.0 (MISCELLANEOUS) IMPLANT
SYR TB 1ML LL NO SAFETY (SYRINGE) ×1 IMPLANT
TAPE SURG TRANSPORE 1 IN (GAUZE/BANDAGES/DRESSINGS) IMPLANT
TAPE SURGICAL TRANSPORE 1 IN (GAUZE/BANDAGES/DRESSINGS) ×1
WATER STERILE IRR 250ML POUR (IV SOLUTION) ×1 IMPLANT

## 2022-11-14 NOTE — Transfer of Care (Addendum)
Immediate Anesthesia Transfer of Care Note  Patient: [name]  Procedure(s) Performed: CATARACT EXTRACTION PHACO AND INTRAOCULAR LENS PLACEMENT (IOC) (Left: Eye)  Patient Location: Short Stay  Anesthesia Type:MAC  Level of Consciousness: awake and patient cooperative  Airway & Oxygen Therapy: Patient Spontanous Breathing  Post-op Assessment: Report given to RN and Post -op Vital signs reviewed and stable  Post vital signs: Reviewed and stable  Last Vitals:  Vitals Value Taken Time  BP 113/75 1108  Temp 99.3 1108  Pulse 86 1108  Resp 16 1108  SpO2 98 1108    Last Pain:  Vitals:   11/14/22 1001  TempSrc: Oral  PainSc: 0-No pain      Patients Stated Pain Goal: 7 ([id number])  Complications: No notable events documented.

## 2022-11-14 NOTE — Discharge Instructions (Signed)
Please discharge patient when stable, will follow up today with Dr. Rahil Passey at the [facility] office immediately following discharge.  Leave shield in place until visit.  All paperwork with discharge instructions will be given at the office.  [facility] Address:  [address]  [place],  [address]  Dr. Kelven Flater's Phone: [phone]  

## 2022-11-14 NOTE — Anesthesia Postprocedure Evaluation (Signed)
Anesthesia Post Note  Patient: SERAS KOONS  Procedure(s) Performed: CATARACT EXTRACTION PHACO AND INTRAOCULAR LENS PLACEMENT (IOC) (Left: Eye)  Patient location during evaluation: Phase II Anesthesia Type: MAC Level of consciousness: awake and alert and oriented Pain management: pain level controlled Vital Signs Assessment: post-procedure vital signs reviewed and stable Respiratory status: spontaneous breathing, nonlabored ventilation and respiratory function stable Cardiovascular status: stable and blood pressure returned to baseline Postop Assessment: no apparent nausea or vomiting Anesthetic complications: no  No notable events documented.   Last Vitals:  Vitals:   11/14/22 1001 11/14/22 1108  BP: 124/75 113/75  Pulse:  85  Resp: 19 13  Temp: 37.7 C 37.4 C  SpO2: 95% 97%    Last Pain:  Vitals:   11/14/22 1108  TempSrc: Oral  PainSc: 0-No pain                 Zaccai Chavarin C Keshonda Monsour

## 2022-11-14 NOTE — Anesthesia Procedure Notes (Signed)
Procedure Name: MAC Date/Time: 11/14/2022 10:48 AM  Performed by: Maude Leriche, CRNAPre-anesthesia Checklist: Patient identified, Emergency Drugs available, Suction available, Patient being monitored and Timeout performed Patient Re-evaluated:Patient Re-evaluated prior to induction Oxygen Delivery Method: Nasal cannula Placement Confirmation: positive ETCO2 Dental Injury: Teeth and Oropharynx as per pre-operative assessment

## 2022-11-14 NOTE — Interval H&P Note (Signed)
History and Physical Interval Note:  11/14/2022 10:19 AM  The H and P was reviewed and updated. The patient was examined.  No changes were found after exam.  The surgical eye was marked.  [name]

## 2022-11-14 NOTE — Anesthesia Preprocedure Evaluation (Signed)
Anesthesia Evaluation  Patient identified by MRN, date of birth, ID band Patient awake    Reviewed: Allergy & Precautions, H&P , NPO status , Patient's Chart, lab work & pertinent test results, reviewed documented beta blocker date and time   Airway Mallampati: II  TM Distance: >3 FB Neck ROM: Full    Dental  (+) Dental Advisory Given, Missing   Pulmonary shortness of breath and with exertion, COPD,  COPD inhaler, former smoker   Pulmonary exam normal breath sounds clear to auscultation       Cardiovascular METS3 - Mets hypertension, Pt. on medications and Pt. on home beta blockers + CAD, + Past MI, + Cardiac Stents, + Peripheral Vascular Disease and + DOE  Normal cardiovascular exam Rhythm:Regular Rate:Normal     Neuro/Psych negative neurological ROS  negative psych ROS   GI/Hepatic Neg liver ROS,GERD  Medicated and Controlled,,  Endo/Other  Hypothyroidism    Renal/GU negative Renal ROS  negative genitourinary   Musculoskeletal negative musculoskeletal ROS (+)    Abdominal   Peds negative pediatric ROS (+)  Hematology negative hematology ROS (+)   Anesthesia Other Findings   Reproductive/Obstetrics negative OB ROS                             Anesthesia Physical Anesthesia Plan  ASA: 3  Anesthesia Plan: MAC   Post-op Pain Management: Minimal or no pain anticipated   Induction: Intravenous  PONV Risk Score and Plan: Treatment may vary due to age or medical condition  Airway Management Planned:   Additional Equipment:   Intra-op Plan:   Post-operative Plan:   Informed Consent: I have reviewed the patients History and Physical, chart, labs and discussed the procedure including the risks, benefits and alternatives for the proposed anesthesia with the patient or authorized representative who has indicated his/her understanding and acceptance.     Dental advisory given  Plan  Discussed with: CRNA and Surgeon  Anesthesia Plan Comments:        Anesthesia Quick Evaluation

## 2022-11-14 NOTE — Op Note (Addendum)
Date of procedure: 11/14/22  Pre-operative diagnosis: Visually significant age-related nuclear cataract, Left Eye (H25.12)  Post-operative diagnosis: Visually significant age-related nuclear cataract, Left Eye  Procedure: Removal of cataract via phacoemulsification and insertion of intra-ocular lens Rayner [id number] +20.0D into the capsular bag of the Left Eye  Attending surgeon: Bryon Lions, MD  Anesthesia: MAC, Topical Akten  Complications: None  Estimated Blood Loss: <47m (minimal)  Specimens: None  Implants:  Implant Name Type Inv. Item Serial No. Manufacturer Lot No. LRB No. Used Action  LENS IOL RAYNER 20.0 - S09 Intraocular Lens LENS IOL RAYNER 20.0 09 SIGHTPATH [id number] Implanted    Indications:  Visually significant age-related cataract, Left Eye  Procedure:  The patient was seen and identified in the pre-operative area. The operative eye was identified and dilated.  The operative eye was marked.  Topical anesthesia was administered to the operative eye.     The patient was then to the operative suite and placed in the supine position.  A timeout was performed confirming the patient, procedure to be performed, and all other relevant information.   The patient's face was prepped and draped in the usual fashion for intra-ocular surgery.  A lid speculum was placed into the operative eye and the surgical microscope moved into place and focused.  An inferotemporal paracentesis was created using a 20 gauge paracentesis blade.  BSS mixed with Omidria, followed by 1% lidocaine was injected into the anterior chamber.  Viscoelastic was injected into the anterior chamber.  A temporal clear-corneal main wound incision was created using a 2.467mmicrokeratome.  A continuous curvilinear capsulorrhexis was initiated using an irrigating cystitome and completed using capsulorrhexis forceps.  Hydrodissection and hydrodeliniation were performed.  Viscoelastic was injected into the  anterior chamber.  A phacoemulsification handpiece and a chopper as a second instrument were used to remove the nucleus and epinucleus. The irrigation/aspiration handpiece was used to remove any remaining cortical material.   The capsular bag was reinflated with viscoelastic, checked, and found to be intact.  The intraocular lens was inserted into the capsular bag.  The irrigation/aspiration handpiece was used to remove any remaining viscoelastic.  The clear corneal wound and paracentesis wounds were then hydrated and checked with Weck-Cels to be watertight.  The lid-speculum and drape was removed, and the patient's face was cleaned with a wet and dry 4x4.  One drop of maxitrol was placed on the ocular surface.  A clear shield was taped over the eye. The patient was taken to the post-operative care unit in good condition, having tolerated the procedure well.  Post-Op Instructions: [facility] and will receive all other orders and instructions.

## 2022-11-17 MED ORDER — PHENYLEPHRINE-KETOROLAC 1-0.3 % IO SOLN
INTRAOCULAR | Status: DC | PRN
  Administered 2022-11-14: 500 mL via OPHTHALMIC

## 2022-11-19 ENCOUNTER — Ambulatory Visit (INDEPENDENT_AMBULATORY_CARE_PROVIDER_SITE_OTHER): Payer: Medicare Other | Admitting: Family Medicine

## 2022-11-19 ENCOUNTER — Encounter: Payer: Self-pay | Admitting: Family Medicine

## 2022-11-19 VITALS — BP 122/64 | Temp 97.5°F | Wt 149.0 lb

## 2022-11-19 DIAGNOSIS — J019 Acute sinusitis, unspecified: Secondary | ICD-10-CM | POA: Diagnosis not present

## 2022-11-19 DIAGNOSIS — U071 COVID-19: Secondary | ICD-10-CM

## 2022-11-19 MED ORDER — AMOXICILLIN-POT CLAVULANATE 875-125 MG PO TABS: 1.0000 | ORAL_TABLET | Freq: Two times a day (BID) | ORAL | 0 refills | Status: DC

## 2022-11-19 MED ORDER — PREDNISONE 20 MG PO TABS: ORAL_TABLET | ORAL | 0 refills | Status: DC

## 2022-11-19 NOTE — Progress Notes (Signed)
   Subjective:    Patient ID: [name], female    DOB: 1960/12/16, 62 y.o.   MRN: [id number]  HPI Patient tested positive for COVID. Fever 101.8, cough, and headache. Head congestion drainage denies wheezing difficulty breathing states overall doing improved compared to where she was couple days ago On trelegy   Review of Systems     Objective:   Physical Exam Gen-NAD not toxic TMS-normal bilateral T- normal no redness Chest-CTA respiratory rate normal no crackles CV RRR no murmur Skin-warm dry Neuro-grossly normal        Assessment & Plan:   No respiratory distress Nontoxic Antibiotics prescribed for sinus infection Patient also has COVID infection but she is on the tail end Warning signs discussed follow-up if problems She is at 4-1/2 days of illness it is doubtful Paxlovid would be of benefit

## 2022-11-21 ENCOUNTER — Encounter (HOSPITAL_COMMUNITY): Payer: Self-pay | Admitting: Optometry

## 2022-12-02 NOTE — H&P (Signed)
Surgical History & Physical  Patient Name: [name]  DOB: 12-07-1960  Surgery: Cataract extraction with intraocular lens implant phacoemulsification; Right Eye Surgeon: Ronn Melena MD Surgery Date: 12/12/2022 Pre-Op Date: 11/25/2022  HPI: A 85 Yr. old female patient is here for 5 day Cataract PO-OS. Pre- op OD. Pt. has no complaints with OS. PT. is ready to proceed with surgery for OD. Still having a lot of glare problems due to the cataract OD. Difficulties driving during sunny days and at night because of it. This is negatively affecting the patient's quality of life and the patient is unable to function adequately in life with the current level of vision. HPI was performed by Ronn Melena.  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  CATARACT EXTRACTION STATUS; Left Eye (Z98.42) 2.  INTRAOCULAR LENS IOL ; Left Eye (Z96.1) 3.  CATARACT AGE-RELATED NUCLEAR; Both Eyes (H25.13)  Plan: 1.  POW1.5. Doing well. All post-op precautions discussed and instructions reviewed. Written instructions given.  2. See above  3.  Cataracts are visually significant and account for the patient's complaints. Discussed all risks, benefits, procedures and recovery, including infection, loss of vision and eye, need for glasses after surgery or additional procedures. Patient understands  changing glasses will not improve vision. Patient indicated understanding of procedure. All questions answered. Patient desires to have surgery, recommend phacoemulsification with intraocular lens. Patient to have preliminary testing necessary (Argos/IOL Master, Mac OCT, TOPO) Educational materials provided.  Plan: - proceed with surgery OD - RayOne lens, target -0.25 - COPD, ok with lying flat - good dilation - no fuchs, no DM

## 2022-12-05 ENCOUNTER — Encounter (HOSPITAL_COMMUNITY)
Admission: RE | Admit: 2022-12-05 | Discharge: 2022-12-05 | Disposition: A | Discharge status: Home / Self Care | Payer: Medicare Other | Source: Ambulatory Visit | Attending: Optometry | Admitting: Optometry

## 2022-12-14 ENCOUNTER — Other Ambulatory Visit: Payer: Self-pay | Admitting: Family Medicine

## 2022-12-15 ENCOUNTER — Other Ambulatory Visit: Payer: Self-pay | Admitting: Family Medicine

## 2022-12-15 ENCOUNTER — Telehealth: Payer: Self-pay | Admitting: Family Medicine

## 2022-12-15 MED ORDER — ALBUTEROL SULFATE HFA 108 (90 BASE) MCG/ACT IN AERS: 2.0000 | INHALATION_SPRAY | RESPIRATORY_TRACT | 5 refills | Status: DC | PRN

## 2022-12-15 NOTE — Telephone Encounter (Signed)
Prescription was sent in as requested please notify patient

## 2022-12-15 NOTE — Telephone Encounter (Signed)
Patient is requesting prescription for rescue inhaler be called into [facility]

## 2022-12-16 ENCOUNTER — Other Ambulatory Visit: Payer: Self-pay | Admitting: Family Medicine

## 2022-12-17 ENCOUNTER — Other Ambulatory Visit: Payer: Self-pay | Admitting: *Deleted

## 2022-12-17 MED ORDER — LEVOTHYROXINE SODIUM 125 MCG PO TABS: ORAL_TABLET | ORAL | 0 refills | Status: DC

## 2022-12-25 ENCOUNTER — Telehealth: Payer: Self-pay | Admitting: Family Medicine

## 2022-12-25 NOTE — Telephone Encounter (Signed)
[place] to schedule their annual wellness visit. Appointment made for 01/16/2023.  Thank you,  Colletta [place],  [place] Program Direct Dial ??[phone]

## 2023-01-07 ENCOUNTER — Telehealth: Payer: Self-pay | Admitting: Family Medicine

## 2023-01-07 DIAGNOSIS — Z79899 Other long term (current) drug therapy: Secondary | ICD-10-CM

## 2023-01-07 DIAGNOSIS — E782 Mixed hyperlipidemia: Secondary | ICD-10-CM

## 2023-01-07 NOTE — Telephone Encounter (Signed)
Patient has appointment on 01/21/23 and wanting to know if she needs labs

## 2023-01-08 ENCOUNTER — Ambulatory Visit: Payer: Medicare Other | Admitting: Family Medicine

## 2023-01-12 NOTE — Telephone Encounter (Signed)
Lipid, liver, metabolic 7-hypernatremia hyperlipidemia

## 2023-01-13 ENCOUNTER — Telehealth: Payer: Self-pay | Admitting: Family Medicine

## 2023-01-13 NOTE — Telephone Encounter (Signed)
Blood work ordered in EPIC. Patient notified. 

## 2023-01-13 NOTE — Telephone Encounter (Signed)
Contacted [place] to schedule their annual wellness visit. Appointment made for 01/23/2023.  Thank you,  Judeth Corn[place],  AMB Clinical Support [facility] Program Direct Dial ??[phone]

## 2023-01-19 DIAGNOSIS — Z79899 Other long term (current) drug therapy: Secondary | ICD-10-CM | POA: Diagnosis not present

## 2023-01-19 DIAGNOSIS — E782 Mixed hyperlipidemia: Secondary | ICD-10-CM | POA: Diagnosis not present

## 2023-01-20 LAB — BASIC METABOLIC PANEL
BUN/Creatinine Ratio: 15 (ref 12–28)
BUN: 12 mg/dL (ref 8–27)
CO2: 20 mmol/L (ref 20–29)
Calcium: 9.3 mg/dL (ref 8.7–10.3)
Chloride: 105 mmol/L (ref 96–106)
Creatinine, Ser: 0.78 mg/dL (ref 0.57–1.00)
Glucose: 85 mg/dL (ref 70–99)
Potassium: 4.5 mmol/L (ref 3.5–5.2)
Sodium: 144 mmol/L (ref 134–144)
eGFR: 85 mL/min/{1.73_m2} (ref >59)

## 2023-01-20 LAB — HEPATIC FUNCTION PANEL
ALT: 18 IU/L (ref 0–32)
AST: 23 IU/L (ref 0–40)
Albumin: 4.3 g/dL (ref 3.9–4.9)
Alkaline Phosphatase: 70 IU/L (ref 44–121)
Bilirubin Total: 0.5 mg/dL (ref 0.0–1.2)
Bilirubin, Direct: 0.13 mg/dL (ref 0.00–0.40)
Total Protein: 6.4 g/dL (ref 6.0–8.5)

## 2023-01-20 LAB — LIPID PANEL
Chol/HDL Ratio: 3 ratio (ref 0.0–4.4)
Cholesterol, Total: 159 mg/dL (ref 100–199)
HDL: 53 mg/dL (ref >39)
LDL Chol Calc (NIH): 83 mg/dL (ref 0–99)
Triglycerides: 131 mg/dL (ref 0–149)
VLDL Cholesterol Cal: 23 mg/dL (ref 5–40)

## 2023-01-21 ENCOUNTER — Ambulatory Visit (HOSPITAL_COMMUNITY)
Admission: RE | Admit: 2023-01-21 | Discharge: 2023-01-21 | Disposition: A | Discharge status: Home / Self Care | Payer: Medicare Other | Source: Ambulatory Visit | Attending: Family Medicine | Admitting: Family Medicine

## 2023-01-21 ENCOUNTER — Ambulatory Visit (INDEPENDENT_AMBULATORY_CARE_PROVIDER_SITE_OTHER): Payer: Medicare Other | Admitting: Family Medicine

## 2023-01-21 VITALS — BP 127/70 | Ht 61.0 in | Wt 148.2 lb

## 2023-01-21 DIAGNOSIS — J439 Emphysema, unspecified: Secondary | ICD-10-CM | POA: Diagnosis not present

## 2023-01-21 DIAGNOSIS — E038 Other specified hypothyroidism: Secondary | ICD-10-CM

## 2023-01-21 DIAGNOSIS — J449 Chronic obstructive pulmonary disease, unspecified: Secondary | ICD-10-CM | POA: Diagnosis not present

## 2023-01-21 DIAGNOSIS — J441 Chronic obstructive pulmonary disease with (acute) exacerbation: Secondary | ICD-10-CM | POA: Insufficient documentation

## 2023-01-21 DIAGNOSIS — I2510 Atherosclerotic heart disease of native coronary artery without angina pectoris: Secondary | ICD-10-CM | POA: Diagnosis not present

## 2023-01-21 DIAGNOSIS — Z122 Encounter for screening for malignant neoplasm of respiratory organs: Secondary | ICD-10-CM

## 2023-01-21 DIAGNOSIS — E782 Mixed hyperlipidemia: Secondary | ICD-10-CM

## 2023-01-21 DIAGNOSIS — R0902 Hypoxemia: Secondary | ICD-10-CM

## 2023-01-21 DIAGNOSIS — R059 Cough, unspecified: Secondary | ICD-10-CM | POA: Diagnosis not present

## 2023-01-21 DIAGNOSIS — J9811 Atelectasis: Secondary | ICD-10-CM | POA: Diagnosis not present

## 2023-01-21 DIAGNOSIS — I10 Essential (primary) hypertension: Secondary | ICD-10-CM | POA: Diagnosis not present

## 2023-01-21 DIAGNOSIS — D692 Other nonthrombocytopenic purpura: Secondary | ICD-10-CM

## 2023-01-21 MED ORDER — AMOXICILLIN-POT CLAVULANATE 875-125 MG PO TABS: 1.0000 | ORAL_TABLET | Freq: Two times a day (BID) | ORAL | 0 refills | Status: DC

## 2023-01-21 MED ORDER — PREDNISONE 20 MG PO TABS: ORAL_TABLET | ORAL | 0 refills | Status: DC

## 2023-01-21 MED ORDER — PANTOPRAZOLE SODIUM 40 MG PO TBEC: 40.0000 mg | DELAYED_RELEASE_TABLET | Freq: Every day | ORAL | 1 refills | Status: DC

## 2023-01-21 MED ORDER — LEVOTHYROXINE SODIUM 125 MCG PO TABS: ORAL_TABLET | ORAL | 0 refills | Status: DC

## 2023-01-21 MED ORDER — ROSUVASTATIN CALCIUM 40 MG PO TABS: 40.0000 mg | ORAL_TABLET | Freq: Every day | ORAL | 1 refills | Status: DC

## 2023-01-21 MED ORDER — METOPROLOL SUCCINATE ER 25 MG PO TB24: ORAL_TABLET | ORAL | 3 refills | Status: DC

## 2023-01-21 NOTE — Progress Notes (Signed)
Subjective:    Patient ID: [name], female    DOB: 1960-10-23, 62 y.o.   MRN: [id number]  Hypertension This is a chronic problem. The current episode started more than 1 year ago. Risk factors for coronary artery disease include post-menopausal state.   Patient states she has been having flare of COPD- wonders if she should change metoprolol because she heard it can affect people with breathing problems Patient states she also has Covid in February Patient states she helped mow on Sunday and it really caused it to act up as well  Outpatient Encounter Medications as of 01/21/2023  Medication Sig   albuterol (VENTOLIN HFA) 108 (90 Base) MCG/ACT inhaler Inhale 2 puffs into the lungs every 4 (four) hours as needed for wheezing or shortness of breath.   metoprolol succinate (TOPROL-XL) 25 MG 24 hr tablet 1/2 qd   albuterol (PROVENTIL) (2.5 MG/3ML) 0.083% nebulizer solution USE 1 VIAL IN NEBULIZER EVERY 4 HOURS AS NEEDED FOR SHORTNESS OF BREATH   amoxicillin-clavulanate (AUGMENTIN) 875-125 MG tablet Take 1 tablet by mouth 2 (two) times daily.   aspirin 81 MG EC tablet Take 81 mg by mouth at bedtime.   Cholecalciferol (VITAMIN D3) 400 units CAPS Take 400 Units by mouth at bedtime.    Fluticasone-Umeclidin-Vilant (TRELEGY ELLIPTA) 100-62.5-25 MCG/ACT AEPB Inhale 1 puff into the lungs daily.   levothyroxine (SYNTHROID) 125 MCG tablet 1 qam all days except Mondays  use 1/2 in the am.   Multiple Vitamin (MULTIVITAMIN) tablet Take 1 tablet by mouth daily.   nitroGLYCERIN (NITROSTAT) 0.4 MG SL tablet Place 1 tablet (0.4 mg total) under the tongue every 5 (five) minutes x 3 doses as needed for chest pain (if no relief after 3rd dose, proceed to ED or call 911).   pantoprazole (PROTONIX) 40 MG tablet Take 1 tablet (40 mg total) by mouth daily.   predniSONE (DELTASONE) 20 MG tablet 2 qd for 5 d   rosuvastatin (CRESTOR) 40 MG tablet Take 1 tablet (40 mg total) by mouth daily.   vitamin C (ASCORBIC  ACID) 500 MG tablet Take 500 mg by mouth daily.   [DISCONTINUED] amoxicillin-clavulanate (AUGMENTIN) 875-125 MG tablet Take 1 tablet by mouth 2 (two) times daily.   [DISCONTINUED] levothyroxine (SYNTHROID) 125 MCG tablet 1 qam all days except Mondays  use 1/2 in the am.   [DISCONTINUED] metoprolol tartrate (LOPRESSOR) 25 MG tablet Take 1/2 (one-half) tablet by mouth twice daily   [DISCONTINUED] pantoprazole (PROTONIX) 40 MG tablet Take 1 tablet by mouth once daily   [DISCONTINUED] predniSONE (DELTASONE) 20 MG tablet 2 qd for 5 d   [DISCONTINUED] rosuvastatin (CRESTOR) 20 MG tablet Take 1 tablet (20 mg total) by mouth daily.   Facility-Administered Encounter Medications as of 01/21/2023  Medication   Sightpath dose#1 phenylephrine 1%/ketorolac 0.3% (OMIDRIA) in BSS 500 ML (MUST DILUTE PRIOR TO USE) Optime    COPD exacerbation (HCC) - Plan: DG Chest 2 View  Atherosclerosis of native coronary artery of native heart without angina pectoris  Essential hypertension, benign  Other specified hypothyroidism  Mixed hyperlipidemia  Senile purpura (HCC)  Hypoxia  Results for orders placed or performed in visit on 01/07/23  Lipid panel  Result Value Ref Range   Cholesterol, Total 159 100 - 199 mg/dL   Triglycerides 528 0 - 149 mg/dL   HDL 53 >41 mg/dL   VLDL Cholesterol Cal 23 5 - 40 mg/dL   LDL Chol Calc (NIH) 83 0 - 99 mg/dL   Chol/HDL Ratio  3.0 0.0 - 4.4 ratio  Hepatic function panel  Result Value Ref Range   Total Protein 6.4 6.0 - 8.5 g/dL   Albumin 4.3 3.9 - 4.9 g/dL   Bilirubin Total 0.5 0.0 - 1.2 mg/dL   Bilirubin, Direct 4.09 0.00 - 0.40 mg/dL   Alkaline Phosphatase 70 44 - 121 IU/L   AST 23 0 - 40 IU/L   ALT 18 0 - 32 IU/L  Basic metabolic panel  Result Value Ref Range   Glucose 85 70 - 99 mg/dL   BUN 12 8 - 27 mg/dL   Creatinine, Ser 8.11 0.57 - 1.00 mg/dL   eGFR 85 >91 YN/WGN/5.62   BUN/Creatinine Ratio 15 12 - 28   Sodium 144 134 - 144 mmol/L   Potassium 4.5 3.5 -  5.2 mmol/L   Chloride 105 96 - 106 mmol/L   CO2 20 20 - 29 mmol/L   Calcium 9.3 8.7 - 10.3 mg/dL    Review of Systems     Objective:   Physical Exam General-in no acute distress Eyes-no discharge Lungs-respiratory rate normal, CTA CV-no murmurs,RRR Extremities skin warm dry no edema Neuro grossly normal Behavior normal, alert        Assessment & Plan:  1. COPD exacerbation (HCC) X-ray Antibiotics steroids Continue inhalers Patient not respiratory distress - DG Chest 2 View  2. Atherosclerosis of native coronary artery of native heart without angina pectoris Follow-up with cardiology regular basis she is concerned that metoprolol is causing her lungs to have more shortness of breath we reduced her metoprolol down to a half a tablet of the extended release Continue regular follow-up visits with cardiology 3. Essential hypertension, benign Blood pressure under very good control not orthostatic  4. Other specified hypothyroidism Thyroid good control  5. Mixed hyperlipidemia Goal is to get LDL below 70 Increase Crestor today Follow-up labs in the fall Continue regular follow-up visits with cardiology  6. Senile purpura (HCC) On the arms not severe  7. Hypoxia Hypoxia with effort O2 sat after 3 laps in the office 85% after resting back to 92% Treat the exacerbation follow-up again in 6 weeks Patient has standard CT screening coming up later this month

## 2023-01-22 ENCOUNTER — Encounter: Payer: Self-pay | Admitting: Cardiology

## 2023-01-22 ENCOUNTER — Ambulatory Visit: Payer: Medicare Other | Attending: Cardiology | Admitting: Cardiology

## 2023-01-22 VITALS — BP 122/70 | HR 82 | Ht 61.0 in | Wt 149.8 lb

## 2023-01-22 DIAGNOSIS — I25119 Atherosclerotic heart disease of native coronary artery with unspecified angina pectoris: Secondary | ICD-10-CM

## 2023-01-22 DIAGNOSIS — I10 Essential (primary) hypertension: Secondary | ICD-10-CM

## 2023-01-22 DIAGNOSIS — E782 Mixed hyperlipidemia: Secondary | ICD-10-CM | POA: Diagnosis not present

## 2023-01-22 NOTE — Patient Instructions (Signed)

## 2023-01-22 NOTE — Progress Notes (Signed)
    Cardiology Office Note  Date: 01/22/2023   ID: [name], DOB [birth year], MRN [id number]  History of Present Illness: [name] is a 62 y.o. female last seen in April 2023.  She is here for a routine visit.  She does not report any angina or nitroglycerin use in the interim, has had trouble with shortness of breath during the pollen season, also had COVID earlier this year and has had adjustments in her pulmonary regimen.  No palpitations or syncope.  I reviewed her medications.  Crestor was uptitrated to 40 mg daily most recently.  Her LDL was 83 in April.  Hopefully this will help will get closer to goal.  She otherwise remains on aspirin and Toprol-XL.  I reviewed her ECG today which shows sinus rhythm with nonspecific ST changes.  Physical Exam: VS:  BP 122/70   Pulse 82   Ht 5\' 1"  (1.549 m)   Wt 149 lb 12.8 oz (67.9 kg)   SpO2 91%   BMI 28.30 kg/m , BMI Body mass index is 28.3 kg/m.  Wt Readings from Last 3 Encounters:  01/22/23 149 lb 12.8 oz (67.9 kg)  01/21/23 148 lb 3.2 oz (67.2 kg)  12/04/22 149 lb 4 oz (67.7 kg)    General: Patient appears comfortable at rest. HEENT: Conjunctiva and lids normal. Neck: Supple, no elevated JVP or carotid bruits. Lungs: Decreased breath sounds with scattered rhonchi, prolonged expiratory phase. Cardiac: Regular rate and rhythm, no S3 or significant systolic murmur. Extremities: No pitting edema.  ECG:  An ECG dated 12/24/2021 was personally reviewed today and demonstrated:  Sinus rhythm.  Labwork: 09/08/2022: Hemoglobin 14.4; Platelets 237; TSH 1.230 01/19/2023: ALT 18; AST 23; BUN 12; Creatinine, Ser 0.78; Potassium 4.5; Sodium 144     Component Value Date/Time   CHOL 159 01/19/2023 0838   TRIG 131 01/19/2023 0838   HDL 53 01/19/2023 0838   CHOLHDL 3.0 01/19/2023 0838   CHOLHDL 2.8 03/01/2020 0730   VLDL 22 01/20/2017 0724   LDLCALC 83 01/19/2023 0838   LDLCALC 83 03/01/2020 0730   Other Studies Reviewed  Today:  No interval cardiac testing for review today.  Assessment and Plan:  1.  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  Follow-up Lexiscan Myoview in 2019 was low risk without evidence of ischemia and normal LVEF.  She reports no angina, ECG reviewed and stable.  Plan to continue observation on medical therapy including aspirin, Toprol-XL, Crestor, and as needed nitroglycerin.  2.  Mixed hyperlipidemia.  Recent LDL 83 in April.  Crestor has been increased to 40 mg daily.  Keep follow-up lab work with PCP.  Goal LDL would be 55 or less.  3.  Essential hypertension.  Blood pressure is well-controlled today.  No changes were made.  Disposition:  Follow up  1 year.  Signed, Jonelle Sidle, M.D., F.A.C.C. [facility]

## 2023-01-23 ENCOUNTER — Encounter (HOSPITAL_COMMUNITY)
Admission: RE | Admit: 2023-01-23 | Discharge: 2023-01-23 | Disposition: A | Discharge status: Home / Self Care | Payer: Medicare Other | Source: Ambulatory Visit | Attending: Optometry | Admitting: Optometry

## 2023-01-23 ENCOUNTER — Ambulatory Visit (INDEPENDENT_AMBULATORY_CARE_PROVIDER_SITE_OTHER): Payer: Medicare Other

## 2023-01-23 VITALS — Ht 61.0 in | Wt 148.0 lb

## 2023-01-23 DIAGNOSIS — Z0000 Encounter for general adult medical examination without abnormal findings: Secondary | ICD-10-CM

## 2023-01-23 DIAGNOSIS — H2511 Age-related nuclear cataract, right eye: Secondary | ICD-10-CM | POA: Diagnosis not present

## 2023-01-23 NOTE — Patient Instructions (Signed)
[name] , Thank you for taking time to come for your Medicare Wellness Visit. I appreciate your ongoing commitment to your health goals. Please review the following plan we discussed and let me know if I can assist you in the future.   These are the goals we discussed:  Goals      Exercise 3x per week (30 min per time)        This is a list of the screening recommended for you and due dates:  Health Maintenance  Topic Date Due   Zoster (Shingles) Vaccine (1 of 2) Never done   COVID-19 Vaccine (3 - Moderna risk series) 08/08/2021   Flu Shot  04/23/2023   Medicare Annual Wellness Visit  01/23/2024   Mammogram  10/13/2024   Colon Cancer Screening  10/16/2024   DTaP/Tdap/Td vaccine (3 - Tdap) 05/15/2031   Hepatitis C Screening: USPSTF Recommendation to screen - Ages 18-79 yo.  Completed   HIV Screening  Completed   HPV Vaccine  Aged Out    Advanced directives: Please bring a copy of your health care power of attorney and living will to the office to be added to your chart at your convenience.   Conditions/risks identified: Aim for 30 minutes of exercise or brisk walking, 6-8 glasses of water, and 5 servings of fruits and vegetables each day.   Next appointment: Follow up in one year for your annual wellness visit.   Preventive Care 40-64 Years, Female Preventive care refers to lifestyle choices and visits with your health care provider that can promote health and wellness. What does preventive care include? A yearly physical exam. This is also called an annual well check. Dental exams once or twice a year. Routine eye exams. Ask your health care provider how often you should have your eyes checked. Personal lifestyle choices, including: Daily care of your teeth and gums. Regular physical activity. Eating a healthy diet. Avoiding tobacco and drug use. Limiting alcohol use. Practicing safe sex. Taking low-dose aspirin daily starting at age 75. Taking vitamin and mineral  supplements as recommended by your health care provider. What happens during an annual well check? The services and screenings done by your health care provider during your annual well check will depend on your age, overall health, lifestyle risk factors, and family history of disease. Counseling  Your health care provider may ask you questions about your: Alcohol use. Tobacco use. Drug use. Emotional well-being. Home and relationship well-being. Sexual activity. Eating habits. Work and work Astronomer. Method of birth control. Menstrual cycle. Pregnancy history. Screening  You may have the following tests or measurements: Height, weight, and BMI. Blood pressure. Lipid and cholesterol levels. These may be checked every 5 years, or more frequently if you are over 58 years old. Skin check. Lung cancer screening. You may have this screening every year starting at age 107 if you have a 30-pack-year history of smoking and currently smoke or have quit within the past 15 years. Fecal occult blood test (FOBT) of the stool. You may have this test every year starting at age 60. Flexible sigmoidoscopy or colonoscopy. You may have a sigmoidoscopy every 5 years or a colonoscopy every 10 years starting at age 71. Hepatitis C blood test. Hepatitis B blood test. Sexually transmitted disease (STD) testing. Diabetes screening. This is done by checking your blood sugar (glucose) after you have not eaten for a while (fasting). You may have this done every 1-3 years. Mammogram. This may be done every 1-2  years. Talk to your health care provider about when you should start having regular mammograms. This may depend on whether you have a family history of breast cancer. BRCA-related cancer screening. This may be done if you have a family history of breast, ovarian, tubal, or peritoneal cancers. Pelvic exam and Pap test. This may be done every 3 years starting at age 83. Starting at age 56, this may be done  every 5 years if you have a Pap test in combination with an HPV test. Bone density scan. This is done to screen for osteoporosis. You may have this scan if you are at high risk for osteoporosis. Discuss your test results, treatment options, and if necessary, the need for more tests with your health care provider. Vaccines  Your health care provider may recommend certain vaccines, such as: Influenza vaccine. This is recommended every year. Tetanus, diphtheria, and acellular pertussis (Tdap, Td) vaccine. You may need a Td booster every 10 years. Zoster vaccine. You may need this after age 39. Pneumococcal 13-valent conjugate (PCV13) vaccine. You may need this if you have certain conditions and were not previously vaccinated. Pneumococcal polysaccharide (PPSV23) vaccine. You may need one or two doses if you smoke cigarettes or if you have certain conditions. Talk to your health care provider about which screenings and vaccines you need and how often you need them. This information is not intended to replace advice given to you by your health care provider. Make sure you discuss any questions you have with your health care provider. Document Released: 10/05/2015 Document Revised: 05/28/2016 Document Reviewed: 07/10/2015 Elsevier Interactive Patient Education  2017 [place] Prevention in the Home Falls can cause injuries. They can happen to people of all ages. There are many things you can do to make your home safe and to help prevent falls. What can I do on the outside of my home? Regularly fix the edges of walkways and driveways and fix any cracks. Remove anything that might make you trip as you walk through a door, such as a raised step or threshold. Trim any bushes or trees on the path to your home. Use bright outdoor lighting. Clear any walking paths of anything that might make someone trip, such as rocks or tools. Regularly check to see if handrails are loose or broken. Make  sure that both sides of any steps have handrails. Any raised decks and porches should have guardrails on the edges. Have any leaves, snow, or ice cleared regularly. Use sand or salt on walking paths during winter. Clean up any spills in your garage right away. This includes oil or grease spills. What can I do in the bathroom? Use night lights. Install grab bars by the toilet and in the tub and shower. Do not use towel bars as grab bars. Use non-skid mats or decals in the tub or shower. If you need to sit down in the shower, use a plastic, non-slip stool. Keep the floor dry. Clean up any water that spills on the floor as soon as it happens. Remove soap buildup in the tub or shower regularly. Attach bath mats securely with double-sided non-slip rug tape. Do not have throw rugs and other things on the floor that can make you trip. What can I do in the bedroom? Use night lights. Make sure that you have a light by your bed that is easy to reach. Do not use any sheets or blankets that are too big for your bed. They  should not hang down onto the floor. Have a firm chair that has side arms. You can use this for support while you get dressed. Do not have throw rugs and other things on the floor that can make you trip. What can I do in the kitchen? Clean up any spills right away. Avoid walking on wet floors. Keep items that you use a lot in easy-to-reach places. If you need to reach something above you, use a strong step stool that has a grab bar. Keep electrical cords out of the way. Do not use floor polish or wax that makes floors slippery. If you must use wax, use non-skid floor wax. Do not have throw rugs and other things on the floor that can make you trip. What can I do with my stairs? Do not leave any items on the stairs. Make sure that there are handrails on both sides of the stairs and use them. Fix handrails that are broken or loose. Make sure that handrails are as long as the  stairways. Check any carpeting to make sure that it is firmly attached to the stairs. Fix any carpet that is loose or worn. Avoid having throw rugs at the top or bottom of the stairs. If you do have throw rugs, attach them to the floor with carpet tape. Make sure that you have a light switch at the top of the stairs and the bottom of the stairs. If you do not have them, ask someone to add them for you. What else can I do to help prevent falls? Wear shoes that: Do not have high heels. Have rubber bottoms. Are comfortable and fit you well. Are closed at the toe. Do not wear sandals. If you use a stepladder: Make sure that it is fully opened. Do not climb a closed stepladder. Make sure that both sides of the stepladder are locked into place. Ask someone to hold it for you, if possible. Clearly mark and make sure that you can see: Any grab bars or handrails. First and last steps. Where the edge of each step is. Use tools that help you move around (mobility aids) if they are needed. These include: Canes. Walkers. Scooters. Crutches. Turn on the lights when you go into a dark area. Replace any light bulbs as soon as they burn out. Set up your furniture so you have a clear path. Avoid moving your furniture around. If any of your floors are uneven, fix them. If there are any pets around you, be aware of where they are. Review your medicines with your doctor. Some medicines can make you feel dizzy. This can increase your chance of falling. Ask your doctor what other things that you can do to help prevent falls. This information is not intended to replace advice given to you by your health care provider. Make sure you discuss any questions you have with your health care provider. Document Released: 07/05/2009 Document Revised: 02/14/2016 Document Reviewed: 10/13/2014 Elsevier Interactive Patient Education  2017 [employer].

## 2023-01-23 NOTE — Progress Notes (Signed)
Subjective:   [name] is a 62 y.o. female who presents for Medicare Annual (Subsequent) preventive examination.  I connected with  Royston Cowper on 01/23/23 by a audio enabled telemedicine application and verified that I am speaking with the correct person using two identifiers.  Patient Location: Home  Provider Location: Home Office  I discussed the limitations of evaluation and management by telemedicine. The patient expressed understanding and agreed to proceed.  Review of Systems     Cardiac Risk Factors include: dyslipidemia;hypertension     Objective:    Today's Vitals   01/23/23 0918  Weight: 148 lb (67.1 kg)  Height: 5\' 1"  (1.549 m)   Body mass index is 27.96 kg/m.     01/23/2023    9:28 AM 11/11/2022    1:57 PM 11/12/2021    9:42 AM 10/16/2021    8:32 AM 08/28/2021   12:53 PM 06/21/2018    2:28 PM 05/26/2018    9:46 AM  Advanced Directives  Does Patient Have a Medical Advance Directive? No No No No No No No  Would patient like information on creating a medical advance directive? No - Patient declined No - Patient declined No - Patient declined No - Patient declined Yes (MAU/Ambulatory/Procedural Areas - Information given) No - Patient declined No - Patient declined    Current Medications (verified) Outpatient Encounter Medications as of 01/23/2023  Medication Sig   albuterol (PROVENTIL) (2.5 MG/3ML) 0.083% nebulizer solution USE 1 VIAL IN NEBULIZER EVERY 4 HOURS AS NEEDED FOR SHORTNESS OF BREATH   albuterol (VENTOLIN HFA) 108 (90 Base) MCG/ACT inhaler Inhale 2 puffs into the lungs every 4 (four) hours as needed for wheezing or shortness of breath.   amoxicillin-clavulanate (AUGMENTIN) 875-125 MG tablet Take 1 tablet by mouth 2 (two) times daily.   aspirin 81 MG EC tablet Take 81 mg by mouth at bedtime.   Cholecalciferol (VITAMIN D3) 400 units CAPS Take 400 Units by mouth at bedtime.    Fluticasone-Umeclidin-Vilant (TRELEGY ELLIPTA) 100-62.5-25 MCG/ACT  AEPB Inhale 1 puff into the lungs daily.   levothyroxine (SYNTHROID) 125 MCG tablet 1 qam all days except Mondays  use 1/2 in the am.   metoprolol succinate (TOPROL-XL) 25 MG 24 hr tablet 1/2 qd   Multiple Vitamin (MULTIVITAMIN) tablet Take 1 tablet by mouth daily.   nitroGLYCERIN (NITROSTAT) 0.4 MG SL tablet Place 1 tablet (0.4 mg total) under the tongue every 5 (five) minutes x 3 doses as needed for chest pain (if no relief after 3rd dose, proceed to ED or call 911).   pantoprazole (PROTONIX) 40 MG tablet Take 1 tablet (40 mg total) by mouth daily.   predniSONE (DELTASONE) 20 MG tablet 2 qd for 5 d   rosuvastatin (CRESTOR) 40 MG tablet Take 1 tablet (40 mg total) by mouth daily.   vitamin C (ASCORBIC ACID) 500 MG tablet Take 500 mg by mouth daily.   Facility-Administered Encounter Medications as of 01/23/2023  Medication   Sightpath dose#1 phenylephrine 1%/ketorolac 0.3% (OMIDRIA) in BSS 500 ML (MUST DILUTE PRIOR TO USE) Optime    Allergies (verified) Patient has no known allergies.   History: Past Medical History:  Diagnosis Date   COPD (chronic obstructive pulmonary disease) (HCC)    Coronary atherosclerosis of native coronary artery    DES RCA 5/09, nonobstructive left system   Essential hypertension    Hypothyroidism    Mixed hyperlipidemia    NSTEMI (non-ST elevated myocardial infarction) (HCC)    2009  Past Surgical History:  Procedure Laterality Date   BREAST LUMPECTOMY Right    CATARACT EXTRACTION W/PHACO Left 11/14/2022   Procedure: CATARACT EXTRACTION PHACO AND INTRAOCULAR LENS PLACEMENT (IOC);  Surgeon: Pecolia Ades, MD;  Location: [facility];  Service: Ophthalmology;  Laterality: Left;  CDE: 6.04   COLONOSCOPY N/A 05/26/2018   Procedure: COLONOSCOPY;  Surgeon: Malissa Hippo, MD;  Location: [facility];  Service: Endoscopy;  Laterality: N/A;  1030   COLONOSCOPY WITH PROPOFOL N/A 10/16/2021   Procedure: COLONOSCOPY WITH PROPOFOL;  Surgeon: Dolores Frame, MD;  Location: [facility];  Service: Gastroenterology;  Laterality: N/A;  10:05   POLYPECTOMY  05/26/2018   Procedure: POLYPECTOMY;  Surgeon: Malissa Hippo, MD;  Location: [facility];  Service: Endoscopy;;  colon   POLYPECTOMY  10/16/2021   Procedure: POLYPECTOMY;  Surgeon: Dolores Frame, MD;  Location: [facility];  Service: Gastroenterology;;   TOTAL ABDOMINAL HYSTERECTOMY     TUMOR REMOVAL Right    over right eye and has a plastic plate there.   Family History  Problem Relation Age of Onset   Pneumonia Brother        age 39   Cancer Brother        larynx - age 86   Pneumonia Brother        declining health - age 16   Social History   Socioeconomic History   Marital status: Married    Spouse name: Not on file   Number of children: Not on file   Years of education: Not on file   Highest education level: Not on file  Occupational History   Not on file  Tobacco Use   Smoking status: Former    Packs/day: 2.00    Years: 33.00    Additional pack years: 0.00    Total pack years: 66.00    Types: Cigarettes    Start date: 10/27/1972    Quit date: 2007    Years since quitting: 17.3   Smokeless tobacco: Never  Vaping Use   Vaping Use: Never used  Substance and Sexual Activity   Alcohol use: No    Alcohol/week: 0.0 standard drinks of alcohol   Drug use: No   Sexual activity: Not Currently  Other Topics Concern   Not on file  Social History Narrative   Full time. Married. Regularly exercises    Social Determinants of Health   Financial Resource Strain: Low Risk  (01/23/2023)   Overall Financial Resource Strain (CARDIA)    Difficulty of Paying Living Expenses: Not very hard  Food Insecurity: No Food Insecurity (01/23/2023)   Hunger Vital Sign    Worried About Running Out of Food in the Last Year: Never true    Ran Out of Food in the Last Year: Never true  Transportation Needs: No Transportation Needs (01/23/2023)   PRAPARE - Therapist, art (Medical): No    Lack of Transportation (Non-Medical): No  Physical Activity: Sufficiently Active (01/23/2023)   Exercise Vital Sign    Days of Exercise per Week: 5 days    Minutes of Exercise per Session: 30 min  Stress: No Stress Concern Present (01/23/2023)   [employer] of Occupational Health - Occupational Stress Questionnaire    Feeling of Stress : Not at all  Social Connections: Socially Integrated (01/23/2023)   Social Connection and Isolation Panel [NHANES]    Frequency of Communication with Friends and Family: More than three times a  week    Frequency of Social Gatherings with Friends and Family: More than three times a week    Attends Religious Services: More than 4 times per year    Active Member of [employer] or Organizations: Yes    Attends Engineer, structural: More than 4 times per year    Marital Status: Married    Tobacco Counseling Counseling given: Not Answered   Clinical Intake:  Pre-visit preparation completed: Yes  Pain : No/denies pain  Diabetes: No  How often do you need to have someone help you when you read instructions, pamphlets, or other written materials from your doctor or pharmacy?: 1 - Never  Diabetic?No   Interpreter Needed?: No  Information entered by :: Kandis Fantasia LPN   Activities of Daily Living    01/23/2023    9:27 AM 11/11/2022    1:59 PM  In your present state of health, do you have any difficulty performing the following activities:  Hearing? 0   Vision? 0   Difficulty concentrating or making decisions? 0   Walking or climbing stairs? 0   Dressing or bathing? 0   Doing errands, shopping? 0 0  Preparing Food and eating ? N   Using the Toilet? N   In the past six months, have you accidently leaked urine? N   Do you have problems with loss of bowel control? N   Managing your Medications? N   Managing your Finances? N   Housekeeping or managing your Housekeeping? N     Patient Care  Team: Babs Sciara, MD as PCP - General (Family Medicine) Jonelle Sidle, MD as PCP - Cardiology (Cardiology) Ernestina Penna, MD as Obstetrician (Unknown Physician Specialty) [facility]  Indicate any recent Medical Services you may have received from other than [facility] providers in the past year (date may be approximate).     Assessment:   This is a routine wellness examination for [place].  Hearing/Vision screen Hearing Screening - Comments:: Denies hearing difficulties   Vision Screening - Comments:: Wears rx glasses - up to date with routine eye exams with [name]    Dietary issues and exercise activities discussed: Current Exercise Habits: Home exercise routine, Type of exercise: walking, Time (Minutes): 30, Frequency (Times/Week): 5, Weekly Exercise (Minutes/Week): 150, Intensity: Mild, Exercise limited by: respiratory conditions(s)   Goals Addressed             This Visit's Progress    Exercise 3x per week (30 min per time)   On track     Depression Screen    01/23/2023    9:25 AM 01/21/2023    9:20 AM 07/09/2022    1:10 PM 11/12/2021    9:38 AM 08/28/2021    1:05 PM 08/28/2021   12:53 PM 01/21/2021    8:24 AM  PHQ 2/9 Scores  PHQ - 2 Score 0 0 0 0 0 0 0    Fall Risk    01/23/2023    9:27 AM 01/21/2023    9:20 AM 07/09/2022    1:09 PM 11/12/2021    9:43 AM 08/28/2021   12:53 PM  Fall Risk   Falls in the past year? 0 0 0 0 0  Number falls in past yr: 0  0 0   Injury with Fall? 0  0 0   Risk for fall due to : No Fall Risks No Fall Risks No Fall Risks No Fall Risks   Follow up  Falls prevention discussed;Education provided;Falls evaluation completed Falls evaluation completed Falls evaluation completed Falls prevention discussed     FALL RISK PREVENTION PERTAINING TO THE HOME:  Any stairs in or around the home? No  If so, are there any without handrails? No  Home free of loose throw rugs in walkways, pet beds, electrical  cords, etc? Yes  Adequate lighting in your home to reduce risk of falls? Yes   ASSISTIVE DEVICES UTILIZED TO PREVENT FALLS:  Life alert? No  Use of a cane, walker or w/c? No  Grab bars in the bathroom? Yes  Shower chair or bench in shower? No  Elevated toilet seat or a handicapped toilet? Yes   TIMED UP AND GO:  Was the test performed? No . Telephonic visit   Cognitive Function:        01/23/2023    9:28 AM 11/12/2021    9:46 AM  6CIT Screen  What Year? 0 points 0 points  What month? 0 points 0 points  What time? 0 points 0 points  Count back from 20 0 points 0 points  Months in reverse 0 points 0 points  Repeat phrase 0 points 0 points  Total Score 0 points 0 points    Immunizations Immunization History  Administered Date(s) Administered   Influenza,inj,Quad PF,6+ Mos 07/27/2014, 07/09/2015, 07/21/2016, 07/21/2018, 07/23/2020, 08/06/2021, 07/09/2022   Influenza,inj,quad, With Preservative 08/17/2017, 06/14/2019   Influenza-Unspecified 08/17/2017, 06/14/2019   Moderna SARS-COV2 Booster Vaccination 07/11/2021   Moderna Sars-Covid-2 Vaccination 12/14/2019, 01/09/2020   Pneumococcal Conjugate-13 08/28/2015   Pneumococcal Polysaccharide-23 08/23/2014   Td 06/10/2018, 05/14/2021    TDAP status: Up to date  Flu Vaccine status: Up to date  Pneumococcal vaccine status: Up to date  Covid-19 vaccine status: Information provided on how to obtain vaccines.   Qualifies for Shingles Vaccine? Yes   Zostavax completed No   Shingrix Completed?: No.    Education has been provided regarding the importance of this vaccine. Patient has been advised to call insurance company to determine out of pocket expense if they have not yet received this vaccine. Advised may also receive vaccine at local pharmacy or Health Dept. Verbalized acceptance and understanding.  Screening Tests Health Maintenance  Topic Date Due   Zoster Vaccines- Shingrix (1 of 2) Never done   COVID-19 Vaccine (3 -  Moderna risk series) 08/08/2021   INFLUENZA VACCINE  04/23/2023   Medicare Annual Wellness (AWV)  01/23/2024   MAMMOGRAM  10/13/2024   COLONOSCOPY (Pts 45-50yrs Insurance coverage will need to be confirmed)  10/16/2024   DTaP/Tdap/Td (3 - Tdap) 05/15/2031   Hepatitis C Screening  Completed   HIV Screening  Completed   HPV VACCINES  Aged Out    Health Maintenance  Health Maintenance Due  Topic Date Due   Zoster Vaccines- Shingrix (1 of 2) Never done   COVID-19 Vaccine (3 - Moderna risk series) 08/08/2021    Colorectal cancer screening: Type of screening: Colonoscopy. Completed 10/16/21. Repeat every 3 years  Mammogram status: Completed 10/13/22. Repeat every year  Lung Cancer Screening: (Low Dose CT Chest recommended if Age 51-80 years, 30 pack-year currently smoking OR have quit w/in 15years.) does not qualify.   Lung Cancer Screening Referral: n/a  Additional Screening:  Hepatitis C Screening: does qualify; Completed 07/22/17  Vision Screening: Recommended annual ophthalmology exams for early detection of glaucoma and other disorders of the eye. Is the patient up to date with their annual eye exam?  Yes  Who is the provider or  what is the name of the office in which the patient attends annual eye exams? [name]  If pt is not established with a provider, would they like to be referred to a provider to establish care? No .   Dental Screening: Recommended annual dental exams for proper oral hygiene  Community Resource Referral / Chronic Care Management: CRR required this visit?  No   CCM required this visit?  No      Plan:     I have personally reviewed and noted the following in the patient's chart:   Medical and social history Use of alcohol, tobacco or illicit drugs  Current medications and supplements including opioid prescriptions. Patient is not currently taking opioid prescriptions. Functional ability and status Nutritional status Physical  activity Advanced directives List of other physicians Hospitalizations, surgeries, and ER visits in previous 12 months Vitals Screenings to include cognitive, depression, and falls Referrals and appointments  In addition, I have reviewed and discussed with patient certain preventive protocols, quality metrics, and best practice recommendations. A written personalized care plan for preventive services as well as general preventive health recommendations were provided to patient.     Durwin Nora, [place]   12/25/4096   Due to this being a virtual visit, the after visit summary with patients personalized plan was offered to patient via mail or my-chart. per request, patient was mailed a copy of AVS  Nurse Notes: No concerns

## 2023-01-30 ENCOUNTER — Ambulatory Visit (HOSPITAL_COMMUNITY): Payer: Medicare Other | Admitting: Certified Registered"

## 2023-01-30 ENCOUNTER — Encounter (HOSPITAL_COMMUNITY)
Admission: RE | Disposition: A | Discharge status: Home / Self Care | Payer: Self-pay | Source: Home / Self Care | Attending: Optometry

## 2023-01-30 ENCOUNTER — Ambulatory Visit (HOSPITAL_BASED_OUTPATIENT_CLINIC_OR_DEPARTMENT_OTHER): Payer: Medicare Other | Admitting: Certified Registered"

## 2023-01-30 ENCOUNTER — Encounter (HOSPITAL_COMMUNITY): Payer: Self-pay | Admitting: Optometry

## 2023-01-30 ENCOUNTER — Ambulatory Visit (HOSPITAL_COMMUNITY)
Admission: RE | Admit: 2023-01-30 | Discharge: 2023-01-30 | Disposition: A | Discharge status: Home / Self Care | Payer: Medicare Other | Attending: Optometry | Admitting: Optometry

## 2023-01-30 DIAGNOSIS — I252 Old myocardial infarction: Secondary | ICD-10-CM | POA: Diagnosis not present

## 2023-01-30 DIAGNOSIS — I2510 Atherosclerotic heart disease of native coronary artery without angina pectoris: Secondary | ICD-10-CM | POA: Insufficient documentation

## 2023-01-30 DIAGNOSIS — I739 Peripheral vascular disease, unspecified: Secondary | ICD-10-CM | POA: Insufficient documentation

## 2023-01-30 DIAGNOSIS — H2511 Age-related nuclear cataract, right eye: Secondary | ICD-10-CM | POA: Insufficient documentation

## 2023-01-30 DIAGNOSIS — Z955 Presence of coronary angioplasty implant and graft: Secondary | ICD-10-CM | POA: Diagnosis not present

## 2023-01-30 DIAGNOSIS — Z87891 Personal history of nicotine dependence: Secondary | ICD-10-CM

## 2023-01-30 DIAGNOSIS — Z79899 Other long term (current) drug therapy: Secondary | ICD-10-CM | POA: Diagnosis not present

## 2023-01-30 DIAGNOSIS — Z7951 Long term (current) use of inhaled steroids: Secondary | ICD-10-CM | POA: Diagnosis not present

## 2023-01-30 DIAGNOSIS — I10 Essential (primary) hypertension: Secondary | ICD-10-CM | POA: Diagnosis not present

## 2023-01-30 DIAGNOSIS — Z961 Presence of intraocular lens: Secondary | ICD-10-CM | POA: Insufficient documentation

## 2023-01-30 DIAGNOSIS — E039 Hypothyroidism, unspecified: Secondary | ICD-10-CM | POA: Diagnosis not present

## 2023-01-30 DIAGNOSIS — K219 Gastro-esophageal reflux disease without esophagitis: Secondary | ICD-10-CM | POA: Insufficient documentation

## 2023-01-30 DIAGNOSIS — J449 Chronic obstructive pulmonary disease, unspecified: Secondary | ICD-10-CM | POA: Insufficient documentation

## 2023-01-30 DIAGNOSIS — Z9842 Cataract extraction status, left eye: Secondary | ICD-10-CM | POA: Insufficient documentation

## 2023-01-30 HISTORY — PX: CATARACT EXTRACTION W/PHACO: SHX586

## 2023-01-30 SURGERY — PHACOEMULSIFICATION, CATARACT, WITH IOL INSERTION
Anesthesia: Monitor Anesthesia Care | Site: Eye | Laterality: Right

## 2023-01-30 MED ORDER — LIDOCAINE HCL (PF) 1 % IJ SOLN
INTRAMUSCULAR | Status: DC | PRN
  Administered 2023-01-30: 2 mL

## 2023-01-30 MED ORDER — LACTATED RINGERS IV SOLN: INTRAVENOUS | Status: DC

## 2023-01-30 MED ORDER — SIGHTPATH DOSE#1 NA HYALUR & NA CHOND-NA HYALUR IO KIT
PACK | INTRAOCULAR | Status: DC | PRN
  Administered 2023-01-30: 1 via OPHTHALMIC

## 2023-01-30 MED ORDER — LIDOCAINE HCL 3.5 % OP GEL
1.0000 | Freq: Once | OPHTHALMIC | Status: AC
  Administered 2023-01-30: 1 via OPHTHALMIC

## 2023-01-30 MED ORDER — STERILE WATER FOR IRRIGATION IR SOLN
Status: DC | PRN
  Administered 2023-01-30: 250 mL

## 2023-01-30 MED ORDER — PHENYLEPHRINE HCL 2.5 % OP SOLN
1.0000 [drp] | OPHTHALMIC | Status: AC
  Administered 2023-01-30 (×3): 1 [drp] via OPHTHALMIC

## 2023-01-30 MED ORDER — TROPICAMIDE 1 % OP SOLN
1.0000 [drp] | OPHTHALMIC | Status: AC
  Administered 2023-01-30 (×3): 1 [drp] via OPHTHALMIC

## 2023-01-30 MED ORDER — NEOMYCIN-POLYMYXIN-DEXAMETH 3.5-10000-0.1 OP SUSP
OPHTHALMIC | Status: DC | PRN
  Administered 2023-01-30: 2 [drp] via OPHTHALMIC

## 2023-01-30 MED ORDER — MIDAZOLAM HCL 2 MG/2ML IJ SOLN
INTRAMUSCULAR | Status: AC
  Filled 2023-01-30: qty 2

## 2023-01-30 MED ORDER — TETRACAINE HCL 0.5 % OP SOLN
1.0000 [drp] | OPHTHALMIC | Status: AC
  Administered 2023-01-30 (×3): 1 [drp] via OPHTHALMIC

## 2023-01-30 MED ORDER — PHENYLEPHRINE-KETOROLAC 1-0.3 % IO SOLN
INTRAOCULAR | Status: DC | PRN
  Administered 2023-01-30: 500 mL via OPHTHALMIC

## 2023-01-30 MED ORDER — FENTANYL CITRATE (PF) 100 MCG/2ML IJ SOLN
INTRAMUSCULAR | Status: AC
  Filled 2023-01-30: qty 2

## 2023-01-30 MED ORDER — MIDAZOLAM HCL 2 MG/2ML IJ SOLN
INTRAMUSCULAR | Status: DC | PRN
  Administered 2023-01-30: 1 mg via INTRAVENOUS

## 2023-01-30 MED ORDER — POVIDONE-IODINE 5 % OP SOLN
OPHTHALMIC | Status: DC | PRN
  Administered 2023-01-30: 1 via OPHTHALMIC

## 2023-01-30 MED ORDER — BSS IO SOLN
INTRAOCULAR | Status: DC | PRN
  Administered 2023-01-30: 15 mL via INTRAOCULAR

## 2023-01-30 MED ORDER — FENTANYL CITRATE (PF) 100 MCG/2ML IJ SOLN
INTRAMUSCULAR | Status: DC | PRN
  Administered 2023-01-30: 50 ug via INTRAVENOUS

## 2023-01-30 MED ORDER — SODIUM CHLORIDE 0.9% FLUSH
INTRAVENOUS | Status: DC | PRN
  Administered 2023-01-30: 10 mL via INTRAVENOUS

## 2023-01-30 SURGICAL SUPPLY — 15 items
CATARACT SUITE SIGHTPATH (MISCELLANEOUS) ×1 IMPLANT
CLOTH BEACON ORANGE TIMEOUT ST (SAFETY) ×1 IMPLANT
DRSG TEGADERM 4X4.75 (GAUZE/BANDAGES/DRESSINGS) ×1 IMPLANT
EYE SHIELD UNIVERSAL CLEAR (GAUZE/BANDAGES/DRESSINGS) IMPLANT
FEE CATARACT SUITE SIGHTPATH (MISCELLANEOUS) ×1 IMPLANT
GLOVE BIOGEL PI IND STRL 7.0 (GLOVE) ×2 IMPLANT
LENS IOL RAYNER 21.0 (Intraocular Lens) ×1 IMPLANT
LENS IOL RAYONE EMV 21.0 (Intraocular Lens) IMPLANT
NDL HYPO 18GX1.5 BLUNT FILL (NEEDLE) ×1 IMPLANT
NEEDLE HYPO 18GX1.5 BLUNT FILL (NEEDLE) ×1 IMPLANT
PAD ARMBOARD 7.5X6 YLW CONV (MISCELLANEOUS) ×1 IMPLANT
RING MALYGIN 7.0 (MISCELLANEOUS) IMPLANT
SYR TB 1ML LL NO SAFETY (SYRINGE) ×1 IMPLANT
TAPE SURG TRANSPORE 1 IN (GAUZE/BANDAGES/DRESSINGS) IMPLANT
WATER STERILE IRR 250ML POUR (IV SOLUTION) ×1 IMPLANT

## 2023-01-30 NOTE — Discharge Instructions (Signed)
Please discharge patient when stable, will follow up today with Dr. Tanasha Menees at the [facility] office immediately following discharge.  Leave shield in place until visit.  All paperwork with discharge instructions will be given at the office.  [facility] Address:  [address]  , [place] [address]  Dr. Tyrion Glaude's Phone: [phone]  

## 2023-01-30 NOTE — Interval H&P Note (Signed)
History and Physical Interval Note:  01/30/2023 7:23 AM  The H and P was reviewed and updated. The patient was examined.  No changes were found after exam.  The surgical eye was marked.  [name]   

## 2023-01-30 NOTE — Anesthesia Preprocedure Evaluation (Signed)
Anesthesia Evaluation  Patient identified by MRN, date of birth, ID band Patient awake    Reviewed: Allergy & Precautions, H&P , NPO status , Patient's Chart, lab work & pertinent test results, reviewed documented beta blocker date and time   Airway Mallampati: II  TM Distance: >3 FB Neck ROM: Full    Dental  (+) Dental Advisory Given, Missing   Pulmonary shortness of breath and with exertion, COPD,  COPD inhaler, former smoker   Pulmonary exam normal breath sounds clear to auscultation       Cardiovascular METS3 - Mets hypertension, Pt. on medications and Pt. on home beta blockers + CAD, + Past MI, + Cardiac Stents, + Peripheral Vascular Disease and + DOE  Normal cardiovascular exam Rhythm:Regular Rate:Normal     Neuro/Psych negative neurological ROS  negative psych ROS   GI/Hepatic Neg liver ROS,GERD  Medicated and Controlled,,  Endo/Other  Hypothyroidism    Renal/GU negative Renal ROS  negative genitourinary   Musculoskeletal negative musculoskeletal ROS (+)    Abdominal   Peds negative pediatric ROS (+)  Hematology negative hematology ROS (+)   Anesthesia Other Findings   Reproductive/Obstetrics negative OB ROS                             Anesthesia Physical Anesthesia Plan  ASA: 3  Anesthesia Plan: MAC   Post-op Pain Management: Minimal or no pain anticipated   Induction: Intravenous  PONV Risk Score and Plan: Treatment may vary due to age or medical condition  Airway Management Planned:   Additional Equipment:   Intra-op Plan:   Post-operative Plan:   Informed Consent: I have reviewed the patients History and Physical, chart, labs and discussed the procedure including the risks, benefits and alternatives for the proposed anesthesia with the patient or authorized representative who has indicated his/her understanding and acceptance.     Dental advisory given  Plan  Discussed with: CRNA and Surgeon  Anesthesia Plan Comments:        Anesthesia Quick Evaluation  

## 2023-01-30 NOTE — Op Note (Addendum)
Date of procedure: 01/30/23  Pre-operative diagnosis: Visually significant age-related nuclear cataract, Right Eye (H25.11)  Post-operative diagnosis: Visually significant age-related nuclear cataract, Right Eye  Procedure: Removal of cataract via phacoemulsification and insertion of intra-ocular lens Rayner [id number] +21.0D into the capsular bag of the Right Eye  Attending surgeon: Pecolia Ades, MD  Anesthesia: MAC, Topical Akten  Complications: None  Estimated Blood Loss: <7mL (minimal)  Specimens: None  Implants:  Implant Name Type Inv. Item Serial No. Manufacturer Lot No. LRB No. Used Action  LENS IOL RAYNER 21.0 - [id number] Intraocular Lens LENS IOL RAYNER 21.0  SIGHTPATH [id number] Right 1 Implanted    Indications:  Visually significant age-related cataract, Right Eye  Procedure:  The patient was seen and identified in the pre-operative area. The operative eye was identified and dilated.  The operative eye was marked.  Topical anesthesia was administered to the operative eye.     The patient was then to the operative suite and placed in the supine position.  A timeout was performed confirming the patient, procedure to be performed, and all other relevant information.   The patient's face was prepped and draped in the usual fashion for intra-ocular surgery.  A lid speculum was placed into the operative eye and the surgical microscope moved into place and focused.  A superotemporal paracentesis was created using a 20 gauge paracentesis blade.  BSS mixed with Omidria, followed by 1% lidocaine was injected into the anterior chamber.  Viscoelastic was injected into the anterior chamber.  A temporal clear-corneal main wound incision was created using a 2.39mm microkeratome.  A continuous curvilinear capsulorrhexis was initiated using an irrigating cystitome and completed using capsulorrhexis forceps.  Hydrodissection and hydrodeliniation were performed.  Viscoelastic was injected into  the anterior chamber.  A phacoemulsification handpiece and a chopper as a second instrument were used to remove the nucleus and epinucleus. The irrigation/aspiration handpiece was used to remove any remaining cortical material.   The capsular bag was reinflated with viscoelastic, checked, and found to be intact.  The intraocular lens was inserted into the capsular bag.  The irrigation/aspiration handpiece was used to remove any remaining viscoelastic.  The clear corneal wound and paracentesis wounds were then hydrated and checked with Weck-Cels to be watertight.  The lid-speculum and drape was removed, and the patient's face was cleaned with a wet and dry 4x4.  Maxitrol drops were instilled onto the eye. A clear shield was taped over the eye. The patient was taken to the post-operative care unit in good condition, having tolerated the procedure well.  Post-Op Instructions: The patient will follow up at [facility] for a same day post-operative evaluation and will receive all other orders and instructions.

## 2023-01-30 NOTE — Transfer of Care (Addendum)
Immediate Anesthesia Transfer of Care Note  Patient: [name]  Procedure(s) Performed: CATARACT EXTRACTION PHACO AND INTRAOCULAR LENS PLACEMENT (IOC) (Right: Eye)  Patient Location: PACU and Short Stay  Anesthesia Type:MAC  Level of Consciousness: awake, alert , and patient cooperative  Airway & Oxygen Therapy: Patient Spontanous Breathing and Patient connected to nasal cannula oxygen  Post-op Assessment: Report given to RN and Post -op Vital signs reviewed and stable  Post vital signs: Reviewed and stable  Last Vitals:  Vitals Value Taken Time  BP 102/70 0753  Temp 36.6 0753  Pulse 61 0753  Resp 18 0753  SpO2 96% 0753    Last Pain:  Vitals:   01/30/23 0632  TempSrc: Oral      Patients Stated Pain Goal: 9 (01/30/23 1610)  Complications: No notable events documented.

## 2023-01-31 NOTE — Anesthesia Postprocedure Evaluation (Signed)
Anesthesia Post Note  Patient: [name]  Procedure(s) Performed: CATARACT EXTRACTION PHACO AND INTRAOCULAR LENS PLACEMENT (IOC) (Right: Eye)  Patient location during evaluation: Phase II Anesthesia Type: MAC Level of consciousness: awake Pain management: pain level controlled Vital Signs Assessment: post-procedure vital signs reviewed and stable Respiratory status: spontaneous breathing and respiratory function stable Cardiovascular status: blood pressure returned to baseline and stable Postop Assessment: no headache and no apparent nausea or vomiting Anesthetic complications: no Comments: Late entry   No notable events documented.   Last Vitals:  Vitals:   01/30/23 0632 01/30/23 0753  BP: 114/73 102/70  Pulse:  61  Resp:  18  Temp: (!) 36.4 C 36.6 C  SpO2: 96% 96%    Last Pain:  Vitals:   01/30/23 0753  TempSrc:   PainSc: 0-No pain                 Windell Norfolk

## 2023-02-02 NOTE — Addendum Note (Signed)
Addended by: Margaretha Sheffield on: 02/02/2023 04:42 PM   Modules accepted: Orders

## 2023-02-05 ENCOUNTER — Encounter (HOSPITAL_COMMUNITY): Payer: Self-pay | Admitting: Optometry

## 2023-02-11 ENCOUNTER — Other Ambulatory Visit: Payer: Self-pay | Admitting: Family Medicine

## 2023-02-12 ENCOUNTER — Encounter: Payer: Self-pay | Admitting: Nurse Practitioner

## 2023-02-12 ENCOUNTER — Ambulatory Visit (INDEPENDENT_AMBULATORY_CARE_PROVIDER_SITE_OTHER): Payer: Medicare Other | Admitting: Nurse Practitioner

## 2023-02-12 VITALS — BP 126/84 | Temp 97.6°F | Ht 61.0 in | Wt 147.6 lb

## 2023-02-12 DIAGNOSIS — B9689 Other specified bacterial agents as the cause of diseases classified elsewhere: Secondary | ICD-10-CM

## 2023-02-12 DIAGNOSIS — J441 Chronic obstructive pulmonary disease with (acute) exacerbation: Secondary | ICD-10-CM

## 2023-02-12 MED ORDER — PREDNISONE 20 MG PO TABS: ORAL_TABLET | ORAL | 0 refills | Status: DC

## 2023-02-12 MED ORDER — DOXYCYCLINE HYCLATE 100 MG PO TABS: 100.0000 mg | ORAL_TABLET | Freq: Two times a day (BID) | ORAL | 0 refills | Status: DC

## 2023-02-12 NOTE — Progress Notes (Signed)
   Subjective:    Patient ID: [name], female    DOB: 1960-11-14, 62 y.o.   MRN: [id number]  HPI  Patient returns with ongoing cough and congestion. Patient states it got better but returned worse after finishing med. Patient was started on a course of Augmentin on 01/21/2023.  Symptoms were much better but came back after short period of time.  Now producing green mucus.  Has a history of COPD.  Has noticed more frequent exacerbations lately.  Uses her Trelegy Ellipta as directed.  No fever.  Occasional sore throat.  No sinus headache.  Slight ear pain right side.  No chest pain or tightness.  Mild shortness of breath mainly with activity.  See previous note from Dr. Lorin Picket.  O2 at home running 92-93 unless she is active and then runs in the mid 80s.  Quit smoking back in 2007.  Has had occasional mild diarrhea but denies any severe symptoms after taking Augmentin.       Objective:   Physical Exam NAD.  Alert, oriented.  Calm cheerful affect.  TMs retracted, no erythema.  Pharynx moderately injected with PND noted.  Neck supple with mild soft anterior adenopathy.  Lungs mildly distant breath sounds but clear.  No tachypnea.  Normal color.  Heart regular rate rhythm.  Occasional harsh congested cough noted. Today's Vitals   02/12/23 0916  BP: 126/84  Temp: 97.6 F (36.4 C)  TempSrc: Oral  Weight: 147 lb 9.6 oz (67 kg)  Height: 5\' 1"  (1.549 m)   Body mass index is 27.89 kg/m.        Assessment & Plan:   Problem List Items Addressed This Visit       Respiratory   COPD exacerbation (HCC) - Primary   Relevant Medications   predniSONE (DELTASONE) 20 MG tablet   Other Relevant Orders   Ambulatory referral to Pulmonology   Other Visit Diagnoses     Bacterial URI          Meds ordered this encounter  Medications   doxycycline (VIBRA-TABS) 100 MG tablet    Sig: Take 1 tablet (100 mg total) by mouth 2 (two) times daily.    Dispense:  14 tablet    Refill:  0    Order  Specific Question:   Supervising Provider    Answer:   Lilyan Punt A [[id number]]   predniSONE (DELTASONE) 20 MG tablet    Sig: 3 po qd x 3 d then 2 po qd x 3 d then 1 po qd x 2 d    Dispense:  17 tablet    Refill:  0    Order Specific Question:   Supervising Provider    Answer:   Lilyan Punt A [[id number]]   Start doxycycline as directed.  Also sent in prescription for oral steroids to have on hand in case her symptoms become worse over the weekend.  Because of more frequent exacerbations of her COPD, will refe to local pulmonology clinic for evaluation.  Patient to continue her medications at this time including her Trelegy.  Warning signs reviewed.  Call back next week if no improvement, seek help sooner if worse.

## 2023-02-18 ENCOUNTER — Ambulatory Visit: Payer: Medicare Other | Admitting: Pulmonary Disease

## 2023-02-18 ENCOUNTER — Encounter: Payer: Self-pay | Admitting: Pulmonary Disease

## 2023-02-18 VITALS — BP 126/73 | HR 66 | Ht 61.0 in | Wt 149.6 lb

## 2023-02-18 DIAGNOSIS — J441 Chronic obstructive pulmonary disease with (acute) exacerbation: Secondary | ICD-10-CM | POA: Diagnosis not present

## 2023-02-18 DIAGNOSIS — R0609 Other forms of dyspnea: Secondary | ICD-10-CM

## 2023-02-18 DIAGNOSIS — J9611 Chronic respiratory failure with hypoxia: Secondary | ICD-10-CM | POA: Diagnosis not present

## 2023-02-18 DIAGNOSIS — Z87891 Personal history of nicotine dependence: Secondary | ICD-10-CM

## 2023-02-18 NOTE — Patient Instructions (Signed)
Lung function is at 40%  X Amb sat  X Low dose CT chest in July  X PFTs @ [facility]

## 2023-02-18 NOTE — Progress Notes (Signed)
Subjective:    Patient ID: [name], female    DOB: 14-Feb-1961, 62 y.o.   MRN: [id number]  HPI 62 year old heavy ex-smoker presents to establish care for COPD due to frequent flares. She smoked about 3 packs/day until she quit in 2006, more than 60 pack years. Spirometry in 2015 showed FEV1 of 39%.  Her oxygen saturation has been documented low at multiple occasions.  She had COVID infection in February 2023 in 2024 but did not require hospitalization. She required 2 antibiotic courses in May, initially seen 5/1 and given Augmentin and prednisone.  She was seen again on 5/24 at this time given doxycycline.  She feels this was related to sinus drip and pollen. She is compliant with Trelegy and this worked better than [employer].  She uses albuterol MDI for rescue and rarely needs albuterol nebs. We reviewed prior low-dose CT chest. She is able to walk in the store and for short distances without support  PMH : CAD s/p DES to the RCA in 2009   Significant tests/ events reviewed  01/2023 O2 saturation was 94% at rest and dropped to 90% on walking 3 laps LDCT chest 01/2022 >>chronic RML scarring, mild emphysema, unchanged nodules since 2019  , largest LUL 7mm  Spirometry 04/2014 FEV1 0.95/39%, ratio 56  Past Medical History:  Diagnosis Date   COPD (chronic obstructive pulmonary disease) (HCC)    Coronary atherosclerosis of native coronary artery    DES RCA 5/09, nonobstructive left system   Essential hypertension    Hypothyroidism    Mixed hyperlipidemia    NSTEMI (non-ST elevated myocardial infarction) (HCC)    2009    Past Surgical History:  Procedure Laterality Date   BREAST LUMPECTOMY Right    CATARACT EXTRACTION W/PHACO Left 11/14/2022   Procedure: CATARACT EXTRACTION PHACO AND INTRAOCULAR LENS PLACEMENT (IOC);  Surgeon: Pecolia Ades, MD;  Location: [facility];  Service: Ophthalmology;  Laterality: Left;  CDE: 6.04   CATARACT EXTRACTION W/PHACO Right 01/30/2023    Procedure: CATARACT EXTRACTION PHACO AND INTRAOCULAR LENS PLACEMENT (IOC);  Surgeon: Pecolia Ades, MD;  Location: [facility];  Service: Ophthalmology;  Laterality: Right;  CDE: 3.86   COLONOSCOPY N/A 05/26/2018   Procedure: COLONOSCOPY;  Surgeon: Malissa Hippo, MD;  Location: [facility];  Service: Endoscopy;  Laterality: N/A;  1030   COLONOSCOPY WITH PROPOFOL N/A 10/16/2021   Procedure: COLONOSCOPY WITH PROPOFOL;  Surgeon: Dolores Frame, MD;  Location: [facility];  Service: Gastroenterology;  Laterality: N/A;  10:05   POLYPECTOMY  05/26/2018   Procedure: POLYPECTOMY;  Surgeon: Malissa Hippo, MD;  Location: [facility];  Service: Endoscopy;;  colon   POLYPECTOMY  10/16/2021   Procedure: POLYPECTOMY;  Surgeon: Dolores Frame, MD;  Location: [facility];  Service: Gastroenterology;;   TOTAL ABDOMINAL HYSTERECTOMY     TUMOR REMOVAL Right    over right eye and has a plastic plate there.    No Known Allergies  Social History   Socioeconomic History   Marital status: Married    Spouse name: Not on file   Number of children: Not on file   Years of education: Not on file   Highest education level: Not on file  Occupational History   Not on file  Tobacco Use   Smoking status: Former    Packs/day: 3.00    Years: 33.00    Additional pack years: 0.00    Total pack years: 99.00    Types: Cigarettes  Start date: 10/27/1972    Quit date: 2007    Years since quitting: 17.4   Smokeless tobacco: Never  Vaping Use   Vaping Use: Never used  Substance and Sexual Activity   Alcohol use: No    Alcohol/week: 0.0 standard drinks of alcohol   Drug use: No   Sexual activity: Not Currently  Other Topics Concern   Not on file  Social History Narrative   Full time. Married. Regularly exercises    Social Determinants of Health   Financial Resource Strain: Low Risk  (01/23/2023)   Overall Financial Resource Strain (CARDIA)    Difficulty of Paying Living  Expenses: Not very hard  Food Insecurity: No Food Insecurity (01/23/2023)   Hunger Vital Sign    Worried About Running Out of Food in the Last Year: Never true    Ran Out of Food in the Last Year: Never true  Transportation Needs: No Transportation Needs (01/23/2023)   PRAPARE - Administrator, Civil Service (Medical): No    Lack of Transportation (Non-Medical): No  Physical Activity: Sufficiently Active (01/23/2023)   Exercise Vital Sign    Days of Exercise per Week: 5 days    Minutes of Exercise per Session: 30 min  Stress: No Stress Concern Present (01/23/2023)   [employer] of Occupational Health - Occupational Stress Questionnaire    Feeling of Stress : Not at all  Social Connections: Socially Integrated (01/23/2023)   Social Connection and Isolation Panel [NHANES]    Frequency of Communication with Friends and Family: More than three times a week    Frequency of Social Gatherings with Friends and Family: More than three times a week    Attends Religious Services: More than 4 times per year    Active Member of [employer] or Organizations: Yes    Attends Engineer, structural: More than 4 times per year    Marital Status: Married  Catering manager Violence: Not At Risk (01/23/2023)   Humiliation, Afraid, Rape, and Kick questionnaire    Fear of Current or Ex-Partner: No    Emotionally Abused: No    Physically Abused: No    Sexually Abused: No    Family History  Problem Relation Age of Onset   Pneumonia Brother        age 89   Cancer Brother        larynx - age 34   Pneumonia Brother        declining health - age 47     Review of Systems Constitutional: negative for anorexia, fevers and sweats  Eyes: negative for irritation, redness and visual disturbance  Ears, nose, mouth, throat, and face: negative for earaches, epistaxis, nasal congestion and sore throat  Respiratory: negative for cough,  sputum and wheezing  Cardiovascular: negative for chest  pain, lower extremity edema, orthopnea, palpitations and syncope  Gastrointestinal: negative for abdominal pain, constipation, diarrhea, melena, nausea and vomiting  Genitourinary:negative for dysuria, frequency and hematuria  Hematologic/lymphatic: negative for bleeding, easy bruising and lymphadenopathy  Musculoskeletal:negative for arthralgias, muscle weakness and stiff joints  Neurological: negative for coordination problems, gait problems, headaches and weakness  Endocrine: negative for diabetic symptoms including polydipsia, polyuria and weight loss     Objective:   Physical Exam   Gen. Pleasant, well-nourished,elderly, in no distress, normal affect ENT - no pallor,icterus, no post nasal drip Neck: No JVD, no thyromegaly, no carotid bruits Lungs: no use of accessory muscles, no dullness to percussion, clear without rales or  rhonchi  Cardiovascular: Rhythm regular, heart sounds  normal, no murmurs or gallops, no peripheral edema Abdomen: soft and non-tender, no hepatosplenomegaly, BS normal. Musculoskeletal: No deformities, no cyanosis or clubbing Neuro:  alert, non focal        Assessment & Plan:

## 2023-02-18 NOTE — Assessment & Plan Note (Signed)
She is currently being treated for an exacerbation with a second round of antibiotics/doxycycline.  She has already completed Augmentin and a course of prednisone earlier in the month. She will continue on Trelegy.  She has albuterol MDI and/or nebs to use for rescue.  She does not seem to have active bronchospasms and does not need more steroids. She sounds clear today so we will hold off on chest x-ray We discussed pulmonary rehab program

## 2023-02-18 NOTE — Assessment & Plan Note (Signed)
She desaturates but recovers with resting and does not seem to require oxygen at this time but her oxygenation is borderline and I would not be surprised if she needs oxygen during an exacerbation.  I advised her to enroll in pulmonary rehab and she will consider. She is due for her annual screening CT scan, previously noted nodules are unchanged and considered benign

## 2023-02-23 ENCOUNTER — Telehealth: Payer: Self-pay | Admitting: Pulmonary Disease

## 2023-02-23 NOTE — Telephone Encounter (Signed)
Patient would like Geophysical data processor. Patient phone number is [phone].

## 2023-02-25 NOTE — Telephone Encounter (Signed)
ATC pt LVM for her to give [place] a call back for more info.

## 2023-03-04 NOTE — Telephone Encounter (Signed)
ATC pt x 2 LVM for her in regards to wanting a portable neb machine.

## 2023-03-04 NOTE — Telephone Encounter (Signed)
Patient returning call to discuss portable nebulizer machine. Please call and advise patient.  [phone]

## 2023-03-05 ENCOUNTER — Ambulatory Visit (INDEPENDENT_AMBULATORY_CARE_PROVIDER_SITE_OTHER): Payer: Medicare Other | Admitting: Family Medicine

## 2023-03-05 VITALS — BP 131/81 | HR 61 | Ht 61.0 in | Wt 151.4 lb

## 2023-03-05 DIAGNOSIS — E038 Other specified hypothyroidism: Secondary | ICD-10-CM | POA: Diagnosis not present

## 2023-03-05 DIAGNOSIS — Z79899 Other long term (current) drug therapy: Secondary | ICD-10-CM

## 2023-03-05 DIAGNOSIS — I10 Essential (primary) hypertension: Secondary | ICD-10-CM | POA: Diagnosis not present

## 2023-03-05 DIAGNOSIS — E782 Mixed hyperlipidemia: Secondary | ICD-10-CM | POA: Diagnosis not present

## 2023-03-05 DIAGNOSIS — I2510 Atherosclerotic heart disease of native coronary artery without angina pectoris: Secondary | ICD-10-CM | POA: Diagnosis not present

## 2023-03-05 MED ORDER — LEVOTHYROXINE SODIUM 125 MCG PO TABS: ORAL_TABLET | ORAL | 1 refills | Status: DC

## 2023-03-05 NOTE — Telephone Encounter (Signed)
[facility] asking if she can purchase a portable nebulizer online - I let her know that if she would prefer to do that it was up to her but I am willing to place a DME order. She states that she will let me know later on if she needs that. NFN att.

## 2023-03-05 NOTE — Progress Notes (Signed)
Subjective:    Patient ID: [name], female    DOB: November 20, 1960, 62 y.o.   MRN: [id number]  HPI Patient arrives today for 6 week follow up. Patient states no concerns or issues today. Recently saw a pulmonologist Essential hypertension, benign  Atherosclerosis of native coronary artery of native heart without angina pectoris  Mixed hyperlipidemia - Plan: Lipid panel  Other specified hypothyroidism - Plan: TSH + free T4  High risk medication use - Plan: Basic Metabolic Panel, Hepatic function panel  Outpatient Encounter Medications as of 03/05/2023  Medication Sig   albuterol (PROVENTIL) (2.5 MG/3ML) 0.083% nebulizer solution USE 1 VIAL IN NEBULIZER EVERY 4 HOURS AS NEEDED FOR SHORTNESS OF BREATH   albuterol (VENTOLIN HFA) 108 (90 Base) MCG/ACT inhaler Inhale 2 puffs into the lungs every 4 (four) hours as needed for wheezing or shortness of breath.   aspirin 81 MG EC tablet Take 81 mg by mouth at bedtime.   Cholecalciferol (VITAMIN D3) 400 units CAPS Take 400 Units by mouth at bedtime.    Fluticasone-Umeclidin-Vilant (TRELEGY ELLIPTA) 100-62.5-25 MCG/ACT AEPB Inhale 1 puff into the lungs daily.   metoprolol succinate (TOPROL-XL) 25 MG 24 hr tablet 1/2 qd   Multiple Vitamin (MULTIVITAMIN) tablet Take 1 tablet by mouth daily.   nitroGLYCERIN (NITROSTAT) 0.4 MG SL tablet Place 1 tablet (0.4 mg total) under the tongue every 5 (five) minutes x 3 doses as needed for chest pain (if no relief after 3rd dose, proceed to ED or call 911).   pantoprazole (PROTONIX) 40 MG tablet Take 1 tablet (40 mg total) by mouth daily.   rosuvastatin (CRESTOR) 40 MG tablet Take 1 tablet (40 mg total) by mouth daily.   vitamin C (ASCORBIC ACID) 500 MG tablet Take 500 mg by mouth daily.   [DISCONTINUED] levothyroxine (SYNTHROID) 125 MCG tablet 1 qam all days except Mondays  use 1/2 in the am.   levothyroxine (SYNTHROID) 125 MCG tablet 1 qam all days except Mondays  use 1/2 in the am.   predniSONE  (DELTASONE) 20 MG tablet 3 po qd x 3 d then 2 po qd x 3 d then 1 po qd x 2 d (Patient taking differently: 3 po qd x 3 d then 2 po qd x 3 d then 1 po qd x 2 d  Pt has not started keeping on hand for exacerbations)   [DISCONTINUED] doxycycline (VIBRA-TABS) 100 MG tablet Take 1 tablet (100 mg total) by mouth 2 (two) times daily. (Patient taking differently: Take 100 mg by mouth 2 (two) times daily. 2 days remaining 02/18/2023)   Facility-Administered Encounter Medications as of 03/05/2023  Medication   S[facility] dose#1 phenylephrine 1%/ketorolac 0.3% (OMIDRIA) in BSS 500 ML (MUST DILUTE PRIOR TO USE) [employer]ime   History of heart disease no angina currently History of hypothyroidism takes medicine regular basis energy level doing okay History of COPD does not smoke using her Trelegy has had a couple flareups and recent visit with pulmonary please see notes   Review of Systems     Objective:   Physical Exam General-in no acute distress Eyes-no discharge Lungs-respiratory rate normal, CTA CV-no murmurs,RRR Extremities skin warm dry no edema Neuro grossly normal Behavior normal, alert        Assessment & Plan:  1. Essential hypertension, benign Blood pressure good control continue current measures  2. Atherosclerosis of native coronary artery of native heart without angina pectoris Continue cholesterol medicine patient would like to work hard on cholesterol and she will consider  adding additional medicine if LDL is not below 70 on the next visit she will do her lab work before her next visit in the fall  3. Mixed hyperlipidemia See above continue cholesterol medicine - Lipid panel  4. Other specified hypothyroidism Continue thyroid medicine check lab work before next visit - TSH + free T4  5. High risk medication use Lab work before next visit - Basic Metabolic Panel - Hepatic function panel  Follow-up sooner if any problems

## 2023-04-15 ENCOUNTER — Ambulatory Visit (HOSPITAL_COMMUNITY)
Admission: RE | Admit: 2023-04-15 | Discharge: 2023-04-15 | Disposition: A | Discharge status: Home / Self Care | Payer: Medicare Other | Source: Ambulatory Visit | Attending: Pulmonary Disease | Admitting: Pulmonary Disease

## 2023-04-15 DIAGNOSIS — Z87891 Personal history of nicotine dependence: Secondary | ICD-10-CM | POA: Insufficient documentation

## 2023-05-12 ENCOUNTER — Ambulatory Visit (HOSPITAL_COMMUNITY)
Admission: RE | Admit: 2023-05-12 | Discharge: 2023-05-12 | Disposition: A | Discharge status: Home / Self Care | Payer: Medicare Other | Source: Ambulatory Visit | Attending: Pulmonary Disease | Admitting: Pulmonary Disease

## 2023-05-12 DIAGNOSIS — Z87891 Personal history of nicotine dependence: Secondary | ICD-10-CM | POA: Insufficient documentation

## 2023-05-12 DIAGNOSIS — R0609 Other forms of dyspnea: Secondary | ICD-10-CM | POA: Diagnosis not present

## 2023-05-12 LAB — PULMONARY FUNCTION TEST
DL/VA % pred: 48 %
DL/VA: 2.09 ml/min/mmHg/L
DLCO unc % pred: 42 %
DLCO unc: 7.51 ml/min/mmHg
FEF 25-75 Post: 0.24 L/s
FEF 25-75 Pre: 0.26 L/s
FEF2575-%Change-Post: -6 %
FEF2575-%Pred-Post: 12 %
FEF2575-%Pred-Pre: 12 %
FEV1-%Change-Post: 6 %
FEV1-%Pred-Post: 37 %
FEV1-%Pred-Pre: 34 %
FEV1-Post: 0.81 L
FEV1-Pre: 0.76 L
FEV1FVC-%Change-Post: 8 %
FEV1FVC-%Pred-Pre: 49 %
FEV6-%Change-Post: -1 %
FEV6-%Pred-Post: 60 %
FEV6-%Pred-Pre: 61 %
FEV6-Post: 1.65 L
FEV6-Pre: 1.67 L
FEV6FVC-%Change-Post: 0 %
FEV6FVC-%Pred-Post: 87 %
FEV6FVC-%Pred-Pre: 87 %
FVC-%Change-Post: -1 %
FVC-%Pred-Post: 69 %
FVC-%Pred-Pre: 69 %
FVC-Post: 1.96 L
FVC-Pre: 1.98 L
Post FEV1/FVC ratio: 41 %
Post FEV6/FVC ratio: 84 %
Pre FEV1/FVC ratio: 38 %
Pre FEV6/FVC Ratio: 84 %
RV % pred: 201 %
RV: 3.87 L
TLC % pred: 131 %
TLC: 6.06 L

## 2023-05-12 MED ORDER — ALBUTEROL SULFATE (2.5 MG/3ML) 0.083% IN NEBU
2.5000 mg | INHALATION_SOLUTION | Freq: Once | RESPIRATORY_TRACT | Status: AC
  Administered 2023-05-12: 2.5 mg via RESPIRATORY_TRACT

## 2023-05-14 DIAGNOSIS — K080 Exfoliation of teeth due to systemic causes: Secondary | ICD-10-CM | POA: Diagnosis not present

## 2023-05-27 ENCOUNTER — Encounter: Payer: Self-pay | Admitting: Pulmonary Disease

## 2023-05-27 ENCOUNTER — Ambulatory Visit: Payer: Medicare Other | Admitting: Pulmonary Disease

## 2023-05-27 ENCOUNTER — Other Ambulatory Visit: Payer: Self-pay

## 2023-05-27 VITALS — BP 129/79 | HR 72 | Ht 61.0 in | Wt 144.0 lb

## 2023-05-27 DIAGNOSIS — J4489 Other specified chronic obstructive pulmonary disease: Secondary | ICD-10-CM

## 2023-05-27 DIAGNOSIS — J9611 Chronic respiratory failure with hypoxia: Secondary | ICD-10-CM | POA: Diagnosis not present

## 2023-05-27 NOTE — Patient Instructions (Addendum)
X refer to Pulm rehab  Flu & RSV shot recommended

## 2023-05-27 NOTE — Progress Notes (Signed)
   Subjective:    Patient ID: [name], female    DOB: [birth year], 62 y.o.   MRN: [id number]  HPI  62 year old heavy ex-smoker for FU of  COPD due to frequent flares. She smoked about 3 packs/day until she quit in 2006, more than 60 pack years  PMH : CAD s/p DES to the RCA in 2009   13-month follow-up visit She had an exacerbation 12/2022 that required 2 rounds of antibiotics and prednisone. She has lost about 10 pounds since then.  She is able to walk more she is breathing better.  Her last use of albuterol was 2 months ago. She is compliant with Trelegy We reviewed low-dose CT chest and PFTs today   Significant tests/ events reviewed   01/2023 O2 saturation was 94% at rest and dropped to 90% on walking 3 laps  LDCT chest 04/2023 RADS 2, stable 7 mm nodules LDCT chest 01/2022 >>chronic RML scarring, mild emphysema, unchanged nodules since 2019  , largest LUL 7mm   PFTs 04/2023 ratio 38, FEV1 0.76/34%, FVC 69%, DLCO 42%Spirometry 04/2014 FEV1 0.95/39%, ratio 56  Review of Systems neg for any significant sore throat, dysphagia, itching, sneezing, nasal congestion or excess/ purulent secretions, fever, chills, sweats, unintended wt loss, pleuritic or exertional cp, hempoptysis, orthopnea pnd or change in chronic leg swelling. Also denies presyncope, palpitations, heartburn, abdominal pain, nausea, vomiting, diarrhea or change in bowel or urinary habits, dysuria,hematuria, rash, arthralgias, visual complaints, headache, numbness weakness or ataxia.     Objective:   Physical Exam  Gen. Pleasant, well-nourished, in no distress ENT - no thrush, no pallor/icterus,no post nasal drip Neck: No JVD, no thyromegaly, no carotid bruits Lungs: no use of accessory muscles, no dullness to percussion, decreased BL  without rales or rhonchi  Cardiovascular: Rhythm regular, heart sounds  normal, no murmurs or gallops, no peripheral edema Musculoskeletal: No deformities, no cyanosis or clubbing         Assessment & Plan:

## 2023-05-27 NOTE — Assessment & Plan Note (Signed)
Oxygen saturation is low at 92%.  She would like to hold off oxygen unless absolutely necessary

## 2023-05-27 NOTE — Assessment & Plan Note (Addendum)
Severe COPD but symptomatically improved. She will continue on Trelegy. We discussed COPD action plan and signs and symptoms of COPD exacerbation. We will refer her to pulmonary rehab she seems to be motivated Flu and RSV shot recommended

## 2023-05-29 ENCOUNTER — Encounter (HOSPITAL_COMMUNITY)
Admission: RE | Admit: 2023-05-29 | Discharge: 2023-05-29 | Disposition: A | Discharge status: Home / Self Care | Payer: Medicare Other | Source: Ambulatory Visit | Attending: Pulmonary Disease | Admitting: Pulmonary Disease

## 2023-05-29 DIAGNOSIS — J4489 Other specified chronic obstructive pulmonary disease: Secondary | ICD-10-CM | POA: Insufficient documentation

## 2023-06-04 ENCOUNTER — Encounter (HOSPITAL_COMMUNITY)
Admission: RE | Admit: 2023-06-04 | Discharge: 2023-06-04 | Disposition: A | Discharge status: Home / Self Care | Payer: Medicare Other | Source: Ambulatory Visit | Attending: Pulmonary Disease | Admitting: Pulmonary Disease

## 2023-06-04 ENCOUNTER — Encounter (HOSPITAL_COMMUNITY): Payer: Self-pay

## 2023-06-04 VITALS — BP 110/60 | HR 67 | Ht 61.5 in | Wt 142.6 lb

## 2023-06-04 DIAGNOSIS — J4489 Other specified chronic obstructive pulmonary disease: Secondary | ICD-10-CM

## 2023-06-04 NOTE — Patient Instructions (Signed)
Patient Instructions  Patient Details  Name: [name] MRN: [id number] Date of Birth: 14-Apr-1961 Referring Provider:  Oretha Milch, MD  Below are your personal goals for exercise, nutrition, and risk factors. Our goal is to help you stay on track towards obtaining and maintaining these goals. We will be discussing your progress on these goals with you throughout the program.  Initial Exercise Prescription:  Initial Exercise Prescription - 06/04/23 1000       Date of Initial Exercise RX and Referring Provider   Date 06/04/23    Referring Provider Dr. Vassie Loll      Oxygen   Maintain Oxygen Saturation 88% or higher      Treadmill   MPH 2    Grade 0    Minutes 15      Recumbant Elliptical   Level 1    RPM 60    Minutes 15      Prescription Details   Frequency (times per week) 2    Duration Progress to 30 minutes of continuous aerobic without signs/symptoms of physical distress      Intensity   THRR 40-80% of Max Heartrate 103-138    Ratings of Perceived Exertion 11-13    Perceived Dyspnea 0-4      Resistance Training   Training Prescription Yes    Weight 3    Reps 10-15             Exercise Goals: Frequency: Be able to perform aerobic exercise two to three times per week in program working toward 2-5 days per week of home exercise.  Intensity: Work with a perceived exertion of 11 (fairly light) - 15 (hard) while following your exercise prescription.  We will make changes to your prescription with you as you progress through the program.   Duration: Be able to do 30 to 45 minutes of continuous aerobic exercise in addition to a 5 minute warm-up and a 5 minute cool-down routine.   Nutrition Goals: Your personal nutrition goals will be established when you do your nutrition analysis with the dietician.  The following are general nutrition guidelines to follow: Cholesterol < 200mg /day Sodium < 1500mg /day Fiber: Women over 50 yrs - 21 grams per day  Personal  Goals:  Personal Goals and Risk Factors at Admission - 05/29/23 0935       Core Components/Risk Factors/Patient Goals on Admission    Weight Management Weight Maintenance    Improve shortness of breath with ADL's Yes    Intervention Provide education, individualized exercise plan and daily activity instruction to help decrease symptoms of SOB with activities of daily living.    Expected Outcomes Short Term: Improve cardiorespiratory fitness to achieve a reduction of symptoms when performing ADLs;Long Term: Be able to perform more ADLs without symptoms or delay the onset of symptoms    Increase knowledge of respiratory medications and ability to use respiratory devices properly  Yes    Intervention Provide education and demonstration as needed of appropriate use of medications, inhalers, and oxygen therapy.    Expected Outcomes Short Term: Achieves understanding of medications use. Understands that oxygen is a medication prescribed by physician. Demonstrates appropriate use of inhaler and oxygen therapy.;Long Term: Maintain appropriate use of medications, inhalers, and oxygen therapy.    Hypertension Yes    Intervention Provide education on lifestyle modifcations including regular physical activity/exercise, weight management, moderate sodium restriction and increased consumption of fresh fruit, vegetables, and low fat dairy, alcohol moderation, and smoking cessation.;Monitor prescription use compliance.  Expected Outcomes Short Term: Continued assessment and intervention until BP is < 140/31mm HG in hypertensive participants. < 130/79mm HG in hypertensive participants with diabetes, heart failure or chronic kidney disease.;Long Term: Maintenance of blood pressure at goal levels.    Lipids Yes    Intervention Provide education and support for participant on nutrition & aerobic/resistive exercise along with prescribed medications to achieve LDL 70mg , HDL >40mg .    Expected Outcomes Short Term:  Participant states understanding of desired cholesterol values and is compliant with medications prescribed. Participant is following exercise prescription and nutrition guidelines.;Long Term: Cholesterol controlled with medications as prescribed, with individualized exercise RX and with personalized nutrition plan. Value goals: LDL < 70mg , HDL > 40 mg.             Tobacco Use Initial Evaluation: Social History   Tobacco Use  Smoking Status Former   Current packs/day: 0.00   Average packs/day: 3.0 packs/day for 33.0 years (99.0 ttl pk-yrs)   Types: Cigarettes   Start date: 10/27/1972   Quit date: 2007   Years since quitting: 17.7  Smokeless Tobacco Never    Exercise Goals and Review:  Exercise Goals     Row Name 06/04/23 1005             Exercise Goals   Increase Physical Activity Yes       Intervention Provide advice, education, support and counseling about physical activity/exercise needs.;Develop an individualized exercise prescription for aerobic and resistive training based on initial evaluation findings, risk stratification, comorbidities and participant's personal goals.       Expected Outcomes Short Term: Attend rehab on a regular basis to increase amount of physical activity.;Long Term: Add in home exercise to make exercise part of routine and to increase amount of physical activity.;Long Term: Exercising regularly at least 3-5 days a week.       Increase Strength and Stamina Yes       Intervention Provide advice, education, support and counseling about physical activity/exercise needs.;Develop an individualized exercise prescription for aerobic and resistive training based on initial evaluation findings, risk stratification, comorbidities and participant's personal goals.       Expected Outcomes Short Term: Increase workloads from initial exercise prescription for resistance, speed, and METs.;Short Term: Perform resistance training exercises routinely during rehab and add  in resistance training at home;Long Term: Improve cardiorespiratory fitness, muscular endurance and strength as measured by increased METs and functional capacity ( )       Able to understand and use rate of perceived exertion (RPE) scale Yes       Intervention Provide education and explanation on how to use RPE scale       Expected Outcomes Short Term: Able to use RPE daily in rehab to express subjective intensity level;Long Term:  Able to use RPE to guide intensity level when exercising independently       Able to understand and use Dyspnea scale Yes       Intervention Provide education and explanation on how to use Dyspnea scale       Expected Outcomes Short Term: Able to use Dyspnea scale daily in rehab to express subjective sense of shortness of breath during exertion;Long Term: Able to use Dyspnea scale to guide intensity level when exercising independently       Knowledge and understanding of Target Heart Rate Range (THRR) Yes       Intervention Provide education and explanation of THRR including how the numbers were predicted and where they are located  for reference       Expected Outcomes Short Term: Able to use daily as guideline for intensity in rehab;Short Term: Able to state/look up THRR;Long Term: Able to use THRR to govern intensity when exercising independently       Able to check pulse independently Yes       Intervention Provide education and demonstration on how to check pulse in carotid and radial arteries.;Review the importance of being able to check your own pulse for safety during independent exercise       Expected Outcomes Short Term: Able to explain why pulse checking is important during independent exercise;Long Term: Able to check pulse independently and accurately       Understanding of Exercise Prescription Yes       Intervention Provide education, explanation, and written materials on patient's individual exercise prescription       Expected Outcomes Short Term: Able  to explain program exercise prescription;Long Term: Able to explain home exercise prescription to exercise independently                Copy of goals given to participant.

## 2023-06-04 NOTE — Progress Notes (Signed)
Pulmonary Individual Treatment Plan  Patient Details  Name: [name] MRN: [id number] Date of Birth: Jan 26, 1961 Referring Provider:   Flowsheet Row PULMONARY REHAB COPD ORIENTATION from 06/04/2023 in [facility]IAC RE[facility]ITATION  Referring Provider Dr. Vassie Loll       Initial Encounter Date:  Flowsheet Row PULMONARY REHAB COPD ORIENTATION from 06/04/2023 in [facility] CARDIAC REHABILITATION  Date 06/04/23       Visit Diagnosis: COPD with chronic bronchitis  Patient's Home Medications on Admission:   Current Outpatient Medications:    albuterol (PROVENTIL) (2.5 MG/3ML) 0.083% nebulizer solution, USE 1 VIAL IN NEBULIZER EVERY 4 HOURS AS NEEDED FOR SHORTNESS OF BREATH, Disp: 360 mL, Rfl: 0   albuterol (VENTOLIN HFA) 108 (90 Base) MCG/ACT inhaler, Inhale 2 puffs into the lungs every 4 (four) hours as needed for wheezing or shortness of breath., Disp: 8 g, Rfl: 5   aspirin 81 MG EC tablet, Take 81 mg by mouth at bedtime., Disp: , Rfl:    Cholecalciferol (VITAMIN D3) 400 units CAPS, Take 400 Units by mouth at bedtime. , Disp: , Rfl:    diphenhydrAMINE (BENADRYL) 25 mg capsule, Take 25 mg by mouth at bedtime., Disp: , Rfl:    Fluticasone-Umeclidin-Vilant (TRELEGY ELLIPTA) 100-62.5-25 MCG/ACT AEPB, Inhale 1 puff into the lungs daily., Disp: 1 each, Rfl: 11   levothyroxine (SYNTHROID) 125 MCG tablet, 1 qam all days except Mondays  use 1/2 in the am., Disp: 90 tablet, Rfl: 1   metoprolol succinate (TOPROL-XL) 25 MG 24 hr tablet, 1/2 qd, Disp: 90 tablet, Rfl: 3   Multiple Vitamin (MULTIVITAMIN) tablet, Take 1 tablet by mouth daily., Disp: , Rfl:    nitroGLYCERIN (NITROSTAT) 0.4 MG SL tablet, Place 1 tablet (0.4 mg total) under the tongue every 5 (five) minutes x 3 doses as needed for chest pain (if no relief after 3rd dose, proceed to ED or call 911)., Disp: 25 tablet, Rfl: 3   pantoprazole (PROTONIX) 40 MG tablet, Take 1 tablet (40 mg total) by mouth daily., Disp: 90 tablet, Rfl: 1    rosuvastatin (CRESTOR) 40 MG tablet, Take 1 tablet (40 mg total) by mouth daily., Disp: 90 tablet, Rfl: 1   vitamin C (ASCORBIC ACID) 500 MG tablet, Take 500 mg by mouth daily., Disp: , Rfl:    vitamin E 180 MG (400 UNITS) capsule, Take 400 Units by mouth daily., Disp: , Rfl:  No current facility-administered medications for this encounter.  Facility-Administered Medications Ordered in Other Encounters:    [facility] dose#1 phenylephrine 1%/ketorolac 0.3% (OMIDRIA) in BSS 500 ML (MUST DILUTE PRIOR TO USE) [employer], , , PRN, Pecolia Ades, MD, 500 mL at 11/14/22 1054  Past Medical History: Past Medical History:  Diagnosis Date   COPD (chronic obstructive pulmonary disease) (HCC)    Coronary atherosclerosis of native coronary artery    DES RCA 5/09, nonobstructive left system   Essential hypertension    Hypothyroidism    Mixed hyperlipidemia    NSTEMI (non-ST elevated myocardial infarction) (HCC)    2009    Tobacco Use: Social History   Tobacco Use  Smoking Status Former   Current packs/day: 0.00   Average packs/day: 3.0 packs/day for 33.0 years (99.0 ttl pk-yrs)   Types: Cigarettes   Start date: 10/27/1972   Quit date: 2007   Years since quitting: 17.7  Smokeless Tobacco Never    Labs: Review Flowsheet  More data exists      Latest Ref Rng & Units 01/17/2021 07/29/2021 01/01/2022 07/03/2022 01/19/2023  Labs  for ITP Cardiac and Pulmonary Rehab  Cholestrol 100 - 199 mg/dL 161  096  045  409  811   LDL (calc) 0 - 99 mg/dL 61  94  70  90  83   HDL-C >39 mg/dL 55  49  57  52  53   Trlycerides 0 - 149 mg/dL 914  782  956  213  086     Details            Capillary Blood Glucose: Lab Results  Component Value Date   GLUCAP 97 06/21/2018     Pulmonary Assessment Scores:  UCSD: Self-administered rating of dyspnea associated with activities of daily living (ADLs) 6-point scale (0 = "not at all" to 5 = "maximal or unable to do because of breathlessness")  Scoring Scores  range from 0 to 120.  Minimally important difference is 5 units  CAT: CAT can identify the health impairment of COPD patients and is better correlated with disease progression.  CAT has a scoring range of zero to 40. The CAT score is classified into four groups of low (less than 10), medium (10 - 20), high (21-30) and very high (31-40) based on the impact level of disease on health status. A CAT score over 10 suggests significant symptoms.  A worsening CAT score could be explained by an exacerbation, poor medication adherence, poor inhaler technique, or progression of COPD or comorbid conditions.  CAT MCID is 2 points  mMRC: mMRC (Modified Medical Research Council) Dyspnea Scale is used to assess the degree of baseline functional disability in patients of respiratory disease due to dyspnea. No minimal important difference is established. A decrease in score of 1 point or greater is considered a positive change.   Pulmonary Function Assessment:   Exercise Target Goals: Exercise Program Goal: Individual exercise prescription set using results from initial 6 min walk test and THRR while considering  patient's activity barriers and safety.   Exercise Prescription Goal: Initial exercise prescription builds to 30-45 minutes a day of aerobic activity, 2-3 days per week.  Home exercise guidelines will be given to patient during program as part of exercise prescription that the participant will acknowledge.  Activity Barriers & Risk Stratification:  Activity Barriers & Cardiac Risk Stratification - 05/29/23 0907       Activity Barriers & Cardiac Risk Stratification   Activity Barriers Shortness of Breath;Deconditioning    Cardiac Risk Stratification Moderate             6 Minute Walk:  6 Minute Walk     Row Name 06/04/23 1003         6 Minute Walk   Phase Initial     Distance 1060 feet     Walk Time 6 minutes     # of Rest Breaks 0     MPH 2.01     METS 2.66     RPE 11      Perceived Dyspnea  1     VO2 Peak 9.31     Symptoms No     Resting HR 67 bpm     Resting BP 110/60     Resting Oxygen Saturation  94 %     Exercise Oxygen Saturation  during 6 min walk 85 %     Max Ex. HR 90 bpm     Max Ex. BP 132/60     2 Minute Post BP 120/60       Interval HR   1 Minute  HR 106     2 Minute HR 93     3 Minute HR 88     4 Minute HR 90     5 Minute HR 88     6 Minute HR 89     2 Minute Post HR 75     Interval Heart Rate? Yes       Interval Oxygen   Interval Oxygen? Yes     Baseline Oxygen Saturation % 94 %     1 Minute Oxygen Saturation % 88 %     1 Minute Liters of Oxygen 0 L     2 Minute Oxygen Saturation % 86 %     2 Minute Liters of Oxygen 0 L     3 Minute Oxygen Saturation % 86 %     3 Minute Liters of Oxygen 0 L     4 Minute Oxygen Saturation % 85 %     4 Minute Liters of Oxygen 0 L     5 Minute Oxygen Saturation % 86 %     5 Minute Liters of Oxygen 0 L     6 Minute Oxygen Saturation % 87 %     6 Minute Liters of Oxygen 0 L     2 Minute Post Oxygen Saturation % 94 %     2 Minute Post Liters of Oxygen 0 L              Oxygen Initial Assessment:   Oxygen Re-Evaluation:   Oxygen Discharge (Final Oxygen Re-Evaluation):   Initial Exercise Prescription:  Initial Exercise Prescription - 06/04/23 1000       Date of Initial Exercise RX and Referring Provider   Date 06/04/23    Referring Provider Dr. Vassie Loll      Oxygen   Maintain Oxygen Saturation 88% or higher      Treadmill   MPH 2    Grade 0    Minutes 15      Recumbant Elliptical   Level 1    RPM 60    Minutes 15      Prescription Details   Frequency (times per week) 2    Duration Progress to 30 minutes of continuous aerobic without signs/symptoms of physical distress      Intensity   THRR 40-80% of Max Heartrate 103-138    Ratings of Perceived Exertion 11-13    Perceived Dyspnea 0-4      Resistance Training   Training Prescription Yes    Weight 3    Reps 10-15              Perform Capillary Blood Glucose checks as needed.  Exercise Prescription Changes:   Exercise Comments:   Exercise Goals and Review:   Exercise Goals     Row Name 06/04/23 1005             Exercise Goals   Increase Physical Activity Yes       Intervention Provide advice, education, support and counseling about physical activity/exercise needs.;Develop an individualized exercise prescription for aerobic and resistive training based on initial evaluation findings, risk stratification, comorbidities and participant's personal goals.       Expected Outcomes Short Term: Attend rehab on a regular basis to increase amount of physical activity.;Long Term: Add in home exercise to make exercise part of routine and to increase amount of physical activity.;Long Term: Exercising regularly at least 3-5 days a week.       Increase Strength and Stamina  Yes       Intervention Provide advice, education, support and counseling about physical activity/exercise needs.;Develop an individualized exercise prescription for aerobic and resistive training based on initial evaluation findings, risk stratification, comorbidities and participant's personal goals.       Expected Outcomes Short Term: Increase workloads from initial exercise prescription for resistance, speed, and METs.;Short Term: Perform resistance training exercises routinely during rehab and add in resistance training at home;Long Term: Improve cardiorespiratory fitness, muscular endurance and strength as measured by increased METs and functional capacity ( )       Able to understand and use rate of perceived exertion (RPE) scale Yes       Intervention Provide education and explanation on how to use RPE scale       Expected Outcomes Short Term: Able to use RPE daily in rehab to express subjective intensity level;Long Term:  Able to use RPE to guide intensity level when exercising independently       Able to understand and use  Dyspnea scale Yes       Intervention Provide education and explanation on how to use Dyspnea scale       Expected Outcomes Short Term: Able to use Dyspnea scale daily in rehab to express subjective sense of shortness of breath during exertion;Long Term: Able to use Dyspnea scale to guide intensity level when exercising independently       Knowledge and understanding of Target Heart Rate Range (THRR) Yes       Intervention Provide education and explanation of THRR including how the numbers were predicted and where they are located for reference       Expected Outcomes Short Term: Able to use daily as guideline for intensity in rehab;Short Term: Able to state/look up THRR;Long Term: Able to use THRR to govern intensity when exercising independently       Able to check pulse independently Yes       Intervention Provide education and demonstration on how to check pulse in carotid and radial arteries.;Review the importance of being able to check your own pulse for safety during independent exercise       Expected Outcomes Short Term: Able to explain why pulse checking is important during independent exercise;Long Term: Able to check pulse independently and accurately       Understanding of Exercise Prescription Yes       Intervention Provide education, explanation, and written materials on patient's individual exercise prescription       Expected Outcomes Short Term: Able to explain program exercise prescription;Long Term: Able to explain home exercise prescription to exercise independently                Exercise Goals Re-Evaluation :   Discharge Exercise Prescription (Final Exercise Prescription Changes):   Nutrition:  Target Goals: Understanding of nutrition guidelines, daily intake of sodium 1500mg , cholesterol 200mg , calories 30% from fat and 7% or less from saturated fats, daily to have 5 or more servings of fruits and vegetables.  Biometrics:  Pre Biometrics - 06/04/23 1006        Pre Biometrics   Height 5' 1.5" (1.562 m)    Weight 64.7 kg    Waist Circumference 33.5 inches    Hip Circumference 38 inches    Waist to Hip Ratio 0.88 %    BMI (Calculated) 26.52    Grip Strength 25.3 kg              Nutrition Therapy Plan and Nutrition Goals:  Nutrition Assessments:  MEDIFICTS Score Key: >=70 Need to make dietary changes  40-70 Heart Healthy Diet <= 40 Therapeutic Level Cholesterol Diet   Picture Your Plate Scores: <78 Unhealthy dietary pattern with much room for improvement. 41-50 Dietary pattern unlikely to meet recommendations for good health and room for improvement. 51-60 More healthful dietary pattern, with some room for improvement.  >60 Healthy dietary pattern, although there may be some specific behaviors that could be improved.    Nutrition Goals Re-Evaluation:   Nutrition Goals Discharge (Final Nutrition Goals Re-Evaluation):   Psychosocial: Target Goals: Acknowledge presence or absence of significant depression and/or stress, maximize coping skills, provide positive support system. Participant is able to verbalize types and ability to use techniques and skills needed for reducing stress and depression.  Initial Review & Psychosocial Screening:  Initial Psych Review & Screening - 05/29/23 0936       Initial Review   Current issues with Current Sleep Concerns      Family Dynamics   Good Support System? Yes      Barriers   Psychosocial barriers to participate in program There are no identifiable barriers or psychosocial needs.      Screening Interventions   Interventions Encouraged to exercise;To provide support and resources with identified psychosocial needs;Provide feedback about the scores to participant    Expected Outcomes Short Term goal: Utilizing psychosocial counselor, staff and physician to assist with identification of specific Stressors or current issues interfering with healing process. Setting desired goal for each  stressor or current issue identified.;Long Term Goal: Stressors or current issues are controlled or eliminated.;Short Term goal: Identification and review with participant of any Quality of Life or Depression concerns found by scoring the questionnaire.;Long Term goal: The participant improves quality of Life and PHQ9 Scores as seen by post scores and/or verbalization of changes             Quality of Life Scores:  Scores of 19 and below usually indicate a poorer quality of life in these areas.  A difference of  2-3 points is a clinically meaningful difference.  A difference of 2-3 points in the total score of the Quality of Life Index has been associated with significant improvement in overall quality of life, self-image, physical symptoms, and general health in studies assessing change in quality of life.   PHQ-9: Review Flowsheet  More data exists      06/04/2023 03/05/2023 02/12/2023 01/23/2023 01/21/2023  Depression screen PHQ 2/9  Decreased Interest 0 0 0 0 0  Down, Depressed, Hopeless 0 0 0 0 0  PHQ - 2 Score 0 0 0 0 0  Altered sleeping 1 0 - - -  Tired, decreased energy 0 1 - - -  Change in appetite 0 0 - - -  Feeling bad or failure about yourself  0 0 - - -  Trouble concentrating 0 0 - - -  Moving slowly or fidgety/restless 0 0 - - -  Suicidal thoughts 0 0 - - -  PHQ-9 Score 1 1 - - -  Difficult doing work/chores Not difficult at all Somewhat difficult - - -    Details           Interpretation of Total Score  Total Score Depression Severity:  1-4 = Minimal depression, 5-9 = Mild depression, 10-14 = Moderate depression, 15-19 = Moderately severe depression, 20-27 = Severe depression   Psychosocial Evaluation and Intervention:  Psychosocial Evaluation - 05/29/23 2956  Psychosocial Evaluation & Interventions   Interventions Stress management education;Relaxation education;Encouraged to exercise with the program and follow exercise prescription    Comments Patient  referred to [facility] with COPD with chronic bronchitis. She lives with her husband of 42 years and they have a son and 2 grandchildren. Her husband and grandchildren are her main support people. She tries to live a very active life but has not been able to do as much as she would like recently due to her advancing COPD. She does walk everyday 30 mins. She denies any depression, anxiety or stress. She does have trouble staying asleep some nights. She recently started taking benadryl and was taking the generic brand from [facility]. She did not realize she was taking benadryl and ask if this was safe.  I advised her to speak with her pcp about her concerns about taking benadryl. She has tried melotonin in the past and it did not help. She said she had been reluctant to participate in the program for a long time eventhough Dr. Vassie Loll had recommended it but she saw a video on you-tube about pulmonary rehabilitation and its benefits and decided to give it a try. She does have a $15 dollar copayment and she was not sure if this was going to be a barrier but said she wanted to do as much as she could of the program. No other barriers identified. Her main goals for the program are to get her lungs stronger and be able to do all the activities she wants to do. She wants to be able to help her husband mow the yard. She said she was not able to go out onto the beach with her grandchildren this summer for the first time since they were born, 14 years, due to her SOB and this really brothered her. She would like to get her lungs strong enough to participate in these activities.    Expected Outcomes Short Term: Start and attend the program consistently. Long Term: Get her lung stronger and be able to do all the activities she wants to do.    Continue Psychosocial Services  Follow up required by staff             Psychosocial Re-Evaluation:   Psychosocial Discharge (Final Psychosocial Re-Evaluation):    Education: Education  Goals: Education classes will be provided on a weekly basis, covering required topics. Participant will state understanding/return demonstration of topics presented.  Learning Barriers/Preferences:  Learning Barriers/Preferences - 05/29/23 0935       Learning Barriers/Preferences   Learning Barriers None    Learning Preferences Audio;Written Material             Education Topics: How Lungs Work and Diseases: - Discuss the anatomy of the lungs and diseases that can affect the lungs, such as COPD.   Exercise: -Discuss the importance of exercise, FITT principles of exercise, normal and abnormal responses to exercise, and how to exercise safely.   Environmental Irritants: -Discuss types of environmental irritants and how to limit exposure to environmental irritants.   Meds/Inhalers and oxygen: - Discuss respiratory medications, definition of an inhaler and oxygen, and the proper way to use an inhaler and oxygen.   Energy Saving Techniques: - Discuss methods to conserve energy and decrease shortness of breath when performing activities of daily living.    Bronchial Hygiene / Breathing Techniques: - Discuss breathing mechanics, pursed-lip breathing technique,  proper posture, effective ways to clear airways, and other functional breathing techniques   Cleaning  Equipment: - Provides group verbal and written instruction about the health risks of elevated stress, cause of high stress, and healthy ways to reduce stress.   Nutrition I: Fats: - Discuss the types of cholesterol, what cholesterol does to the body, and how cholesterol levels can be controlled.   Nutrition II: Labels: -Discuss the different components of food labels and how to read food labels.   Respiratory Infections: - Discuss the signs and symptoms of respiratory infections, ways to prevent respiratory infections, and the importance of seeking medical treatment when having a respiratory  infection.   Stress I: Signs and Symptoms: - Discuss the causes of stress, how stress may lead to anxiety and depression, and ways to limit stress.   Stress II: Relaxation: -Discuss relaxation techniques to limit stress.   Oxygen for Home/Travel: - Discuss how to prepare for travel when on oxygen and proper ways to transport and store oxygen to ensure safety.   Knowledge Questionnaire Score:   Core Components/Risk Factors/Patient Goals at Admission:  Personal Goals and Risk Factors at Admission - 05/29/23 0935       Core Components/Risk Factors/Patient Goals on Admission    Weight Management Weight Maintenance    Improve shortness of breath with ADL's Yes    Intervention Provide education, individualized exercise plan and daily activity instruction to help decrease symptoms of SOB with activities of daily living.    Expected Outcomes Short Term: Improve cardiorespiratory fitness to achieve a reduction of symptoms when performing ADLs;Long Term: Be able to perform more ADLs without symptoms or delay the onset of symptoms    Increase knowledge of respiratory medications and ability to use respiratory devices properly  Yes    Intervention Provide education and demonstration as needed of appropriate use of medications, inhalers, and oxygen therapy.    Expected Outcomes Short Term: Achieves understanding of medications use. Understands that oxygen is a medication prescribed by physician. Demonstrates appropriate use of inhaler and oxygen therapy.;Long Term: Maintain appropriate use of medications, inhalers, and oxygen therapy.    Hypertension Yes    Intervention Provide education on lifestyle modifcations including regular physical activity/exercise, weight management, moderate sodium restriction and increased consumption of fresh fruit, vegetables, and low fat dairy, alcohol moderation, and smoking cessation.;Monitor prescription use compliance.    Expected Outcomes Short Term: Continued  assessment and intervention until BP is < 140/3mm HG in hypertensive participants. < 130/46mm HG in hypertensive participants with diabetes, heart failure or chronic kidney disease.;Long Term: Maintenance of blood pressure at goal levels.    Lipids Yes    Intervention Provide education and support for participant on nutrition & aerobic/resistive exercise along with prescribed medications to achieve LDL 70mg , HDL >40mg .    Expected Outcomes Short Term: Participant states understanding of desired cholesterol values and is compliant with medications prescribed. Participant is following exercise prescription and nutrition guidelines.;Long Term: Cholesterol controlled with medications as prescribed, with individualized exercise RX and with personalized nutrition plan. Value goals: LDL < 70mg , HDL > 40 mg.             Core Components/Risk Factors/Patient Goals Review:    Core Components/Risk Factors/Patient Goals at Discharge (Final Review):    ITP Comments:   Comments: Patient arrived for 1st visit/orientation/education at 0830. Patient was referred to [facility] by Cyril Mourning due to COPD with chronic bronchitis. During orientation advised patient on arrival and appointment times what to wear, what to do before, during and after exercise. Reviewed attendance and class policy.  Pt is  scheduled to return Pulmonary Rehab on 06/08/23 at 0800. Pt was advised to come to class 15 minutes before class starts.  Discussed RPE/Dpysnea scales. Patient participated in warm up stretches. Patient was able to complete 6 minute walk test.  Patient was measured for the equipment. Discussed equipment safety with patient. Took patient pre-anthropometric measurements. Patient finished visit at [phone].

## 2023-06-08 ENCOUNTER — Encounter (HOSPITAL_COMMUNITY)
Admission: RE | Admit: 2023-06-08 | Discharge: 2023-06-08 | Disposition: A | Discharge status: Home / Self Care | Payer: Medicare Other | Source: Ambulatory Visit | Attending: Pulmonary Disease | Admitting: Pulmonary Disease

## 2023-06-08 DIAGNOSIS — J4489 Other specified chronic obstructive pulmonary disease: Secondary | ICD-10-CM

## 2023-06-08 NOTE — Progress Notes (Signed)
Daily Session Note  Patient Details  Name: [name] MRN: [id number] Date of Birth: 02-11-1961 Referring Provider:   Flowsheet Row PULMONARY REHAB COPD ORIENTATION from 06/04/2023 in [facility] CARDIAC REHABILITATION  Referring Provider Dr. Vassie Loll       Encounter Date: 06/08/2023  Check In:  Session Check In - 06/08/23 0745       Check-In   Supervising physician immediately available to respond to emergencies See telemetry face sheet for immediately available MD    Location [facility]    Staff Present Ross Ludwig, BS, Exercise Physiologist;Jessica Juanetta Gosling, MA, RCEP, CCRP, CCET    Virtual Visit No    Medication changes reported     No    Fall or balance concerns reported    No    Tobacco Cessation No Change    Warm-up and Cool-down Performed on first and last piece of equipment    Resistance Training Performed Yes    VAD Patient? No    PAD/SET Patient? No      Pain Assessment   Currently in Pain? No/denies    Multiple Pain Sites No             Capillary Blood Glucose: No results found for this or any previous visit (from the past 24 hour(s)).    Social History   Tobacco Use  Smoking Status Former   Current packs/day: 0.00   Average packs/day: 3.0 packs/day for 33.0 years (99.0 ttl pk-yrs)   Types: Cigarettes   Start date: 10/27/1972   Quit date: 2007   Years since quitting: 17.7  Smokeless Tobacco Never    Goals Met:  Independence with exercise equipment Exercise tolerated well No report of concerns or symptoms today Strength training completed today  Goals Unmet:  Not Applicable  Comments: First full day of exercise!  Patient was oriented to gym and equipment including functions, settings, policies, and procedures.  Patient's individual exercise prescription and treatment plan were reviewed.  All starting workloads were established based on the results of the 6 minute walk test done at initial orientation visit.  The plan for  exercise progression was also introduced and progression will be customized based on patient's performance and goals.    Dr. Erick Blinks is Medical Director for [facility] Pulmonary Rehab.

## 2023-06-10 ENCOUNTER — Encounter (HOSPITAL_COMMUNITY)
Admission: RE | Admit: 2023-06-10 | Discharge: 2023-06-10 | Disposition: A | Discharge status: Home / Self Care | Payer: Medicare Other | Source: Ambulatory Visit | Attending: Pulmonary Disease

## 2023-06-10 DIAGNOSIS — J4489 Other specified chronic obstructive pulmonary disease: Secondary | ICD-10-CM | POA: Diagnosis not present

## 2023-06-10 NOTE — Progress Notes (Signed)
Daily Session Note  Patient Details  Name: [name] MRN: [id number] Date of Birth: Oct 01, 1960 Referring Provider:   Flowsheet Row PULMONARY REHAB COPD ORIENTATION from 06/04/2023 in [facility] CARDIAC REHABILITATION  Referring Provider Dr. Vassie Loll       Encounter Date: 06/10/2023  Check In:  Session Check In - 06/10/23 0759       Check-In   Supervising physician immediately available to respond to emergencies See telemetry face sheet for immediately available ER MD    Location [facility]    Staff Present Fabio Pierce, MA, RCEP, CCRP, CCET;Heather Fredric Mare, BS, Exercise Physiologist;Debra Laural Benes, RN, BSN    Virtual Visit No    Medication changes reported     No    Fall or balance concerns reported    No    Warm-up and Cool-down Performed on first and last piece of equipment    Resistance Training Performed Yes    VAD Patient? No    PAD/SET Patient? No      Pain Assessment   Currently in Pain? No/denies             Capillary Blood Glucose: No results found for this or any previous visit (from the past 24 hour(s)).    Social History   Tobacco Use  Smoking Status Former   Current packs/day: 0.00   Average packs/day: 3.0 packs/day for 33.0 years (99.0 ttl pk-yrs)   Types: Cigarettes   Start date: 10/27/1972   Quit date: 2007   Years since quitting: 17.7  Smokeless Tobacco Never    Goals Met:  Proper associated with RPD/PD & O2 Sat Using PLB without cueing & demonstrates good technique Exercise tolerated well No report of concerns or symptoms today Strength training completed today  Goals Unmet:  Not Applicable  Comments: Pt able to follow exercise prescription today without complaint.  Will continue to monitor for progression.    Dr. Erick Blinks is Medical Director for [name] Pulmonary Rehab.

## 2023-06-15 ENCOUNTER — Encounter (HOSPITAL_COMMUNITY)
Admission: RE | Admit: 2023-06-15 | Discharge: 2023-06-15 | Disposition: A | Discharge status: Home / Self Care | Payer: Medicare Other | Source: Ambulatory Visit | Attending: Pulmonary Disease

## 2023-06-15 DIAGNOSIS — J4489 Other specified chronic obstructive pulmonary disease: Secondary | ICD-10-CM | POA: Diagnosis not present

## 2023-06-15 NOTE — Progress Notes (Signed)
Daily Session Note  Patient Details  Name: [name] MRN: [id number] Date of Birth: 25-Apr-1961 Referring Provider:   Flowsheet Row PULMONARY REHAB COPD ORIENTATION from 06/04/2023 in [facility] CARDIAC REHABILITATION  Referring Provider Dr. Vassie Loll       Encounter Date: 06/15/2023  Check In:  Session Check In - 06/15/23 0745       Check-In   Supervising physician immediately available to respond to emergencies See telemetry face sheet for immediately available MD    Location [facility]    Staff Present Ross Ludwig, BS, Exercise Physiologist;Debra Laural Benes, RN, BSN;Hisayo Delossantos, RN;Daphyne [place], RN, BSN;Jessica Hawkins, MA, RCEP, CCRP, CCET    Virtual Visit No    Medication changes reported     No    Fall or balance concerns reported    No    Tobacco Cessation No Change    Warm-up and Cool-down Performed on first and last piece of equipment    Resistance Training Performed Yes    VAD Patient? No    PAD/SET Patient? No      Pain Assessment   Currently in Pain? No/denies    Multiple Pain Sites No             Capillary Blood Glucose: No results found for this or any previous visit (from the past 24 hour(s)).    Social History   Tobacco Use  Smoking Status Former   Current packs/day: 0.00   Average packs/day: 3.0 packs/day for 33.0 years (99.0 ttl pk-yrs)   Types: Cigarettes   Start date: 10/27/1972   Quit date: 2007   Years since quitting: 17.7  Smokeless Tobacco Never    Goals Met:  Independence with exercise equipment Exercise tolerated well No report of concerns or symptoms today Strength training completed today  Goals Unmet:  Not Applicable  Comments: Pt able to follow exercise prescription today without complaint.  Will continue to monitor for progression.    Dr. Dina Rich is Medical Director for [facility] Cardiac Rehab

## 2023-06-17 ENCOUNTER — Encounter (HOSPITAL_COMMUNITY)
Admission: RE | Admit: 2023-06-17 | Discharge: 2023-06-17 | Disposition: A | Discharge status: Home / Self Care | Payer: Medicare Other | Source: Ambulatory Visit | Attending: Pulmonary Disease | Admitting: Pulmonary Disease

## 2023-06-17 DIAGNOSIS — J4489 Other specified chronic obstructive pulmonary disease: Secondary | ICD-10-CM | POA: Diagnosis not present

## 2023-06-17 NOTE — Progress Notes (Signed)
I have reviewed a Home Exercise Prescription with Royston Cowper . Klara is  currently exercising at home by walking 30 minutes .  The patient was advised to exercise 5 days a week for 30-45 minutes.  Taquia and I discussed how to progress their exercise prescription.  The patient stated that their goals were to increase endurance and maintain health.  The patient stated that they understand the exercise prescription.  We reviewed exercise guidelines, target heart rate during exercise, RPE Scale, weather conditions, NTG use, endpoints for exercise, warmup and cool down.  Patient is encouraged to come to me with any questions. I will continue to follow up with the patient to assist them with progression and safety.

## 2023-06-17 NOTE — Progress Notes (Signed)
Daily Session Note  Patient Details  Name: [name] MRN: [id number] Date of Birth: [birth year] Referring Provider:   Flowsheet Row PULMONARY REHAB COPD ORIENTATION from 06/04/2023 in [facility] CARDIAC REHABILITATION  Referring Provider Dr. Vassie Loll       Encounter Date: 06/17/2023  Check In:  Session Check In - 06/17/23 0813       Check-In   Supervising physician immediately available to respond to emergencies See telemetry face sheet for immediately available MD    Location [facility]    Staff Present Ross Ludwig, BS, Exercise Physiologist;Hovanes Hymas Juanetta Gosling, MA, RCEP, CCRP, CCET;Hillary Troutman BSN, RN    Virtual Visit No    Medication changes reported     No    Fall or balance concerns reported    No    Warm-up and Cool-down Performed on first and last piece of equipment    Resistance Training Performed Yes    VAD Patient? No    PAD/SET Patient? No      Pain Assessment   Currently in Pain? No/denies             Capillary Blood Glucose: No results found for this or any previous visit (from the past 24 hour(s)).    Social History   Tobacco Use  Smoking Status Former   Current packs/day: 0.00   Average packs/day: 3.0 packs/day for 33.0 years (99.0 ttl pk-yrs)   Types: Cigarettes   Start date: 10/27/1972   Quit date: 2007   Years since quitting: 17.7  Smokeless Tobacco Never    Goals Met:  Proper associated with RPD/PD & O2 Sat Exercise tolerated well No report of concerns or symptoms today Strength training completed today  Goals Unmet:  Not Applicable  Comments: Pt able to follow exercise prescription today without complaint.  Will continue to monitor for progression.    Dr. Erick Blinks is Medical Director for [facility] Pulmonary Rehab.

## 2023-06-22 ENCOUNTER — Encounter (HOSPITAL_COMMUNITY)
Admission: RE | Admit: 2023-06-22 | Discharge: 2023-06-22 | Disposition: A | Discharge status: Home / Self Care | Payer: Medicare Other | Source: Ambulatory Visit | Attending: Pulmonary Disease | Admitting: Pulmonary Disease

## 2023-06-22 DIAGNOSIS — J4489 Other specified chronic obstructive pulmonary disease: Secondary | ICD-10-CM

## 2023-06-22 NOTE — Progress Notes (Signed)
Daily Session Note  Patient Details  Name: [name] MRN: [id number] Date of Birth: 08/15/1961 Referring Provider:   Flowsheet Row PULMONARY REHAB COPD ORIENTATION from 06/04/2023 in [facility] CARDIAC REHABILITATION  Referring Provider Dr. Vassie Loll       Encounter Date: 06/22/2023  Check In:  Session Check In - 06/22/23 0745       Check-In   Supervising physician immediately available to respond to emergencies See telemetry face sheet for immediately available ER MD    Location [facility]    Staff Present Rodena Medin, RN, BSN;Jessica Juanetta Gosling, MA, RCEP, CCRP, CCET;Heather Fredric Mare, [place], Exercise Physiologist    Virtual Visit No    Medication changes reported     No    Fall or balance concerns reported    No    Warm-up and Cool-down Performed on first and last piece of equipment    Resistance Training Performed Yes    VAD Patient? No    PAD/SET Patient? No      Pain Assessment   Currently in Pain? No/denies    Multiple Pain Sites No             Capillary Blood Glucose: No results found for this or any previous visit (from the past 24 hour(s)).    Social History   Tobacco Use  Smoking Status Former   Current packs/day: 0.00   Average packs/day: 3.0 packs/day for 33.0 years (99.0 ttl pk-yrs)   Types: Cigarettes   Start date: 10/27/1972   Quit date: 2007   Years since quitting: 17.7  Smokeless Tobacco Never    Goals Met:  Independence with exercise equipment Exercise tolerated well No report of concerns or symptoms today Strength training completed today  Goals Unmet:  Not Applicable  Comments: Pt able to follow exercise prescription today without complaint.  Will continue to monitor for progression.    Dr. Dina Rich is Medical Director for [facility] Cardiac Rehab

## 2023-06-24 ENCOUNTER — Encounter (HOSPITAL_COMMUNITY)
Admission: RE | Admit: 2023-06-24 | Discharge: 2023-06-24 | Disposition: A | Discharge status: Home / Self Care | Payer: Medicare Other | Source: Ambulatory Visit | Attending: Pulmonary Disease | Admitting: Pulmonary Disease

## 2023-06-24 DIAGNOSIS — J4489 Other specified chronic obstructive pulmonary disease: Secondary | ICD-10-CM | POA: Insufficient documentation

## 2023-06-24 NOTE — Progress Notes (Signed)
Daily Session Note  Patient Details  Name: [name] MRN: [id number] Date of Birth: [birth year] Referring Provider:   Flowsheet Row PULMONARY REHAB COPD ORIENTATION from 06/04/2023 in [facility] CARDIAC REHABILITATION  Referring Provider Dr. Vassie Loll       Encounter Date: 06/24/2023  Check In:  Session Check In - 06/24/23 0745       Check-In   Supervising physician immediately available to respond to emergencies See telemetry face sheet for immediately available MD    Location [facility]    Staff Present Ross Ludwig, BS, Exercise Physiologist;Hillary Roseto BSN, RN;Debra Laural Benes, RN, BSN    Virtual Visit No    Medication changes reported     No    Fall or balance concerns reported    No    Tobacco Cessation No Change    Warm-up and Cool-down Performed on first and last piece of equipment    Resistance Training Performed Yes    VAD Patient? No    PAD/SET Patient? No      Pain Assessment   Currently in Pain? No/denies    Multiple Pain Sites No             Capillary Blood Glucose: No results found for this or any previous visit (from the past 24 hour(s)).    Social History   Tobacco Use  Smoking Status Former   Current packs/day: 0.00   Average packs/day: 3.0 packs/day for 33.0 years (99.0 ttl pk-yrs)   Types: Cigarettes   Start date: 10/27/1972   Quit date: 2007   Years since quitting: 17.7  Smokeless Tobacco Never    Goals Met:  Independence with exercise equipment Exercise tolerated well No report of concerns or symptoms today Strength training completed today  Goals Unmet:  Not Applicable  Comments: Pt able to follow exercise prescription today without complaint.  Will continue to monitor for progression.    Dr. Erick Blinks is Medical Director for [facility] Pulmonary Rehab.

## 2023-06-25 ENCOUNTER — Ambulatory Visit: Payer: Medicare Other | Admitting: Pulmonary Disease

## 2023-06-29 ENCOUNTER — Encounter (HOSPITAL_COMMUNITY)
Admission: RE | Admit: 2023-06-29 | Discharge: 2023-06-29 | Disposition: A | Discharge status: Home / Self Care | Payer: Medicare Other | Source: Ambulatory Visit | Attending: Pulmonary Disease | Admitting: Pulmonary Disease

## 2023-06-29 DIAGNOSIS — J4489 Other specified chronic obstructive pulmonary disease: Secondary | ICD-10-CM | POA: Diagnosis not present

## 2023-06-29 NOTE — Progress Notes (Signed)
Daily Session Note  Patient Details  Name: KOLETTE VEY MRN: [id number] Date of Birth: [birth year] Referring Provider:   Flowsheet Row PULMONARY REHAB COPD ORIENTATION from 06/04/2023 in [facility] CARDIAC REHABILITATION  Referring Provider Dr. Vassie Loll       Encounter Date: 06/29/2023  Check In:  Session Check In - 06/29/23 0745       Check-In   Supervising physician immediately available to respond to emergencies See telemetry face sheet for immediately available MD    Location [facility]    Staff Present Fabio Pierce, MA, RCEP, CCRP, Harolyn Rutherford, RN, BSN    Virtual Visit No    Medication changes reported     No    Fall or balance concerns reported    No    Tobacco Cessation No Change    Warm-up and Cool-down Performed on first and last piece of equipment    Resistance Training Performed Yes    VAD Patient? No      Pain Assessment   Currently in Pain? No/denies             Capillary Blood Glucose: No results found for this or any previous visit (from the past 24 hour(s)).    Social History   Tobacco Use  Smoking Status Former   Current packs/day: 0.00   Average packs/day: 3.0 packs/day for 33.0 years (99.0 ttl pk-yrs)   Types: Cigarettes   Start date: 10/27/1972   Quit date: 2007   Years since quitting: 17.7  Smokeless Tobacco Never    Goals Met:  Independence with exercise equipment Exercise tolerated well No report of concerns or symptoms today Strength training completed today  Goals Unmet:  Not Applicable  Comments: Pt able to follow exercise prescription today without complaint.  Will continue to monitor for progression.    Dr. Dina Rich is Medical Director for [facility] Cardiac Rehab

## 2023-07-01 ENCOUNTER — Encounter (HOSPITAL_COMMUNITY): Payer: Self-pay | Admitting: *Deleted

## 2023-07-01 ENCOUNTER — Encounter (HOSPITAL_COMMUNITY)
Admission: RE | Admit: 2023-07-01 | Discharge: 2023-07-01 | Disposition: A | Discharge status: Home / Self Care | Payer: Medicare Other | Source: Ambulatory Visit | Attending: Pulmonary Disease | Admitting: Pulmonary Disease

## 2023-07-01 DIAGNOSIS — J4489 Other specified chronic obstructive pulmonary disease: Secondary | ICD-10-CM

## 2023-07-01 NOTE — Progress Notes (Signed)
Daily Session Note  Patient Details  Name: [name] MRN: [id number] Date of Birth: [birth year] Referring Provider:   Flowsheet Row PULMONARY REHAB COPD ORIENTATION from 06/04/2023 in [facility] CARDIAC REHABILITATION  Referring Provider Dr. Vassie Loll       Encounter Date: 07/01/2023  Check In:  Session Check In - 07/01/23 0745       Check-In   Supervising physician immediately available to respond to emergencies See telemetry face sheet for immediately available ER MD    Location [facility]    Staff Present Rodena Medin, RN, BSN;Jessica Juanetta Gosling, MA, RCEP, CCRP, CCET;Heather Fredric Mare, [place], Exercise Physiologist    Virtual Visit No    Medication changes reported     No    Fall or balance concerns reported    No    Warm-up and Cool-down Performed on first and last piece of equipment    Resistance Training Performed Yes    VAD Patient? No    PAD/SET Patient? No      Pain Assessment   Currently in Pain? No/denies    Multiple Pain Sites No             Capillary Blood Glucose: No results found for this or any previous visit (from the past 24 hour(s)).    Social History   Tobacco Use  Smoking Status Former   Current packs/day: 0.00   Average packs/day: 3.0 packs/day for 33.0 years (99.0 ttl pk-yrs)   Types: Cigarettes   Start date: 10/27/1972   Quit date: 2007   Years since quitting: 17.7  Smokeless Tobacco Never    Goals Met:  Proper associated with RPD/PD & O2 Sat Independence with exercise equipment Using PLB without cueing & demonstrates good technique Exercise tolerated well No report of concerns or symptoms today Strength training completed today  Goals Unmet:  Not Applicable  Comments: Pt able to follow exercise prescription today without complaint.  Will continue to monitor for progression.     Dr. Erick Blinks is Medical Director for [facility] Pulmonary Rehab.

## 2023-07-01 NOTE — Progress Notes (Signed)
Pulmonary Individual Treatment Plan  Patient Details  Name: [name] MRN: [id number] Date of Birth: 1960-12-08 Referring Provider:   Flowsheet Row PULMONARY REHAB COPD ORIENTATION from 06/04/2023 in [facility] CARDIAC REHABILITATION  Referring Provider Dr. Vassie Loll       Initial Encounter Date:  Flowsheet Row PULMONARY REHAB COPD ORIENTATION from 06/04/2023 in [facility] CARDIAC REHABILITATION  Date 06/04/23       Visit Diagnosis: COPD with chronic bronchitis (HCC)  Patient's Home Medications on Admission:   Current Outpatient Medications:    albuterol (PROVENTIL) (2.5 MG/3ML) 0.083% nebulizer solution, USE 1 VIAL IN NEBULIZER EVERY 4 HOURS AS NEEDED FOR SHORTNESS OF BREATH, Disp: 360 mL, Rfl: 0   albuterol (VENTOLIN HFA) 108 (90 Base) MCG/ACT inhaler, Inhale 2 puffs into the lungs every 4 (four) hours as needed for wheezing or shortness of breath., Disp: 8 g, Rfl: 5   aspirin 81 MG EC tablet, Take 81 mg by mouth at bedtime., Disp: , Rfl:    Cholecalciferol (VITAMIN D3) 400 units CAPS, Take 400 Units by mouth at bedtime. , Disp: , Rfl:    diphenhydrAMINE (BENADRYL) 25 mg capsule, Take 25 mg by mouth at bedtime., Disp: , Rfl:    Fluticasone-Umeclidin-Vilant (TRELEGY ELLIPTA) 100-62.5-25 MCG/ACT AEPB, Inhale 1 puff into the lungs daily., Disp: 1 each, Rfl: 11   levothyroxine (SYNTHROID) 125 MCG tablet, 1 qam all days except Mondays  use 1/2 in the am., Disp: 90 tablet, Rfl: 1   metoprolol succinate (TOPROL-XL) 25 MG 24 hr tablet, 1/2 qd, Disp: 90 tablet, Rfl: 3   Multiple Vitamin (MULTIVITAMIN) tablet, Take 1 tablet by mouth daily., Disp: , Rfl:    nitroGLYCERIN (NITROSTAT) 0.4 MG SL tablet, Place 1 tablet (0.4 mg total) under the tongue every 5 (five) minutes x 3 doses as needed for chest pain (if no relief after 3rd dose, proceed to ED or call 911)., Disp: 25 tablet, Rfl: 3   pantoprazole (PROTONIX) 40 MG tablet, Take 1 tablet (40 mg total) by mouth daily., Disp: 90 tablet, Rfl:  1   rosuvastatin (CRESTOR) 40 MG tablet, Take 1 tablet (40 mg total) by mouth daily., Disp: 90 tablet, Rfl: 1   vitamin C (ASCORBIC ACID) 500 MG tablet, Take 500 mg by mouth daily., Disp: , Rfl:    vitamin E 180 MG (400 UNITS) capsule, Take 400 Units by mouth daily., Disp: , Rfl:  No current facility-administered medications for this visit.  Facility-Administered Medications Ordered in Other Visits:    S[facility] dose#1 phenylephrine 1%/ketorolac 0.3% (OMIDRIA) in BSS 500 ML (MUST DILUTE PRIOR TO USE) Optime, , , PRN, Pecolia Ades, MD, 500 mL at 11/14/22 1054  Past Medical History: Past Medical History:  Diagnosis Date   COPD (chronic obstructive pulmonary disease) (HCC)    Coronary atherosclerosis of native coronary artery    DES RCA 5/09, nonobstructive left system   Essential hypertension    Hypothyroidism    Mixed hyperlipidemia    NSTEMI (non-ST elevated myocardial infarction) (HCC)    2009    Tobacco Use: Social History   Tobacco Use  Smoking Status Former   Current packs/day: 0.00   Average packs/day: 3.0 packs/day for 33.0 years (99.0 ttl pk-yrs)   Types: Cigarettes   Start date: 10/27/1972   Quit date: 2007   Years since quitting: 17.7  Smokeless Tobacco Never    Labs: Review Flowsheet  More data exists      Latest Ref Rng & Units 01/17/2021 07/29/2021 01/01/2022 07/03/2022 01/19/2023  Labs for ITP Cardiac and Pulmonary Rehab  Cholestrol 100 - 199 mg/dL 604  540  981  191  478   LDL (calc) 0 - 99 mg/dL 61  94  70  90  83   HDL-C >39 mg/dL 55  49  57  52  53   Trlycerides 0 - 149 mg/dL 295  621  308  657  846     Details            Capillary Blood Glucose: Lab Results  Component Value Date   GLUCAP 97 06/21/2018     Pulmonary Assessment Scores:  Pulmonary Assessment Scores     Row Name 06/08/23 0811         ADL UCSD   ADL Phase Entry     SOB Score total 54     Rest 0     Walk 3     Stairs 3     Bath 1     Dress 1     Shop 3       CAT  Score   CAT Score 18       mMRC Score   mMRC Score 3             UCSD: Self-administered rating of dyspnea associated with activities of daily living (ADLs) 6-point scale (0 = "not at all" to 5 = "maximal or unable to do because of breathlessness")  Scoring Scores range from 0 to 120.  Minimally important difference is 5 units  CAT: CAT can identify the health impairment of COPD patients and is better correlated with disease progression.  CAT has a scoring range of zero to 40. The CAT score is classified into four groups of low (less than 10), medium (10 - 20), high (21-30) and very high (31-40) based on the impact level of disease on health status. A CAT score over 10 suggests significant symptoms.  A worsening CAT score could be explained by an exacerbation, poor medication adherence, poor inhaler technique, or progression of COPD or comorbid conditions.  CAT MCID is 2 points  mMRC: mMRC (Modified Medical Research Council) Dyspnea Scale is used to assess the degree of baseline functional disability in patients of respiratory disease due to dyspnea. No minimal important difference is established. A decrease in score of 1 point or greater is considered a positive change.   Pulmonary Function Assessment:   Exercise Target Goals: Exercise Program Goal: Individual exercise prescription set using results from initial 6 min walk test and THRR while considering  patient's activity barriers and safety.   Exercise Prescription Goal: Initial exercise prescription builds to 30-45 minutes a day of aerobic activity, 2-3 days per week.  Home exercise guidelines will be given to patient during program as part of exercise prescription that the participant will acknowledge.  Activity Barriers & Risk Stratification:  Activity Barriers & Cardiac Risk Stratification - 05/29/23 0907       Activity Barriers & Cardiac Risk Stratification   Activity Barriers Shortness of Breath;Deconditioning     Cardiac Risk Stratification Moderate             6 Minute Walk:  6 Minute Walk     Row Name 06/04/23 1003         6 Minute Walk   Phase Initial     Distance 1060 feet     Walk Time 6 minutes     # of Rest Breaks 0     MPH 2.01  METS 2.66     RPE 11     Perceived Dyspnea  1     VO2 Peak 9.31     Symptoms No     Resting HR 67 bpm     Resting BP 110/60     Resting Oxygen Saturation  94 %     Exercise Oxygen Saturation  during 6 min walk 85 %     Max Ex. HR 90 bpm     Max Ex. BP 132/60     2 Minute Post BP 120/60       Interval HR   1 Minute HR 106     2 Minute HR 93     3 Minute HR 88     4 Minute HR 90     5 Minute HR 88     6 Minute HR 89     2 Minute Post HR 75     Interval Heart Rate? Yes       Interval Oxygen   Interval Oxygen? Yes     Baseline Oxygen Saturation % 94 %     1 Minute Oxygen Saturation % 88 %     1 Minute Liters of Oxygen 0 L     2 Minute Oxygen Saturation % 86 %     2 Minute Liters of Oxygen 0 L     3 Minute Oxygen Saturation % 86 %     3 Minute Liters of Oxygen 0 L     4 Minute Oxygen Saturation % 85 %     4 Minute Liters of Oxygen 0 L     5 Minute Oxygen Saturation % 86 %     5 Minute Liters of Oxygen 0 L     6 Minute Oxygen Saturation % 87 %     6 Minute Liters of Oxygen 0 L     2 Minute Post Oxygen Saturation % 94 %     2 Minute Post Liters of Oxygen 0 L              Oxygen Initial Assessment:   Oxygen Re-Evaluation:  Oxygen Re-Evaluation     Row Name 06/08/23 0802 06/15/23 0819           Program Oxygen Prescription   Program Oxygen Prescription -- None        Home Oxygen   Home Oxygen Device -- None      Sleep Oxygen Prescription -- None      Home Exercise Oxygen Prescription -- None      Home Resting Oxygen Prescription -- None        Goals/Expected Outcomes   Comments Reviewed PLB technique with pt.  Talked about how it works and it's importance in maintaining their exercise saturations. Breelyn is  doing well in the program so far. Her breathing is staying WNL when exrcising 88-90s. She feels her breathing improving each week and her energy increaing as well. She does breath through her mouth and is trying PLB.      Goals/Expected Outcomes Short: Become more profiecient at using PLB. Long: Become independent at using PLB. Short : continue to foucse on PLB when exercign    long: continue to monitor breathing and oxygen stats when exercising               Oxygen Discharge (Final Oxygen Re-Evaluation):  Oxygen Re-Evaluation - 06/15/23 0819       Program Oxygen Prescription   Program Oxygen Prescription None  Home Oxygen   Home Oxygen Device None    Sleep Oxygen Prescription None    Home Exercise Oxygen Prescription None    Home Resting Oxygen Prescription None      Goals/Expected Outcomes   Comments Jaylani is doing well in the program so far. Her breathing is staying WNL when exrcising 88-90s. She feels her breathing improving each week and her energy increaing as well. She does breath through her mouth and is trying PLB.    Goals/Expected Outcomes Short : continue to foucse on PLB when exercign    long: continue to monitor breathing and oxygen stats when exercising             Initial Exercise Prescription:  Initial Exercise Prescription - 06/04/23 1000       Date of Initial Exercise RX and Referring Provider   Date 06/04/23    Referring Provider Dr. Vassie Loll      Oxygen   Maintain Oxygen Saturation 88% or higher      Treadmill   MPH 2    Grade 0    Minutes 15      Recumbant Elliptical   Level 1    RPM 60    Minutes 15      Prescription Details   Frequency (times per week) 2    Duration Progress to 30 minutes of continuous aerobic without signs/symptoms of physical distress      Intensity   THRR 40-80% of Max Heartrate 103-138    Ratings of Perceived Exertion 11-13    Perceived Dyspnea 0-4      Resistance Training   Training Prescription Yes     Weight 3    Reps 10-15             Perform Capillary Blood Glucose checks as needed.  Exercise Prescription Changes:   Exercise Prescription Changes     Row Name 06/15/23 1300             Response to Exercise   Blood Pressure (Admit) 118/70       Blood Pressure (Exercise) 126/70       Blood Pressure (Exit) 100/70       Heart Rate (Admit) 73 bpm       Heart Rate (Exercise) 85 bpm       Heart Rate (Exit) 75 bpm       Oxygen Saturation (Admit) 95 %       Oxygen Saturation (Exercise) 88 %       Oxygen Saturation (Exit) 94 %       Rating of Perceived Exertion (Exercise) 12       Perceived Dyspnea (Exercise) 1       Duration Continue with 30 min of aerobic exercise without signs/symptoms of physical distress.       Intensity THRR unchanged         Progression   Progression Continue to progress workloads to maintain intensity without signs/symptoms of physical distress.         Resistance Training   Training Prescription Yes       Weight 3 lbs       Reps 10-15       Time 1 Minutes         Treadmill   MPH 2       Grade 0       Minutes 15       METs 2.53         Recumbant Elliptical   Level 3  RPM 47       Minutes 15       METs 2.3         Oxygen   Maintain Oxygen Saturation 88% or higher                Exercise Comments:   Exercise Comments     Row Name 06/17/23 0814           Exercise Comments Reviewed home exercise                Exercise Goals and Review:   Exercise Goals     Row Name 06/04/23 1005             Exercise Goals   Increase Physical Activity Yes       Intervention Provide advice, education, support and counseling about physical activity/exercise needs.;Develop an individualized exercise prescription for aerobic and resistive training based on initial evaluation findings, risk stratification, comorbidities and participant's personal goals.       Expected Outcomes Short Term: Attend rehab on a regular basis to  increase amount of physical activity.;Long Term: Add in home exercise to make exercise part of routine and to increase amount of physical activity.;Long Term: Exercising regularly at least 3-5 days a week.       Increase Strength and Stamina Yes       Intervention Provide advice, education, support and counseling about physical activity/exercise needs.;Develop an individualized exercise prescription for aerobic and resistive training based on initial evaluation findings, risk stratification, comorbidities and participant's personal goals.       Expected Outcomes Short Term: Increase workloads from initial exercise prescription for resistance, speed, and METs.;Short Term: Perform resistance training exercises routinely during rehab and add in resistance training at home;Long Term: Improve cardiorespiratory fitness, muscular endurance and strength as measured by increased METs and functional capacity ( )       Able to understand and use rate of perceived exertion (RPE) scale Yes       Intervention Provide education and explanation on how to use RPE scale       Expected Outcomes Short Term: Able to use RPE daily in rehab to express subjective intensity level;Long Term:  Able to use RPE to guide intensity level when exercising independently       Able to understand and use Dyspnea scale Yes       Intervention Provide education and explanation on how to use Dyspnea scale       Expected Outcomes Short Term: Able to use Dyspnea scale daily in rehab to express subjective sense of shortness of breath during exertion;Long Term: Able to use Dyspnea scale to guide intensity level when exercising independently       Knowledge and understanding of Target Heart Rate Range (THRR) Yes       Intervention Provide education and explanation of THRR including how the numbers were predicted and where they are located for reference       Expected Outcomes Short Term: Able to use daily as guideline for intensity in  rehab;Short Term: Able to state/look up THRR;Long Term: Able to use THRR to govern intensity when exercising independently       Able to check pulse independently Yes       Intervention Provide education and demonstration on how to check pulse in carotid and radial arteries.;Review the importance of being able to check your own pulse for safety during independent exercise       Expected Outcomes Short Term:  Able to explain why pulse checking is important during independent exercise;Long Term: Able to check pulse independently and accurately       Understanding of Exercise Prescription Yes       Intervention Provide education, explanation, and written materials on patient's individual exercise prescription       Expected Outcomes Short Term: Able to explain program exercise prescription;Long Term: Able to explain home exercise prescription to exercise independently                Exercise Goals Re-Evaluation :  Exercise Goals Re-Evaluation     Row Name 06/08/23 0800 06/15/23 0757 06/15/23 1332         Exercise Goal Re-Evaluation   Exercise Goals Review Increase Physical Activity;Increase Strength and Stamina;Able to understand and use Dyspnea scale;Understanding of Exercise Prescription Increase Physical Activity;Increase Strength and Stamina;Understanding of Exercise Prescription Increase Physical Activity;Increase Strength and Stamina;Understanding of Exercise Prescription     Comments Reviewed RPE  and dyspnea scale, THR and program prescription with pt today.  Pt voiced understanding and was given a copy of goals to take home. Glenyce has just started the program in the past week. She has been doing great in the program so far. She continues to walk everyday. She stated that she feels better and has more energy. She enjoys coming to rehab and aslo likes the social aspect. Akina has tolerated exercise well. She is increaing her level on the XR to level 3. She is also walking everyday. Will  continue to monitor and progress as able.     Expected Outcomes Short: Use RPE daily to regulate intensity. Long: Follow program prescription in THR. Short term: go over home exercise   long term: continue to attend rehab Short: continue to exercise at RPE of 12 then increase workload    Long term: continue to attend rehab              Discharge Exercise Prescription (Final Exercise Prescription Changes):  Exercise Prescription Changes - 06/15/23 1300       Response to Exercise   Blood Pressure (Admit) 118/70    Blood Pressure (Exercise) 126/70    Blood Pressure (Exit) 100/70    Heart Rate (Admit) 73 bpm    Heart Rate (Exercise) 85 bpm    Heart Rate (Exit) 75 bpm    Oxygen Saturation (Admit) 95 %    Oxygen Saturation (Exercise) 88 %    Oxygen Saturation (Exit) 94 %    Rating of Perceived Exertion (Exercise) 12    Perceived Dyspnea (Exercise) 1    Duration Continue with 30 min of aerobic exercise without signs/symptoms of physical distress.    Intensity THRR unchanged      Progression   Progression Continue to progress workloads to maintain intensity without signs/symptoms of physical distress.      Resistance Training   Training Prescription Yes    Weight 3 lbs    Reps 10-15    Time 1 Minutes      Treadmill   MPH 2    Grade 0    Minutes 15    METs 2.53      Recumbant Elliptical   Level 3    RPM 47    Minutes 15    METs 2.3      Oxygen   Maintain Oxygen Saturation 88% or higher             Nutrition:  Target Goals: Understanding of nutrition guidelines, daily intake of sodium <  1500mg , cholesterol 200mg , calories 30% from fat and 7% or less from saturated fats, daily to have 5 or more servings of fruits and vegetables.  Biometrics:  Pre Biometrics - 06/04/23 1006       Pre Biometrics   Height 5' 1.5" (1.562 m)    Weight 142 lb 10.2 oz (64.7 kg)    Waist Circumference 33.5 inches    Hip Circumference 38 inches    Waist to Hip Ratio 0.88 %    BMI  (Calculated) 26.52    Grip Strength 25.3 kg              Nutrition Therapy Plan and Nutrition Goals:   Nutrition Assessments:  MEDIFICTS Score Key: >=70 Need to make dietary changes  40-70 Heart Healthy Diet <= 40 Therapeutic Level Cholesterol Diet  Flowsheet Row PULMONARY REHAB CHRONIC OBSTRUCTIVE PULMONARY DISEASE from 06/08/2023 in [facility] CARDIAC REHABILITATION  Picture Your Plate Total Score on Admission 13      Picture Your Plate Scores: <16 Unhealthy dietary pattern with much room for improvement. 41-50 Dietary pattern unlikely to meet recommendations for good health and room for improvement. 51-60 More healthful dietary pattern, with some room for improvement.  >60 Healthy dietary pattern, although there may be some specific behaviors that could be improved.    Nutrition Goals Re-Evaluation:  Nutrition Goals Re-Evaluation     Row Name 06/15/23 [phone]             Goals   Nutrition Goal Healthy eating       Comment Briseida has been working on her healthy eating. She has started to cook veggie and has learned she likes peppers. She eats more chicken within the week and it is normally baked or grilled. She stated that she does not eat a lot of sweets. She is happy with her progress in healthy eating.       Expected Outcome Short term: continue to cut down on chips   long term: continue to eat healthy                Nutrition Goals Discharge (Final Nutrition Goals Re-Evaluation):  Nutrition Goals Re-Evaluation - 06/15/23 0812       Goals   Nutrition Goal Healthy eating    Comment Junette has been working on her healthy eating. She has started to cook veggie and has learned she likes peppers. She eats more chicken within the week and it is normally baked or grilled. She stated that she does not eat a lot of sweets. She is happy with her progress in healthy eating.    Expected Outcome Short term: continue to cut down on chips   long term: continue to eat  healthy             Psychosocial: Target Goals: Acknowledge presence or absence of significant depression and/or stress, maximize coping skills, provide positive support system. Participant is able to verbalize types and ability to use techniques and skills needed for reducing stress and depression.  Initial Review & Psychosocial Screening:  Initial Psych Review & Screening - 05/29/23 0936       Initial Review   Current issues with Current Sleep Concerns      Family Dynamics   Good Support System? Yes      Barriers   Psychosocial barriers to participate in program There are no identifiable barriers or psychosocial needs.      Screening Interventions   Interventions Encouraged to exercise;To provide support and resources with  identified psychosocial needs;Provide feedback about the scores to participant    Expected Outcomes Short Term goal: Utilizing psychosocial counselor, staff and physician to assist with identification of specific Stressors or current issues interfering with healing process. Setting desired goal for each stressor or current issue identified.;Long Term Goal: Stressors or current issues are controlled or eliminated.;Short Term goal: Identification and review with participant of any Quality of Life or Depression concerns found by scoring the questionnaire.;Long Term goal: The participant improves quality of Life and PHQ9 Scores as seen by post scores and/or verbalization of changes             Quality of Life Scores:  Scores of 19 and below usually indicate a poorer quality of life in these areas.  A difference of  2-3 points is a clinically meaningful difference.  A difference of 2-3 points in the total score of the Quality of Life Index has been associated with significant improvement in overall quality of life, self-image, physical symptoms, and general health in studies assessing change in quality of life.   PHQ-9: Review Flowsheet  More data exists       06/04/2023 03/05/2023 02/12/2023 01/23/2023 01/21/2023  Depression screen PHQ 2/9  Decreased Interest 0 0 0 0 0  Down, Depressed, Hopeless 0 0 0 0 0  PHQ - 2 Score 0 0 0 0 0  Altered sleeping 1 0 - - -  Tired, decreased energy 0 1 - - -  Change in appetite 0 0 - - -  Feeling bad or failure about yourself  0 0 - - -  Trouble concentrating 0 0 - - -  Moving slowly or fidgety/restless 0 0 - - -  Suicidal thoughts 0 0 - - -  PHQ-9 Score 1 1 - - -  Difficult doing work/chores Not difficult at all Somewhat difficult - - -    Details           Interpretation of Total Score  Total Score Depression Severity:  1-4 = Minimal depression, 5-9 = Mild depression, 10-14 = Moderate depression, 15-19 = Moderately severe depression, 20-27 = Severe depression   Psychosocial Evaluation and Intervention:  Psychosocial Evaluation - 05/29/23 0936       Psychosocial Evaluation & Interventions   Interventions Stress management education;Relaxation education;Encouraged to exercise with the program and follow exercise prescription    Comments 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 She lives with her husband of 42 years and they have a son and 2 grandchildren. Her husband and grandchildren are her main support people. She tries to live a very active life but has not been able to do as much as she would like recently due to her advancing COPD. She does walk everyday 30 mins. She denies any depression, anxiety or stress. She does have trouble staying asleep some nights. She recently started taking benadryl and was taking the generic brand from [facility]. She did not realize she was taking benadryl and ask if this was safe.  I advised her to speak with her pcp about her concerns about taking benadryl. She has tried melotonin in the past and it did not help. She said she had been reluctant to participate in the program for a long time eventhough Dr. Vassie Loll had recommended it but she saw a video on you-tube  about pulmonary rehabilitation and its benefits and decided to give it a try. She does have a $15 dollar copayment and she was not sure if this was  going to be a barrier but said she wanted to do as much as she could of the program. No other barriers identified. Her main goals for the program are to get her lungs stronger and be able to do all the activities she wants to do. She wants to be able to help her husband mow the yard. She said she was not able to go out onto the beach with her grandchildren this summer for the first time since they were born, 14 years, due to her SOB and this really brothered her. She would like to get her lungs strong enough to participate in these activities.    Expected Outcomes Short Term: Start and attend the program consistently. Long Term: Get her lung stronger and be able to do all the activities she wants to do.    Continue Psychosocial Services  Follow up required by staff             Psychosocial Re-Evaluation:  Psychosocial Re-Evaluation     Row Name 06/15/23 0806             Psychosocial Re-Evaluation   Current issues with Current Sleep Concerns       Comments Ginna said that she was taking an sleep aid she got from [employer] and when talking to her PCP they noticed it was a form of benadryl. She has started to not take anything for sleep. She said she will go to sleep pretty fast but will wake up within 2 hours and her mind will start racing. SHe has no other barriors       Expected Outcomes Shor term: continue to work on sleep    Long term: continue to have no barriors       Interventions Stress management education;Relaxation education;Encouraged to attend Pulmonary Rehabilitation for the exercise       Continue Psychosocial Services  Follow up required by staff                Psychosocial Discharge (Final Psychosocial Re-Evaluation):  Psychosocial Re-Evaluation - 06/15/23 0806       Psychosocial Re-Evaluation   Current issues with  Current Sleep Concerns    Comments Ivelis said that she was taking an sleep aid she got from [employer] and when talking to her PCP they noticed it was a form of benadryl. She has started to not take anything for sleep. She said she will go to sleep pretty fast but will wake up within 2 hours and her mind will start racing. SHe has no other barriors    Expected Outcomes Shor term: continue to work on sleep    Long term: continue to have no barriors    Interventions Stress management education;Relaxation education;Encouraged to attend Pulmonary Rehabilitation for the exercise    Continue Psychosocial Services  Follow up required by staff              Education: Education Goals: Education classes will be provided on a weekly basis, covering required topics. Participant will state understanding/return demonstration of topics presented.  Learning Barriers/Preferences:  Learning Barriers/Preferences - 05/29/23 0935       Learning Barriers/Preferences   Learning Barriers None    Learning Preferences Audio;Written Material             Education Topics: How Lungs Work and Diseases: - Discuss the anatomy of the lungs and diseases that can affect the lungs, such as COPD.   Exercise: -Discuss the importance of exercise, FITT principles of exercise, normal  and abnormal responses to exercise, and how to exercise safely.   Environmental Irritants: -Discuss types of environmental irritants and how to limit exposure to environmental irritants.   Meds/Inhalers and oxygen: - Discuss respiratory medications, definition of an inhaler and oxygen, and the proper way to use an inhaler and oxygen.   Energy Saving Techniques: - Discuss methods to conserve energy and decrease shortness of breath when performing activities of daily living.    Bronchial Hygiene / Breathing Techniques: - Discuss breathing mechanics, pursed-lip breathing technique,  proper posture, effective ways to clear  airways, and other functional breathing techniques   Cleaning Equipment: - Provides group verbal and written instruction about the health risks of elevated stress, cause of high stress, and healthy ways to reduce stress.   Nutrition I: Fats: - Discuss the types of cholesterol, what cholesterol does to the body, and how cholesterol levels can be controlled.   Nutrition II: Labels: -Discuss the different components of food labels and how to read food labels.   Respiratory Infections: - Discuss the signs and symptoms of respiratory infections, ways to prevent respiratory infections, and the importance of seeking medical treatment when having a respiratory infection.   Stress I: Signs and Symptoms: - Discuss the causes of stress, how stress may lead to anxiety and depression, and ways to limit stress.   Stress II: Relaxation: -Discuss relaxation techniques to limit stress.   Oxygen for Home/Travel: - Discuss how to prepare for travel when on oxygen and proper ways to transport and store oxygen to ensure safety.   Knowledge Questionnaire Score:  Knowledge Questionnaire Score - 06/08/23 0812       Knowledge Questionnaire Score   Pre Score 15/18             Core Components/Risk Factors/Patient Goals at Admission:  Personal Goals and Risk Factors at Admission - 05/29/23 0935       Core Components/Risk Factors/Patient Goals on Admission    Weight Management Weight Maintenance    Improve shortness of breath with ADL's Yes    Intervention Provide education, individualized exercise plan and daily activity instruction to help decrease symptoms of SOB with activities of daily living.    Expected Outcomes Short Term: Improve cardiorespiratory fitness to achieve a reduction of symptoms when performing ADLs;Long Term: Be able to perform more ADLs without symptoms or delay the onset of symptoms    Increase knowledge of respiratory medications and ability to use respiratory devices  properly  Yes    Intervention Provide education and demonstration as needed of appropriate use of medications, inhalers, and oxygen therapy.    Expected Outcomes Short Term: Achieves understanding of medications use. Understands that oxygen is a medication prescribed by physician. Demonstrates appropriate use of inhaler and oxygen therapy.;Long Term: Maintain appropriate use of medications, inhalers, and oxygen therapy.    Hypertension Yes    Intervention Provide education on lifestyle modifcations including regular physical activity/exercise, weight management, moderate sodium restriction and increased consumption of fresh fruit, vegetables, and low fat dairy, alcohol moderation, and smoking cessation.;Monitor prescription use compliance.    Expected Outcomes Short Term: Continued assessment and intervention until BP is < 140/35mm HG in hypertensive participants. < 130/72mm HG in hypertensive participants with diabetes, heart failure or chronic kidney disease.;Long Term: Maintenance of blood pressure at goal levels.    Lipids Yes    Intervention Provide education and support for participant on nutrition & aerobic/resistive exercise along with prescribed medications to achieve LDL 70mg , HDL >40mg .  Expected Outcomes Short Term: Participant states understanding of desired cholesterol values and is compliant with medications prescribed. Participant is following exercise prescription and nutrition guidelines.;Long Term: Cholesterol controlled with medications as prescribed, with individualized exercise RX and with personalized nutrition plan. Value goals: LDL < 70mg , HDL > 40 mg.             Core Components/Risk Factors/Patient Goals Review:   Goals and Risk Factor Review     Row Name 06/15/23 0822             Core Components/Risk Factors/Patient Goals Review   Personal Goals Review Improve shortness of breath with ADL's;Weight Management/Obesity       Review Tianni is doing good with  exercise. She is working on [employer] with exercising by walking everyday and eating healthy. She stated that she wants to keep around the same weight she is and does not want to gain. She is also focusing on PLB to help improve her SOB. She has noticed she breaths through her mouth a lot and is trying to remeber PLB. She stated that she has felt better since exercising.       Expected Outcomes Short term: comtinue to focuse of PLB and weight managment   long term: continue to enjoy exercising and walking at home                Core Components/Risk Factors/Patient Goals at Discharge (Final Review):   Goals and Risk Factor Review - 06/15/23 4098       Core Components/Risk Factors/Patient Goals Review   Personal Goals Review Improve shortness of breath with ADL's;Weight Management/Obesity    Review Allyson is doing good with exercise. She is working on [employer] with exercising by walking everyday and eating healthy. She stated that she wants to keep around the same weight she is and does not want to gain. She is also focusing on PLB to help improve her SOB. She has noticed she breaths through her mouth a lot and is trying to remeber PLB. She stated that she has felt better since exercising.    Expected Outcomes Short term: comtinue to focuse of PLB and weight managment   long term: continue to enjoy exercising and walking at home             ITP Comments:  ITP Comments     Row Name 06/08/23 0759 07/01/23 0643         ITP Comments First full day of exercise!  Patient was oriented to gym and equipment including functions, settings, policies, and procedures.  Patient's individual exercise prescription and treatment plan were reviewed.  All starting workloads were established based on the results of the 6 minute walk test done at initial orientation visit.  The plan for exercise progression was also introduced and progression will be customized based on patient's performance  and goals 30 day review completed. ITP sent to Dr.Jehanzeb Memon, Medical Director of  Pulmonary Rehab. Continue with ITP unless changes are made by physician.               Comments: 30 day review

## 2023-07-06 ENCOUNTER — Encounter (HOSPITAL_COMMUNITY)
Admission: RE | Admit: 2023-07-06 | Discharge: 2023-07-06 | Disposition: A | Discharge status: Home / Self Care | Payer: Medicare Other | Source: Ambulatory Visit | Attending: Pulmonary Disease

## 2023-07-06 DIAGNOSIS — J4489 Other specified chronic obstructive pulmonary disease: Secondary | ICD-10-CM

## 2023-07-06 NOTE — Progress Notes (Signed)
Daily Session Note  Patient Details  Name: KAOIR LOREE MRN: [id number] Date of Birth: 1961/04/02 Referring Provider:   Flowsheet Row PULMONARY REHAB COPD ORIENTATION from 06/04/2023 in [facility] CARDIAC REHABILITATION  Referring Provider Dr. Vassie Loll       Encounter Date: 07/06/2023  Check In:  Session Check In - 07/06/23 0745       Check-In   Supervising physician immediately available to respond to emergencies See telemetry face sheet for immediately available MD    Location [facility]    Staff Present Ross Ludwig, BS, Exercise Physiologist;Jessica Juanetta Gosling, MA, RCEP, CCRP, CCET;Other    Virtual Visit No    Medication changes reported     No    Fall or balance concerns reported    No    Tobacco Cessation No Change    Warm-up and Cool-down Performed on first and last piece of equipment    Resistance Training Performed Yes    VAD Patient? No    PAD/SET Patient? No      Pain Assessment   Currently in Pain? No/denies    Multiple Pain Sites No             Capillary Blood Glucose: No results found for this or any previous visit (from the past 24 hour(s)).    Social History   Tobacco Use  Smoking Status Former   Current packs/day: 0.00   Average packs/day: 3.0 packs/day for 33.0 years (99.0 ttl pk-yrs)   Types: Cigarettes   Start date: 10/27/1972   Quit date: 2007   Years since quitting: 17.7  Smokeless Tobacco Never    Goals Met:  Independence with exercise equipment Exercise tolerated well No report of concerns or symptoms today Strength training completed today  Goals Unmet:  Not Applicable  Comments: Pt able to follow exercise prescription today without complaint.  Will continue to monitor for progression.

## 2023-07-06 NOTE — Progress Notes (Signed)
Reviewed home exercise with pt today.  Pt plans to walk and use Cardioglide at home for exercise.  She also has a exercise bike in her basement.  Reviewed THR, pulse, RPE, sign and symptoms, pulse oximetery and when to call 911 or MD.  Also discussed weather considerations and indoor options.  Pt voiced understanding.

## 2023-07-08 ENCOUNTER — Encounter (HOSPITAL_COMMUNITY)
Admission: RE | Admit: 2023-07-08 | Discharge: 2023-07-08 | Disposition: A | Discharge status: Home / Self Care | Payer: Medicare Other | Source: Ambulatory Visit | Attending: Pulmonary Disease | Admitting: Pulmonary Disease

## 2023-07-08 DIAGNOSIS — J4489 Other specified chronic obstructive pulmonary disease: Secondary | ICD-10-CM | POA: Diagnosis not present

## 2023-07-08 NOTE — Progress Notes (Signed)
Daily Session Note  Patient Details  Name: [name] MRN: [id number] Date of Birth: 12/21/1960 Referring Provider:   Flowsheet Row PULMONARY REHAB COPD ORIENTATION from 06/04/2023 in [facility] CARDIAC REHABILITATION  Referring Provider Dr. Vassie Loll       Encounter Date: 07/08/2023  Check In:  Session Check In - 07/08/23 0817       Check-In   Supervising physician immediately available to respond to emergencies See telemetry face sheet for immediately available MD    Location [facility]    Staff Present Ross Ludwig, BS, Exercise Physiologist;Debra Laural Benes, RN, Thomos Lemons, MA, RCEP, CCRP, CCET    Virtual Visit No    Medication changes reported     No    Fall or balance concerns reported    No    Warm-up and Cool-down Performed on first and last piece of equipment    Resistance Training Performed Yes    VAD Patient? No    PAD/SET Patient? No      Pain Assessment   Currently in Pain? No/denies             Capillary Blood Glucose: No results found for this or any previous visit (from the past 24 hour(s)).    Social History   Tobacco Use  Smoking Status Former   Current packs/day: 0.00   Average packs/day: 3.0 packs/day for 33.0 years (99.0 ttl pk-yrs)   Types: Cigarettes   Start date: 10/27/1972   Quit date: 2007   Years since quitting: 17.8  Smokeless Tobacco Never    Goals Met:  Proper associated with RPD/PD & O2 Sat Independence with exercise equipment Exercise tolerated well No report of concerns or symptoms today Strength training completed today  Goals Unmet:  Not Applicable  Comments: Pt able to follow exercise prescription today without complaint.  Will continue to monitor for progression.

## 2023-07-13 ENCOUNTER — Encounter (HOSPITAL_COMMUNITY)
Admission: RE | Admit: 2023-07-13 | Discharge: 2023-07-13 | Disposition: A | Discharge status: Home / Self Care | Payer: Medicare Other | Source: Ambulatory Visit | Attending: Pulmonary Disease | Admitting: Pulmonary Disease

## 2023-07-13 DIAGNOSIS — J4489 Other specified chronic obstructive pulmonary disease: Secondary | ICD-10-CM

## 2023-07-13 NOTE — Progress Notes (Signed)
Daily Session Note  Patient Details  Name: [name] MRN: [id number] Date of Birth: 09-02-1961 Referring Provider:   Flowsheet Row PULMONARY REHAB COPD ORIENTATION from 06/04/2023 in [facility] CARDIAC REHABILITATION  Referring Provider Dr. Vassie Loll       Encounter Date: 07/13/2023  Check In:  Session Check In - 07/13/23 0756       Check-In   Supervising physician immediately available to respond to emergencies See telemetry face sheet for immediately available ER MD    Location [facility]    Staff Present Ross Ludwig, BS, Exercise Physiologist;Glennice Marcos Juanetta Gosling, MA, RCEP, CCRP, CCET;Phyllis Billingsley, RN    Virtual Visit No    Medication changes reported     No    Fall or balance concerns reported    No    Warm-up and Cool-down Performed on first and last piece of equipment    Resistance Training Performed Yes    VAD Patient? No    PAD/SET Patient? No      Pain Assessment   Currently in Pain? No/denies             Capillary Blood Glucose: No results found for this or any previous visit (from the past 24 hour(s)).    Social History   Tobacco Use  Smoking Status Former   Current packs/day: 0.00   Average packs/day: 3.0 packs/day for 33.0 years (99.0 ttl pk-yrs)   Types: Cigarettes   Start date: 10/27/1972   Quit date: 2007   Years since quitting: 17.8  Smokeless Tobacco Never    Goals Met:  Proper associated with RPD/PD & O2 Sat Independence with exercise equipment Using PLB without cueing & demonstrates good technique Exercise tolerated well No report of concerns or symptoms today Strength training completed today  Goals Unmet:  Not Applicable  Comments: Pt able to follow exercise prescription today without complaint.  Will continue to monitor for progression.

## 2023-07-15 ENCOUNTER — Encounter (HOSPITAL_COMMUNITY)
Admission: RE | Admit: 2023-07-15 | Discharge: 2023-07-15 | Disposition: A | Discharge status: Home / Self Care | Payer: Medicare Other | Source: Ambulatory Visit | Attending: Pulmonary Disease | Admitting: Pulmonary Disease

## 2023-07-15 DIAGNOSIS — J4489 Other specified chronic obstructive pulmonary disease: Secondary | ICD-10-CM | POA: Diagnosis not present

## 2023-07-15 NOTE — Progress Notes (Signed)
Daily Session Note  Patient Details  Name: [name] MRN: [id number] Date of Birth: Sep 06, 1961 Referring Provider:   Flowsheet Row PULMONARY REHAB COPD ORIENTATION from 06/04/2023 in [facility] CARDIAC REHABILITATION  Referring Provider Dr. Vassie Loll       Encounter Date: 07/15/2023  Check In:  Session Check In - 07/15/23 0802       Check-In   Supervising physician immediately available to respond to emergencies See telemetry face sheet for immediately available MD    Location [facility]    Staff Present Ross Ludwig, BS, Exercise Physiologist;Hillary Caroleen BSN, RN;Lindamarie Maclachlan Malone, MA, RCEP, CCRP, CCET    Virtual Visit No    Medication changes reported     No    Fall or balance concerns reported    No    Warm-up and Cool-down Performed on first and last piece of equipment    Resistance Training Performed Yes    VAD Patient? No    PAD/SET Patient? No      Pain Assessment   Currently in Pain? No/denies             Capillary Blood Glucose: No results found for this or any previous visit (from the past 24 hour(s)).    Social History   Tobacco Use  Smoking Status Former   Current packs/day: 0.00   Average packs/day: 3.0 packs/day for 33.0 years (99.0 ttl pk-yrs)   Types: Cigarettes   Start date: 10/27/1972   Quit date: 2007   Years since quitting: 17.8  Smokeless Tobacco Never    Goals Met:  Proper associated with RPD/PD & O2 Sat Independence with exercise equipment Using PLB without cueing & demonstrates good technique Exercise tolerated well No report of concerns or symptoms today Strength training completed today  Goals Unmet:  Not Applicable  Comments: Pt able to follow exercise prescription today without complaint.  Will continue to monitor for progression.

## 2023-07-20 ENCOUNTER — Encounter (HOSPITAL_COMMUNITY)
Admission: RE | Admit: 2023-07-20 | Discharge: 2023-07-20 | Disposition: A | Discharge status: Home / Self Care | Payer: Medicare Other | Source: Ambulatory Visit | Attending: Pulmonary Disease | Admitting: Pulmonary Disease

## 2023-07-20 DIAGNOSIS — J4489 Other specified chronic obstructive pulmonary disease: Secondary | ICD-10-CM

## 2023-07-20 NOTE — Progress Notes (Signed)
Daily Session Note  Patient Details  Name: [name] MRN: [id number] Date of Birth: 1960-11-05 Referring Provider:   Flowsheet Row PULMONARY REHAB COPD ORIENTATION from 06/04/2023 in [facility] CARDIAC REHABILITATION  Referring Provider Dr. Vassie Loll       Encounter Date: 07/20/2023  Check In:  Session Check In - 07/20/23 0757       Check-In   Supervising physician immediately available to respond to emergencies See telemetry face sheet for immediately available ER MD    Location [facility]-Cardiac & Pulmonary Rehab    Staff Present Fabio Pierce, MA, RCEP, CCRP, CCET    Staff Present Cyndia Diver, RN, BSN, MA    Virtual Visit No    Medication changes reported     No    Warm-up and Cool-down Performed on first and last piece of equipment    Resistance Training Performed Yes    VAD Patient? No    PAD/SET Patient? No      Pain Assessment   Currently in Pain? No/denies             Capillary Blood Glucose: No results found for this or any previous visit (from the past 24 hour(s)).    Social History   Tobacco Use  Smoking Status Former   Current packs/day: 0.00   Average packs/day: 3.0 packs/day for 33.0 years (99.0 ttl pk-yrs)   Types: Cigarettes   Start date: 10/27/1972   Quit date: 2007   Years since quitting: 17.8  Smokeless Tobacco Never    Goals Met:  Proper associated with RPD/PD & O2 Sat Independence with exercise equipment Using PLB without cueing & demonstrates good technique Exercise tolerated well No report of concerns or symptoms today Strength training completed today  Goals Unmet:  Not Applicable  Comments: Pt able to follow exercise prescription today without complaint.  Will continue to monitor for progression.

## 2023-07-21 ENCOUNTER — Ambulatory Visit: Payer: Medicare Other | Admitting: Pulmonary Disease

## 2023-07-22 ENCOUNTER — Encounter (HOSPITAL_COMMUNITY)
Admission: RE | Admit: 2023-07-22 | Discharge: 2023-07-22 | Disposition: A | Discharge status: Home / Self Care | Payer: Medicare Other | Source: Ambulatory Visit | Attending: Pulmonary Disease | Admitting: Pulmonary Disease

## 2023-07-22 DIAGNOSIS — J4489 Other specified chronic obstructive pulmonary disease: Secondary | ICD-10-CM

## 2023-07-22 NOTE — Progress Notes (Signed)
Daily Session Note  Patient Details  Name: [name] MRN: [id number] Date of Birth: December 02, 1960 Referring Provider:   Flowsheet Row PULMONARY REHAB COPD ORIENTATION from 06/04/2023 in [facility] CARDIAC REHABILITATION  Referring Provider Dr. Vassie Loll       Encounter Date: 07/22/2023  Check In:  Session Check In - 07/22/23 0803       Check-In   Supervising physician immediately available to respond to emergencies See telemetry face sheet for immediately available MD    Location [facility]    Staff Present Rodena Medin, RN, BSN;Bettylee Feig Juanetta Gosling, MA, RCEP, CCRP, CCET;Heather Fredric Mare, [place], Exercise Physiologist    Virtual Visit No    Medication changes reported     No    Fall or balance concerns reported    No    Warm-up and Cool-down Performed on first and last piece of equipment    Resistance Training Performed Yes    VAD Patient? No    PAD/SET Patient? No      Pain Assessment   Currently in Pain? No/denies             Capillary Blood Glucose: No results found for this or any previous visit (from the past 24 hour(s)).    Social History   Tobacco Use  Smoking Status Former   Current packs/day: 0.00   Average packs/day: 3.0 packs/day for 33.0 years (99.0 ttl pk-yrs)   Types: Cigarettes   Start date: 10/27/1972   Quit date: 2007   Years since quitting: 17.8  Smokeless Tobacco Never    Goals Met:  Proper associated with RPD/PD & O2 Sat Independence with exercise equipment Using PLB without cueing & demonstrates good technique Personal goals reviewed No report of concerns or symptoms today Strength training completed today  Goals Unmet:  Not Applicable  Comments: Pt able to follow exercise prescription today without complaint.  Will continue to monitor for progression.

## 2023-07-23 ENCOUNTER — Telehealth: Payer: Self-pay | Admitting: Pharmacist

## 2023-07-23 NOTE — Telephone Encounter (Signed)
Pharmacy Quality Measure Review   This patient is appearing on a report for being at risk of failing the adherence measure for statins this calendar year.   Medication: rosuvastatin Last fill date: 04/28/23 for 90 day supply   Called patient to discuss medications. Left VM encouraging return call Statin increased from 20mg  in March (gap in fill history from march to August)  Lipid Panel     Component Value Date/Time   CHOL 159 01/19/2023 0838   TRIG 131 01/19/2023 0838   HDL 53 01/19/2023 0838   CHOLHDL 3.0 01/19/2023 0838   CHOLHDL 2.8 03/01/2020 0730   VLDL 22 01/20/2017 0724   LDLCALC 83 01/19/2023 0838   LDLCALC 83 03/01/2020 0730   LABVLDL 23 01/19/2023 1610          Kieth Brightly, PharmD, BCACP, CPP Clinical Pharmacist, [facility]

## 2023-07-27 ENCOUNTER — Encounter (HOSPITAL_COMMUNITY)
Admission: RE | Admit: 2023-07-27 | Discharge: 2023-07-27 | Disposition: A | Discharge status: Home / Self Care | Payer: Medicare Other | Source: Ambulatory Visit | Attending: Pulmonary Disease | Admitting: Pulmonary Disease

## 2023-07-27 DIAGNOSIS — Z79899 Other long term (current) drug therapy: Secondary | ICD-10-CM | POA: Diagnosis not present

## 2023-07-27 DIAGNOSIS — J4489 Other specified chronic obstructive pulmonary disease: Secondary | ICD-10-CM | POA: Diagnosis not present

## 2023-07-27 DIAGNOSIS — E782 Mixed hyperlipidemia: Secondary | ICD-10-CM | POA: Diagnosis not present

## 2023-07-27 DIAGNOSIS — E038 Other specified hypothyroidism: Secondary | ICD-10-CM | POA: Diagnosis not present

## 2023-07-27 NOTE — Progress Notes (Signed)
Daily Session Note  Patient Details  Name: [name] MRN: [id number] Date of Birth: June 06, 1961 Referring Provider:   Flowsheet Row PULMONARY REHAB COPD ORIENTATION from 06/04/2023 in [facility] CARDIAC REHABILITATION  Referring Provider Dr. Vassie Loll       Encounter Date: 07/27/2023  Check In:  Session Check In - 07/27/23 0745       Check-In   Supervising physician immediately available to respond to emergencies See telemetry face sheet for immediately available MD    Location [facility]    Staff Present Ross Ludwig, BS, Exercise Physiologist;Jessica Juanetta Gosling, MA, RCEP, CCRP, CCET    Virtual Visit No    Medication changes reported     No    Fall or balance concerns reported    No    Tobacco Cessation No Change    Warm-up and Cool-down Performed on first and last piece of equipment    Resistance Training Performed Yes    VAD Patient? No    PAD/SET Patient? No      Pain Assessment   Currently in Pain? No/denies    Multiple Pain Sites No             Capillary Blood Glucose: No results found for this or any previous visit (from the past 24 hour(s)).    Social History   Tobacco Use  Smoking Status Former   Current packs/day: 0.00   Average packs/day: 3.0 packs/day for 33.0 years (99.0 ttl pk-yrs)   Types: Cigarettes   Start date: 10/27/1972   Quit date: 2007   Years since quitting: 17.8  Smokeless Tobacco Never    Goals Met:  Independence with exercise equipment Using PLB without cueing & demonstrates good technique Exercise tolerated well No report of concerns or symptoms today Strength training completed today  Goals Unmet:  Not Applicable  Comments: Pt able to follow exercise prescription today without complaint.  Will continue to monitor for progression.

## 2023-07-28 LAB — BASIC METABOLIC PANEL
BUN/Creatinine Ratio: 21 (ref 12–28)
BUN: 17 mg/dL (ref 8–27)
CO2: 24 mmol/L (ref 20–29)
Calcium: 9.2 mg/dL (ref 8.7–10.3)
Chloride: 103 mmol/L (ref 96–106)
Creatinine, Ser: 0.81 mg/dL (ref 0.57–1.00)
Glucose: 89 mg/dL (ref 70–99)
Potassium: 4.6 mmol/L (ref 3.5–5.2)
Sodium: 141 mmol/L (ref 134–144)
eGFR: 81 mL/min/{1.73_m2} (ref >59)

## 2023-07-28 LAB — HEPATIC FUNCTION PANEL
ALT: 14 [IU]/L (ref 0–32)
AST: 22 [IU]/L (ref 0–40)
Albumin: 4.4 g/dL (ref 3.9–4.9)
Alkaline Phosphatase: 70 [IU]/L (ref 44–121)
Bilirubin Total: 0.7 mg/dL (ref 0.0–1.2)
Bilirubin, Direct: 0.22 mg/dL (ref 0.00–0.40)
Total Protein: 6.7 g/dL (ref 6.0–8.5)

## 2023-07-28 LAB — LIPID PANEL
Chol/HDL Ratio: 2.7 {ratio} (ref 0.0–4.4)
Cholesterol, Total: 145 mg/dL (ref 100–199)
HDL: 53 mg/dL (ref >39)
LDL Chol Calc (NIH): 71 mg/dL (ref 0–99)
Triglycerides: 117 mg/dL (ref 0–149)
VLDL Cholesterol Cal: 21 mg/dL (ref 5–40)

## 2023-07-28 LAB — TSH+FREE T4
Free T4: 1.66 ng/dL (ref 0.82–1.77)
TSH: 0.417 u[IU]/mL — ABNORMAL LOW (ref 0.450–4.500)

## 2023-07-29 ENCOUNTER — Encounter (HOSPITAL_COMMUNITY)
Admission: RE | Admit: 2023-07-29 | Discharge: 2023-07-29 | Disposition: A | Discharge status: Home / Self Care | Payer: Medicare Other | Source: Ambulatory Visit | Attending: Pulmonary Disease | Admitting: Pulmonary Disease

## 2023-07-29 ENCOUNTER — Encounter (HOSPITAL_COMMUNITY): Payer: Self-pay | Admitting: *Deleted

## 2023-07-29 DIAGNOSIS — J4489 Other specified chronic obstructive pulmonary disease: Secondary | ICD-10-CM

## 2023-07-29 NOTE — Progress Notes (Signed)
Daily Session Note  Patient Details  Name: [name] MRN: [id number] Date of Birth: 1961-01-20 Referring Provider:   Flowsheet Row PULMONARY REHAB COPD ORIENTATION from 06/04/2023 in [facility] CARDIAC REHABILITATION  Referring Provider Dr. Vassie Loll       Encounter Date: 07/29/2023  Check In:  Session Check In - 07/29/23 0745       Check-In   Supervising physician immediately available to respond to emergencies See telemetry face sheet for immediately available MD    Location [facility]    Staff Present Ross Ludwig, BS, Exercise Physiologist;Other    Virtual Visit No    Medication changes reported     No    Fall or balance concerns reported    No    Tobacco Cessation No Change    Warm-up and Cool-down Performed on first and last piece of equipment    Resistance Training Performed Yes    VAD Patient? No    PAD/SET Patient? No      Pain Assessment   Currently in Pain? No/denies    Multiple Pain Sites No             Capillary Blood Glucose: No results found for this or any previous visit (from the past 24 hour(s)).    Social History   Tobacco Use  Smoking Status Former   Current packs/day: 0.00   Average packs/day: 3.0 packs/day for 33.0 years (99.0 ttl pk-yrs)   Types: Cigarettes   Start date: 10/27/1972   Quit date: 2007   Years since quitting: 17.8  Smokeless Tobacco Never    Goals Met:  Independence with exercise equipment Using PLB without cueing & demonstrates good technique No report of concerns or symptoms today Strength training completed today  Goals Unmet:  Not Applicable  Comments: Pt able to follow exercise prescription today without complaint.  Will continue to monitor for progression.

## 2023-07-29 NOTE — Progress Notes (Signed)
Pulmonary Individual Treatment Plan  Patient Details  Name: [name] MRN: [id number] Date of Birth: 1960-11-04 Referring Provider:   Flowsheet Row PULMONARY REHAB COPD ORIENTATION from 06/04/2023 in [facility] CARDIAC REHABILITATION  Referring Provider Dr. Vassie Loll       Initial Encounter Date:  Flowsheet Row PULMONARY REHAB COPD ORIENTATION from 06/04/2023 in [facility] CARDIAC REHABILITATION  Date 06/04/23       Visit Diagnosis: COPD with chronic bronchitis (HCC)  Patient's Home Medications on Admission:   Current Outpatient Medications:    albuterol (PROVENTIL) (2.5 MG/3ML) 0.083% nebulizer solution, USE 1 VIAL IN NEBULIZER EVERY 4 HOURS AS NEEDED FOR SHORTNESS OF BREATH, Disp: 360 mL, Rfl: 0   albuterol (VENTOLIN HFA) 108 (90 Base) MCG/ACT inhaler, Inhale 2 puffs into the lungs every 4 (four) hours as needed for wheezing or shortness of breath., Disp: 8 g, Rfl: 5   aspirin 81 MG EC tablet, Take 81 mg by mouth at bedtime., Disp: , Rfl:    Cholecalciferol (VITAMIN D3) 400 units CAPS, Take 400 Units by mouth at bedtime. , Disp: , Rfl:    diphenhydrAMINE (BENADRYL) 25 mg capsule, Take 25 mg by mouth at bedtime., Disp: , Rfl:    Fluticasone-Umeclidin-Vilant (TRELEGY ELLIPTA) 100-62.5-25 MCG/ACT AEPB, Inhale 1 puff into the lungs daily., Disp: 1 each, Rfl: 11   levothyroxine (SYNTHROID) 125 MCG tablet, 1 qam all days except Mondays  use 1/2 in the am., Disp: 90 tablet, Rfl: 1   metoprolol succinate (TOPROL-XL) 25 MG 24 hr tablet, 1/2 qd, Disp: 90 tablet, Rfl: 3   Multiple Vitamin (MULTIVITAMIN) tablet, Take 1 tablet by mouth daily., Disp: , Rfl:    nitroGLYCERIN (NITROSTAT) 0.4 MG SL tablet, Place 1 tablet (0.4 mg total) under the tongue every 5 (five) minutes x 3 doses as needed for chest pain (if no relief after 3rd dose, proceed to ED or call 911)., Disp: 25 tablet, Rfl: 3   pantoprazole (PROTONIX) 40 MG tablet, Take 1 tablet (40 mg total) by mouth daily., Disp: 90 tablet, Rfl:  1   rosuvastatin (CRESTOR) 40 MG tablet, Take 1 tablet (40 mg total) by mouth daily., Disp: 90 tablet, Rfl: 1   vitamin C (ASCORBIC ACID) 500 MG tablet, Take 500 mg by mouth daily., Disp: , Rfl:    vitamin E 180 MG (400 UNITS) capsule, Take 400 Units by mouth daily., Disp: , Rfl:  No current facility-administered medications for this visit.  Facility-Administered Medications Ordered in Other Visits:    [facility] dose#1 phenylephrine 1%/ketorolac 0.3% (OMIDRIA) in BSS 500 ML (MUST DILUTE PRIOR TO USE) Optime, , , PRN, Pecolia Ades, MD, 500 mL at 11/14/22 1054  Past Medical History: Past Medical History:  Diagnosis Date   COPD (chronic obstructive pulmonary disease) (HCC)    Coronary atherosclerosis of native coronary artery    DES RCA 5/09, nonobstructive left system   Essential hypertension    Hypothyroidism    Mixed hyperlipidemia    NSTEMI (non-ST elevated myocardial infarction) (HCC)    2009    Tobacco Use: Social History   Tobacco Use  Smoking Status Former   Current packs/day: 0.00   Average packs/day: 3.0 packs/day for 33.0 years (99.0 ttl pk-yrs)   Types: Cigarettes   Start date: 10/27/1972   Quit date: 2007   Years since quitting: 17.8  Smokeless Tobacco Never    Labs: Review Flowsheet  More data exists      Latest Ref Rng & Units 07/29/2021 01/01/2022 07/03/2022 01/19/2023 07/27/2023  Labs for ITP Cardiac and Pulmonary Rehab  Cholestrol 100 - 199 mg/dL 161  096  045  409  811   LDL (calc) 0 - 99 mg/dL 94  70  90  83  71   HDL-C >39 mg/dL 49  57  52  53  53   Trlycerides 0 - 149 mg/dL 914  782  956  213  086     Details            Capillary Blood Glucose: Lab Results  Component Value Date   GLUCAP 97 06/21/2018     Pulmonary Assessment Scores:  Pulmonary Assessment Scores     Row Name 06/08/23 0811         ADL UCSD   ADL Phase Entry     SOB Score total 54     Rest 0     Walk 3     Stairs 3     Bath 1     Dress 1     Shop 3       CAT  Score   CAT Score 18       mMRC Score   mMRC Score 3             UCSD: Self-administered rating of dyspnea associated with activities of daily living (ADLs) 6-point scale (0 = "not at all" to 5 = "maximal or unable to do because of breathlessness")  Scoring Scores range from 0 to 120.  Minimally important difference is 5 units  CAT: CAT can identify the health impairment of COPD patients and is better correlated with disease progression.  CAT has a scoring range of zero to 40. The CAT score is classified into four groups of low (less than 10), medium (10 - 20), high (21-30) and very high (31-40) based on the impact level of disease on health status. A CAT score over 10 suggests significant symptoms.  A worsening CAT score could be explained by an exacerbation, poor medication adherence, poor inhaler technique, or progression of COPD or comorbid conditions.  CAT MCID is 2 points  mMRC: mMRC (Modified Medical Research Council) Dyspnea Scale is used to assess the degree of baseline functional disability in patients of respiratory disease due to dyspnea. No minimal important difference is established. A decrease in score of 1 point or greater is considered a positive change.   Pulmonary Function Assessment:   Exercise Target Goals: Exercise Program Goal: Individual exercise prescription set using results from initial 6 min walk test and THRR while considering  patient's activity barriers and safety.   Exercise Prescription Goal: Initial exercise prescription builds to 30-45 minutes a day of aerobic activity, 2-3 days per week.  Home exercise guidelines will be given to patient during program as part of exercise prescription that the participant will acknowledge.  Activity Barriers & Risk Stratification:  Activity Barriers & Cardiac Risk Stratification - 05/29/23 0907       Activity Barriers & Cardiac Risk Stratification   Activity Barriers Shortness of Breath;Deconditioning     Cardiac Risk Stratification Moderate             6 Minute Walk:  6 Minute Walk     Row Name 06/04/23 1003         6 Minute Walk   Phase Initial     Distance 1060 feet     Walk Time 6 minutes     # of Rest Breaks 0     MPH 2.01  METS 2.66     RPE 11     Perceived Dyspnea  1     VO2 Peak 9.31     Symptoms No     Resting HR 67 bpm     Resting BP 110/60     Resting Oxygen Saturation  94 %     Exercise Oxygen Saturation  during 6 min walk 85 %     Max Ex. HR 90 bpm     Max Ex. BP 132/60     2 Minute Post BP 120/60       Interval HR   1 Minute HR 106     2 Minute HR 93     3 Minute HR 88     4 Minute HR 90     5 Minute HR 88     6 Minute HR 89     2 Minute Post HR 75     Interval Heart Rate? Yes       Interval Oxygen   Interval Oxygen? Yes     Baseline Oxygen Saturation % 94 %     1 Minute Oxygen Saturation % 88 %     1 Minute Liters of Oxygen 0 L     2 Minute Oxygen Saturation % 86 %     2 Minute Liters of Oxygen 0 L     3 Minute Oxygen Saturation % 86 %     3 Minute Liters of Oxygen 0 L     4 Minute Oxygen Saturation % 85 %     4 Minute Liters of Oxygen 0 L     5 Minute Oxygen Saturation % 86 %     5 Minute Liters of Oxygen 0 L     6 Minute Oxygen Saturation % 87 %     6 Minute Liters of Oxygen 0 L     2 Minute Post Oxygen Saturation % 94 %     2 Minute Post Liters of Oxygen 0 L              Oxygen Initial Assessment:   Oxygen Re-Evaluation:  Oxygen Re-Evaluation     Row Name 06/08/23 0802 06/15/23 0819 07/06/23 0813         Program Oxygen Prescription   Program Oxygen Prescription -- None None       Home Oxygen   Home Oxygen Device -- None None     Sleep Oxygen Prescription -- None None     Home Exercise Oxygen Prescription -- None None     Home Resting Oxygen Prescription -- None None       Goals/Expected Outcomes   Short Term Goals -- -- To learn and understand importance of monitoring SPO2 with pulse oximeter and demonstrate  accurate use of the pulse oximeter.;To learn and understand importance of maintaining oxygen saturations>88%;To learn and demonstrate proper pursed lip breathing techniques or other breathing techniques. ;To learn and demonstrate proper use of respiratory medications     Long  Term Goals -- -- Verbalizes importance of monitoring SPO2 with pulse oximeter and return demonstration;Maintenance of O2 saturations>88%;Compliance with respiratory medication;Exhibits proper breathing techniques, such as pursed lip breathing or other method taught during program session     Comments Reviewed PLB technique with pt.  Talked about how it works and it's importance in maintaining their exercise saturations. Inari is doing well in the program so far. Her breathing is staying WNL when exrcising 88-90s. She feels her breathing improving each week  and her energy increaing as well. She does breath through her mouth and is trying PLB. Guillermo is doing well in rehab.  Her breathing is doing better and she is feelng better overall.  Her sats conitnue to drop some with exercise but she does not feel too winded. She is good about using her PLB to get it to recover.  Her family has also noticed a difference in her breathing too.     Goals/Expected Outcomes Short: Become more profiecient at using PLB. Long: Become independent at using PLB. Short : continue to foucse on PLB when exercign    long: continue to monitor breathing and oxygen stats when exercising Short: Conitnue to use to help with PLB.  Long: Continue to monitor saturations closely.              Oxygen Discharge (Final Oxygen Re-Evaluation):  Oxygen Re-Evaluation - 07/06/23 0813       Program Oxygen Prescription   Program Oxygen Prescription None      Home Oxygen   Home Oxygen Device None    Sleep Oxygen Prescription None    Home Exercise Oxygen Prescription None    Home Resting Oxygen Prescription None      Goals/Expected Outcomes   Short Term Goals To  learn and understand importance of monitoring SPO2 with pulse oximeter and demonstrate accurate use of the pulse oximeter.;To learn and understand importance of maintaining oxygen saturations>88%;To learn and demonstrate proper pursed lip breathing techniques or other breathing techniques. ;To learn and demonstrate proper use of respiratory medications    Long  Term Goals Verbalizes importance of monitoring SPO2 with pulse oximeter and return demonstration;Maintenance of O2 saturations>88%;Compliance with respiratory medication;Exhibits proper breathing techniques, such as pursed lip breathing or other method taught during program session    Comments Wyatt is doing well in rehab.  Her breathing is doing better and she is feelng better overall.  Her sats conitnue to drop some with exercise but she does not feel too winded. She is good about using her PLB to get it to recover.  Her family has also noticed a difference in her breathing too.    Goals/Expected Outcomes Short: Conitnue to use to help with PLB.  Long: Continue to monitor saturations closely.             Initial Exercise Prescription:  Initial Exercise Prescription - 06/04/23 1000       Date of Initial Exercise RX and Referring Provider   Date 06/04/23    Referring Provider Dr. Vassie Loll      Oxygen   Maintain Oxygen Saturation 88% or higher      Treadmill   MPH 2    Grade 0    Minutes 15      Recumbant Elliptical   Level 1    RPM 60    Minutes 15      Prescription Details   Frequency (times per week) 2    Duration Progress to 30 minutes of continuous aerobic without signs/symptoms of physical distress      Intensity   THRR 40-80% of Max Heartrate 103-138    Ratings of Perceived Exertion 11-13    Perceived Dyspnea 0-4      Resistance Training   Training Prescription Yes    Weight 3    Reps 10-15             Perform Capillary Blood Glucose checks as needed.  Exercise Prescription Changes:   Exercise  Prescription Changes  Row Name 06/15/23 1300 07/01/23 1300 07/06/23 0800 07/13/23 1200 07/27/23 1200     Response to Exercise   Blood Pressure (Admit) 118/70 122/68 -- 124/64 118/60   Blood Pressure (Exercise) 126/70 132/70 -- -- --   Blood Pressure (Exit) 100/70 112/64 -- 110/60 110/60   Heart Rate (Admit) 73 bpm 70 bpm -- 71 bpm 67 bpm   Heart Rate (Exercise) 85 bpm 93 bpm -- 90 bpm 90 bpm   Heart Rate (Exit) 75 bpm 81 bpm -- 74 bpm 79 bpm   Oxygen Saturation (Admit) 95 % 93 % -- 91 % 92 %   Oxygen Saturation (Exercise) 88 % 90 % -- 88 % 88 %   Oxygen Saturation (Exit) 94 % 91 % -- 94 % 94 %   Rating of Perceived Exertion (Exercise) 12 12 -- 12 12   Perceived Dyspnea (Exercise) 1 1 -- 2 2   Duration Continue with 30 min of aerobic exercise without signs/symptoms of physical distress. Continue with 30 min of aerobic exercise without signs/symptoms of physical distress. -- Continue with 30 min of aerobic exercise without signs/symptoms of physical distress. Continue with 30 min of aerobic exercise without signs/symptoms of physical distress.   Intensity THRR unchanged THRR unchanged -- THRR unchanged THRR unchanged     Progression   Progression Continue to progress workloads to maintain intensity without signs/symptoms of physical distress. Continue to progress workloads to maintain intensity without signs/symptoms of physical distress. -- Continue to progress workloads to maintain intensity without signs/symptoms of physical distress. Continue to progress workloads to maintain intensity without signs/symptoms of physical distress.     Resistance Training   Training Prescription Yes Yes -- Yes Yes   Weight 3 lbs 3 lbs -- 3 lbs / blue band 3 lbs / blue band   Reps 10-15 10-15 -- 10-15 10-15   Time 1 Minutes -- -- -- --     Treadmill   MPH 2 2.2 -- 2 1.7   Grade 0 0.5 -- 0.5 0   Minutes 15 15 -- 15 15   METs 2.53 2.84 -- 2.67 2.3     Recumbant Elliptical   Level 3 3 -- 3 3   RPM  47 38 -- 45 47   Minutes 15 15 -- 15 15   METs 2.3 2.5 -- 3.1 3.1     Home Exercise Plan   Plans to continue exercise at -- -- Home (comment)  walking, [employer] Home (comment) Home (comment)   Frequency -- -- Add 2 additional days to program exercise sessions. Add 2 additional days to program exercise sessions. Add 2 additional days to program exercise sessions.   Initial Home Exercises Provided -- -- 07/06/23 -- --     Oxygen   Maintain Oxygen Saturation 88% or higher 88% or higher -- 88% or higher 88% or higher            Exercise Comments:   Exercise Comments     Row Name 06/17/23 0814           Exercise Comments Reviewed home exercise                Exercise Goals and Review:   Exercise Goals     Row Name 06/04/23 1005             Exercise Goals   Increase Physical Activity Yes       Intervention Provide advice, education, support and counseling about physical activity/exercise needs.;Develop an individualized  exercise prescription for aerobic and resistive training based on initial evaluation findings, risk stratification, comorbidities and participant's personal goals.       Expected Outcomes Short Term: Attend rehab on a regular basis to increase amount of physical activity.;Long Term: Add in home exercise to make exercise part of routine and to increase amount of physical activity.;Long Term: Exercising regularly at least 3-5 days a week.       Increase Strength and Stamina Yes       Intervention Provide advice, education, support and counseling about physical activity/exercise needs.;Develop an individualized exercise prescription for aerobic and resistive training based on initial evaluation findings, risk stratification, comorbidities and participant's personal goals.       Expected Outcomes Short Term: Increase workloads from initial exercise prescription for resistance, speed, and METs.;Short Term: Perform resistance training exercises routinely  during rehab and add in resistance training at home;Long Term: Improve cardiorespiratory fitness, muscular endurance and strength as measured by increased METs and functional capacity ( )       Able to understand and use rate of perceived exertion (RPE) scale Yes       Intervention Provide education and explanation on how to use RPE scale       Expected Outcomes Short Term: Able to use RPE daily in rehab to express subjective intensity level;Long Term:  Able to use RPE to guide intensity level when exercising independently       Able to understand and use Dyspnea scale Yes       Intervention Provide education and explanation on how to use Dyspnea scale       Expected Outcomes Short Term: Able to use Dyspnea scale daily in rehab to express subjective sense of shortness of breath during exertion;Long Term: Able to use Dyspnea scale to guide intensity level when exercising independently       Knowledge and understanding of Target Heart Rate Range (THRR) Yes       Intervention Provide education and explanation of THRR including how the numbers were predicted and where they are located for reference       Expected Outcomes Short Term: Able to use daily as guideline for intensity in rehab;Short Term: Able to state/look up THRR;Long Term: Able to use THRR to govern intensity when exercising independently       Able to check pulse independently Yes       Intervention Provide education and demonstration on how to check pulse in carotid and radial arteries.;Review the importance of being able to check your own pulse for safety during independent exercise       Expected Outcomes Short Term: Able to explain why pulse checking is important during independent exercise;Long Term: Able to check pulse independently and accurately       Understanding of Exercise Prescription Yes       Intervention Provide education, explanation, and written materials on patient's individual exercise prescription       Expected  Outcomes Short Term: Able to explain program exercise prescription;Long Term: Able to explain home exercise prescription to exercise independently                Exercise Goals Re-Evaluation :  Exercise Goals Re-Evaluation     Row Name 06/08/23 0800 06/15/23 0757 06/15/23 1332 07/01/23 1312 07/06/23 0759     Exercise Goal Re-Evaluation   Exercise Goals Review Increase Physical Activity;Increase Strength and Stamina;Able to understand and use Dyspnea scale;Understanding of Exercise Prescription Increase Physical Activity;Increase Strength and Stamina;Understanding of Exercise Prescription  Increase Physical Activity;Increase Strength and Stamina;Understanding of Exercise Prescription Increase Physical Activity;Increase Strength and Stamina;Understanding of Exercise Prescription Increase Physical Activity;Increase Strength and Stamina;Understanding of Exercise Prescription   Comments Reviewed RPE  and dyspnea scale, THR and program prescription with pt today.  Pt voiced understanding and was given a copy of goals to take home. Yvonnie has just started the program in the past week. She has been doing great in the program so far. She continues to walk everyday. She stated that she feels better and has more energy. She enjoys coming to rehab and aslo likes the social aspect. Annel has tolerated exercise well. She is increaing her level on the XR to level 3. She is also walking everyday. Will continue to monitor and progress as able. Priyah has been tolerating exericse well, She has inceased her speed and incline on the treadmill in the last week to 2.2/0.5. Will continue to monitor and progress as able. Meriem is doing well in rehab.  She is starting to improve her stamina.  Her breathing is starting to improve.  Reviewed home exercise with pt today.  Pt plans to walk and use Cardioglide at home for exercise.  She also has a exercise bike in her basement.  Reviewed THR, pulse, RPE, sign and symptoms,  pulse oximetery and when to call 911 or MD.  Also discussed weather considerations and indoor options.  Pt voiced understanding.   Expected Outcomes Short: Use RPE daily to regulate intensity. Long: Follow program prescription in THR. Short term: go over home exercise   long term: continue to attend rehab Short: continue to exercise at RPE of 12 then increase workload    Long term: continue to attend rehab Short term: continue to exerise at workload and increase when ready   long term: continue to attend rehab Short: start to add in more exercise at home. Long: conintue to improve stamina.    Row Name 07/28/23 0737             Exercise Goal Re-Evaluation   Exercise Goals Review Increase Physical Activity;Able to understand and use rate of perceived exertion (RPE) scale;Understanding of Exercise Prescription       Comments Demecia is doing well in rehab. She has increased her level on the XR to level 3 and is pushing herself. She is focusing on her oxygen and PLB. She is currently exercising at 3.1 METs. Will continue to monitor and progress as able.       Expected Outcomes Short: increase walking speed on treadmill   long: continue to attend rehab                Discharge Exercise Prescription (Final Exercise Prescription Changes):  Exercise Prescription Changes - 07/27/23 1200       Response to Exercise   Blood Pressure (Admit) 118/60    Blood Pressure (Exit) 110/60    Heart Rate (Admit) 67 bpm    Heart Rate (Exercise) 90 bpm    Heart Rate (Exit) 79 bpm    Oxygen Saturation (Admit) 92 %    Oxygen Saturation (Exercise) 88 %    Oxygen Saturation (Exit) 94 %    Rating of Perceived Exertion (Exercise) 12    Perceived Dyspnea (Exercise) 2    Duration Continue with 30 min of aerobic exercise without signs/symptoms of physical distress.    Intensity THRR unchanged      Progression   Progression Continue to progress workloads to maintain intensity without signs/symptoms of physical  distress.  Resistance Training   Training Prescription Yes    Weight 3 lbs / blue band    Reps 10-15      Treadmill   MPH 1.7    Grade 0    Minutes 15    METs 2.3      Recumbant Elliptical   Level 3    RPM 47    Minutes 15    METs 3.1      Home Exercise Plan   Plans to continue exercise at Home (comment)    Frequency Add 2 additional days to program exercise sessions.      Oxygen   Maintain Oxygen Saturation 88% or higher             Nutrition:  Target Goals: Understanding of nutrition guidelines, daily intake of sodium 1500mg , cholesterol 200mg , calories 30% from fat and 7% or less from saturated fats, daily to have 5 or more servings of fruits and vegetables.  Biometrics:  Pre Biometrics - 06/04/23 1006       Pre Biometrics   Height 5' 1.5" (1.562 m)    Weight 142 lb 10.2 oz (64.7 kg)    Waist Circumference 33.5 inches    Hip Circumference 38 inches    Waist to Hip Ratio 0.88 %    BMI (Calculated) 26.52    Grip Strength 25.3 kg              Nutrition Therapy Plan and Nutrition Goals:   Nutrition Assessments:  MEDIFICTS Score Key: >=70 Need to make dietary changes  40-70 Heart Healthy Diet <= 40 Therapeutic Level Cholesterol Diet  Flowsheet Row PULMONARY REHAB CHRONIC OBSTRUCTIVE PULMONARY DISEASE from 06/08/2023 in [facility]ARDIAC REH[facility]IL[facility]  Picture Your Plate Total Score on Admission 13      Picture Your Plate Scores: <16 Unhealthy dietary pattern with much room for improvement. 41-50 Dietary pattern unlikely to meet recommendations for good health and room for improvement. 51-60 More healthful dietary pattern, with some room for improvement.  >60 Healthy dietary pattern, although there may be some specific behaviors that could be improved.    Nutrition Goals Re-Evaluation:  Nutrition Goals Re-Evaluation     Row Name 06/15/23 [phone] 07/06/23 0808           Goals   Nutrition Goal Healthy eating Short term: continue to  cut down on chips long term: continue to eat healthy      Comment Bobbette has been working on her healthy eating. She has started to cook veggie and has learned she likes peppers. She eats more chicken within the week and it is normally baked or grilled. She stated that she does not eat a lot of sweets. She is happy with her progress in healthy eating. Dawsyn has been working on her diet.  She has been staying away from chips and sweets.  It is starting to pay off as her weight is starting to come down.  She is getting lots of fruits and vegetables.  She is pleased with weight coming off.  She says she is feeling better.  She is staying away from soda and milk and sticking to green tea and water.  She says she feels better overall with her diet now.      Expected Outcome Short term: continue to cut down on chips   long term: continue to eat healthy Short: Conitnue to stay away from bad foods and splurge on occassion Long; conitnue to eat healthy  Nutrition Goals Discharge (Final Nutrition Goals Re-Evaluation):  Nutrition Goals Re-Evaluation - 07/06/23 0808       Goals   Nutrition Goal Short term: continue to cut down on chips long term: continue to eat healthy    Comment Eliane has been working on her diet.  She has been staying away from chips and sweets.  It is starting to pay off as her weight is starting to come down.  She is getting lots of fruits and vegetables.  She is pleased with weight coming off.  She says she is feeling better.  She is staying away from soda and milk and sticking to green tea and water.  She says she feels better overall with her diet now.    Expected Outcome Short: Conitnue to stay away from bad foods and splurge on occassion Long; conitnue to eat healthy             Psychosocial: Target Goals: Acknowledge presence or absence of significant depression and/or stress, maximize coping skills, provide positive support system. Participant is able to  verbalize types and ability to use techniques and skills needed for reducing stress and depression.  Initial Review & Psychosocial Screening:  Initial Psych Review & Screening - 05/29/23 0936       Initial Review   Current issues with Current Sleep Concerns      Family Dynamics   Good Support System? Yes      Barriers   Psychosocial barriers to participate in program There are no identifiable barriers or psychosocial needs.      Screening Interventions   Interventions Encouraged to exercise;To provide support and resources with identified psychosocial needs;Provide feedback about the scores to participant    Expected Outcomes Short Term goal: Utilizing psychosocial counselor, staff and physician to assist with identification of specific Stressors or current issues interfering with healing process. Setting desired goal for each stressor or current issue identified.;Long Term Goal: Stressors or current issues are controlled or eliminated.;Short Term goal: Identification and review with participant of any Quality of Life or Depression concerns found by scoring the questionnaire.;Long Term goal: The participant improves quality of Life and PHQ9 Scores as seen by post scores and/or verbalization of changes             Quality of Life Scores:  Scores of 19 and below usually indicate a poorer quality of life in these areas.  A difference of  2-3 points is a clinically meaningful difference.  A difference of 2-3 points in the total score of the Quality of Life Index has been associated with significant improvement in overall quality of life, self-image, physical symptoms, and general health in studies assessing change in quality of life.   PHQ-9: Review Flowsheet  More data exists      06/04/2023 03/05/2023 02/12/2023 01/23/2023 01/21/2023  Depression screen PHQ 2/9  Decreased Interest 0 0 0 0 0  Down, Depressed, Hopeless 0 0 0 0 0  PHQ - 2 Score 0 0 0 0 0  Altered sleeping 1 0 - - -  Tired,  decreased energy 0 1 - - -  Change in appetite 0 0 - - -  Feeling bad or failure about yourself  0 0 - - -  Trouble concentrating 0 0 - - -  Moving slowly or fidgety/restless 0 0 - - -  Suicidal thoughts 0 0 - - -  PHQ-9 Score 1 1 - - -  Difficult doing work/chores Not difficult at all Somewhat difficult - - -  Details           Interpretation of Total Score  Total Score Depression Severity:  1-4 = Minimal depression, 5-9 = Mild depression, 10-14 = Moderate depression, 15-19 = Moderately severe depression, 20-27 = Severe depression   Psychosocial Evaluation and Intervention:  Psychosocial Evaluation - 05/29/23 0936       Psychosocial Evaluation & Interventions   Interventions Stress management education;Relaxation education;Encouraged to exercise with the program and follow exercise prescription    Comments 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 She lives with her husband of 42 years and they have a son and 2 grandchildren. Her husband and grandchildren are her main support people. She tries to live a very active life but has not been able to do as much as she would like recently due to her advancing COPD. She does walk everyday 30 mins. She denies any depression, anxiety or stress. She does have trouble staying asleep some nights. She recently started taking benadryl and was taking the generic brand from [facility]. She did not realize she was taking benadryl and ask if this was safe.  I advised her to speak with her pcp about her concerns about taking benadryl. She has tried melotonin in the past and it did not help. She said she had been reluctant to participate in the program for a long time eventhough Dr. Vassie Loll had recommended it but she saw a video on you-tube about pulmonary rehabilitation and its benefits and decided to give it a try. She does have a $15 dollar copayment and she was not sure if this was going to be a barrier but said she wanted to do as much as she  could of the program. No other barriers identified. Her main goals for the program are to get her lungs stronger and be able to do all the activities she wants to do. She wants to be able to help her husband mow the yard. She said she was not able to go out onto the beach with her grandchildren this summer for the first time since they were born, 14 years, due to her SOB and this really brothered her. She would like to get her lungs strong enough to participate in these activities.    Expected Outcomes Short Term: Start and attend the program consistently. Long Term: Get her lung stronger and be able to do all the activities she wants to do.    Continue Psychosocial Services  Follow up required by staff             Psychosocial Re-Evaluation:  Psychosocial Re-Evaluation     Row Name 06/15/23 0806 07/06/23 0811           Psychosocial Re-Evaluation   Current issues with Current Sleep Concerns Current Sleep Concerns      Comments Ellenie said that she was taking an sleep aid she got from [employer] and when talking to her PCP they noticed it was a form of benadryl. She has started to not take anything for sleep. She said she will go to sleep pretty fast but will wake up within 2 hours and her mind will start racing. SHe has no other barriors Cris is doing well in rehab.  She had a hard time sleeping last night. She has a hard time calming her mind and staying awake from husband keeping her awake with his snoring.  Otherwise, she is feeling good mentally and doing well.  She has gotten used to the lack  of sleep and adjust each day.  She is generally a positive person and enjoys coming to exercise.      Expected Outcomes Shor term: continue to work on sleep    Long term: continue to have no barriors Short: Continue to work on sleep Long: conitnue to stay positive.      Interventions Stress management education;Relaxation education;Encouraged to attend Pulmonary Rehabilitation for the exercise Stress  management education;Relaxation education;Encouraged to attend Pulmonary Rehabilitation for the exercise      Continue Psychosocial Services  Follow up required by staff Follow up required by staff               Psychosocial Discharge (Final Psychosocial Re-Evaluation):  Psychosocial Re-Evaluation - 07/06/23 0811       Psychosocial Re-Evaluation   Current issues with Current Sleep Concerns    Comments Verl is doing well in rehab.  She had a hard time sleeping last night. She has a hard time calming her mind and staying awake from husband keeping her awake with his snoring.  Otherwise, she is feeling good mentally and doing well.  She has gotten used to the lack of sleep and adjust each day.  She is generally a positive person and enjoys coming to exercise.    Expected Outcomes Short: Continue to work on sleep Long: conitnue to stay positive.    Interventions Stress management education;Relaxation education;Encouraged to attend Pulmonary Rehabilitation for the exercise    Continue Psychosocial Services  Follow up required by staff              Education: Education Goals: Education classes will be provided on a weekly basis, covering required topics. Participant will state understanding/return demonstration of topics presented.  Learning Barriers/Preferences:  Learning Barriers/Preferences - 05/29/23 0935       Learning Barriers/Preferences   Learning Barriers None    Learning Preferences Audio;Written Material             Education Topics: How Lungs Work and Diseases: - Discuss the anatomy of the lungs and diseases that can affect the lungs, such as COPD.   Exercise: -Discuss the importance of exercise, FITT principles of exercise, normal and abnormal responses to exercise, and how to exercise safely.   Environmental Irritants: -Discuss types of environmental irritants and how to limit exposure to environmental irritants.   Meds/Inhalers and oxygen: -  Discuss respiratory medications, definition of an inhaler and oxygen, and the proper way to use an inhaler and oxygen.   Energy Saving Techniques: - Discuss methods to conserve energy and decrease shortness of breath when performing activities of daily living.    Bronchial Hygiene / Breathing Techniques: - Discuss breathing mechanics, pursed-lip breathing technique,  proper posture, effective ways to clear airways, and other functional breathing techniques   Cleaning Equipment: - Provides group verbal and written instruction about the health risks of elevated stress, cause of high stress, and healthy ways to reduce stress.   Nutrition I: Fats: - Discuss the types of cholesterol, what cholesterol does to the body, and how cholesterol levels can be controlled. Flowsheet Row PULMONARY REHAB CHRONIC OBSTRUCTIVE PULMONARY DISEASE from 07/22/2023 in [facility] [facility]  Date 07/08/23  Educator [facility]  Instruction Review Code 1- Verbalizes Understanding       Nutrition II: Labels: -Discuss the different components of food labels and how to read food labels. Flowsheet Row PULMONARY REHAB CHRONIC OBSTRUCTIVE PULMONARY DISEASE from 07/22/2023 in [place] [facility]  Date 07/08/23  Educator [facility]  Instruction Review Code 1- Verbalizes Understanding       Respiratory Infections: - Discuss the signs and symptoms of respiratory infections, ways to prevent respiratory infections, and the importance of seeking medical treatment when having a respiratory infection.   Stress I: Signs and Symptoms: - Discuss the causes of stress, how stress may lead to anxiety and depression, and ways to limit stress. Flowsheet Row PULMONARY REHAB CHRONIC OBSTRUCTIVE PULMONARY DISEASE from 07/22/2023 in [place] [facility]  Date 07/15/23  Educator [facility]  Instruction Review Code 1- Verbalizes Understanding       Stress II: Relaxation: -Discuss relaxation techniques to  limit stress. Flowsheet Row PULMONARY REHAB CHRONIC OBSTRUCTIVE PULMONARY DISEASE from 07/22/2023 in [place] [facility]  Date 07/15/23  Educator [facility]  Instruction Review Code 1- Verbalizes Understanding       Oxygen for Home/Travel: - Discuss how to prepare for travel when on oxygen and proper ways to transport and store oxygen to ensure safety.   Knowledge Questionnaire Score:  Knowledge Questionnaire Score - 06/08/23 0812       Knowledge Questionnaire Score   Pre Score 15/18             Core Components/Risk Factors/Patient Goals at Admission:  Personal Goals and Risk Factors at Admission - 05/29/23 0935       Core Components/Risk Factors/Patient Goals on Admission    Weight Management Weight Maintenance    Improve shortness of breath with ADL's Yes    Intervention Provide education, individualized exercise plan and daily activity instruction to help decrease symptoms of SOB with activities of daily living.    Expected Outcomes Short Term: Improve cardiorespiratory fitness to achieve a reduction of symptoms when performing ADLs;Long Term: Be able to perform more ADLs without symptoms or delay the onset of symptoms    Increase knowledge of respiratory medications and ability to use respiratory devices properly  Yes    Intervention Provide education and demonstration as needed of appropriate use of medications, inhalers, and oxygen therapy.    Expected Outcomes Short Term: Achieves understanding of medications use. Understands that oxygen is a medication prescribed by physician. Demonstrates appropriate use of inhaler and oxygen therapy.;Long Term: Maintain appropriate use of medications, inhalers, and oxygen therapy.    Hypertension Yes    Intervention Provide education on lifestyle modifcations including regular physical activity/exercise, weight management, moderate sodium restriction and increased consumption of fresh fruit, vegetables, and low fat dairy,  alcohol moderation, and smoking cessation.;Monitor prescription use compliance.    Expected Outcomes Short Term: Continued assessment and intervention until BP is < 140/26mm HG in hypertensive participants. < 130/69mm HG in hypertensive participants with diabetes, heart failure or chronic kidney disease.;Long Term: Maintenance of blood pressure at goal levels.    Lipids Yes    Intervention Provide education and support for participant on nutrition & aerobic/resistive exercise along with prescribed medications to achieve LDL 70mg , HDL >40mg .    Expected Outcomes Short Term: Participant states understanding of desired cholesterol values and is compliant with medications prescribed. Participant is following exercise prescription and nutrition guidelines.;Long Term: Cholesterol controlled with medications as prescribed, with individualized exercise RX and with personalized nutrition plan. Value goals: LDL < 70mg , HDL > 40 mg.             Core Components/Risk Factors/Patient Goals Review:   Goals and Risk Factor Review     Row Name 06/15/23 0822 07/06/23 0806           Core Components/Risk Factors/Patient  Goals Review   Personal Goals Review Improve shortness of breath with ADL's;Weight Management/Obesity Improve shortness of breath with ADL's;Weight Management/Obesity;Increase knowledge of respiratory medications and ability to use respiratory devices properly.      Review Berdine is doing good with exercise. She is working on [employer] with exercising by walking everyday and eating healthy. She stated that she wants to keep around the same weight she is and does not want to gain. She is also focusing on PLB to help improve her SOB. She has noticed she breaths through her mouth a lot and is trying to remeber PLB. She stated that she has felt better since exercising. Adilen is doing well in rehab.  She feels like her breathing is getting better overall.  She is able to do more with less  SOB.  She does still see her saturations dropping but they do recover with PLB.  She is doing well on her meds.  She was excited about her weight today.  She was under 140 lb today!!  She is doing better with her diet and it is finally starting to pay off.      Expected Outcomes Short term: comtinue to focuse of PLB and weight managment   long term: continue to enjoy exercising and walking at home Short: Conitnue to work on breathing Long: Conitnue to work on [employer] loss               Core Components/Risk Factors/Patient Goals at Discharge (Final Review):   Goals and Risk Factor Review - 07/06/23 0806       Core Components/Risk Factors/Patient Goals Review   Personal Goals Review Improve shortness of breath with ADL's;Weight Management/Obesity;Increase knowledge of respiratory medications and ability to use respiratory devices properly.    Review Golden is doing well in rehab.  She feels like her breathing is getting better overall.  She is able to do more with less SOB.  She does still see her saturations dropping but they do recover with PLB.  She is doing well on her meds.  She was excited about her weight today.  She was under 140 lb today!!  She is doing better with her diet and it is finally starting to pay off.    Expected Outcomes Short: Conitnue to work on breathing Long: Conitnue to work on weight loss             ITP Comments:  ITP Comments     Row Name 06/08/23 0759 07/01/23 0643 07/29/23 0647       ITP Comments First full day of exercise!  Patient was oriented to gym and equipment including functions, settings, policies, and procedures.  Patient's individual exercise prescription and treatment plan were reviewed.  All starting workloads were established based on the results of the 6 minute walk test done at initial orientation visit.  The plan for exercise progression was also introduced and progression will be customized based on patient's performance and goals 30 day  review completed. ITP sent to Dr.Jehanzeb Memon, Medical Director of  Pulmonary Rehab. Continue with ITP unless changes are made by physician. 30 day review completed. ITP sent to Dr.Jehanzeb Memon, Medical Director of  Pulmonary Rehab. Continue with ITP unless changes are made by physician.              Comments: 30 day review

## 2023-08-03 ENCOUNTER — Encounter (HOSPITAL_COMMUNITY)
Admission: RE | Admit: 2023-08-03 | Discharge: 2023-08-03 | Disposition: A | Discharge status: Home / Self Care | Payer: Medicare Other | Source: Ambulatory Visit | Attending: Pulmonary Disease

## 2023-08-03 DIAGNOSIS — J4489 Other specified chronic obstructive pulmonary disease: Secondary | ICD-10-CM

## 2023-08-03 NOTE — Progress Notes (Signed)
Daily Session Note  Patient Details  Name: [name] MRN: [id number] Date of Birth: [birth year] Referring Provider:   Flowsheet Row PULMONARY REHAB COPD ORIENTATION from 06/04/2023 in [facility]  Referring Provider Dr. Vassie Loll       Encounter Date: 08/03/2023  Check In:  Session Check In - 08/03/23 0754       Check-In   Supervising physician immediately available to respond to emergencies See telemetry face sheet for immediately available MD    Location [facility]    Staff Present Rodena Medin, RN, BSN;Corneilus Heggie Juanetta Gosling, MA, RCEP, CCRP, CCET    Virtual Visit No    Medication changes reported     No    Fall or balance concerns reported    No    Warm-up and Cool-down Performed on first and last piece of equipment    Resistance Training Performed Yes    VAD Patient? No    PAD/SET Patient? No      Pain Assessment   Currently in Pain? No/denies             Capillary Blood Glucose: No results found for this or any previous visit (from the past 24 hour(s)).    Social History   Tobacco Use  Smoking Status Former   Current packs/day: 0.00   Average packs/day: 3.0 packs/day for 33.0 years (99.0 ttl pk-yrs)   Types: Cigarettes   Start date: 10/27/1972   Quit date: 2007   Years since quitting: 17.8  Smokeless Tobacco Never    Goals Met:  Proper associated with RPD/PD & O2 Sat Independence with exercise equipment Using PLB without cueing & demonstrates good technique Exercise tolerated well No report of concerns or symptoms today Strength training completed today  Goals Unmet:  Not Applicable  Comments: Pt able to follow exercise prescription today without complaint.  Will continue to monitor for progression.

## 2023-08-04 ENCOUNTER — Other Ambulatory Visit: Payer: Self-pay | Admitting: Pharmacist

## 2023-08-04 NOTE — Progress Notes (Signed)
Pharmacy Quality Measure Review  This patient is appearing on a report for being at risk of failing the adherence measure for cholesterol (statin) medications this calendar year.   Medication: rosuvastatin 40 mg  Last fill date: 8/6 for 90 day supply  Called patient to discuss and she reports adherence to rosuvastatin 40 mg daily. Confirmed her current medication bottle says it was filled on 8/6 and she has about 10-15 tablets remaining. She reports that after picking up 40 mg tablets she realized she had some rosuvastatin tablets leftover from when she was on a lower dose so she completed what she had then started taking the 40 mg tablets. Reports she has an appt with Dr. Gerda Diss tomorrow to discuss her labs from last week and determine if she needs another medication for her cholesterol.  No further action needed at this time.  Jarrett Ables, PharmD PGY-1 Pharmacy Resident

## 2023-08-05 ENCOUNTER — Encounter (HOSPITAL_COMMUNITY)
Admission: RE | Admit: 2023-08-05 | Discharge: 2023-08-05 | Disposition: A | Discharge status: Home / Self Care | Payer: Medicare Other | Source: Ambulatory Visit | Attending: Pulmonary Disease | Admitting: Pulmonary Disease

## 2023-08-05 ENCOUNTER — Ambulatory Visit: Payer: Medicare Other | Admitting: Family Medicine

## 2023-08-05 VITALS — BP 98/64 | HR 71 | Ht 61.0 in | Wt 139.6 lb

## 2023-08-05 DIAGNOSIS — I10 Essential (primary) hypertension: Secondary | ICD-10-CM

## 2023-08-05 DIAGNOSIS — Z23 Encounter for immunization: Secondary | ICD-10-CM

## 2023-08-05 DIAGNOSIS — J4489 Other specified chronic obstructive pulmonary disease: Secondary | ICD-10-CM | POA: Diagnosis not present

## 2023-08-05 DIAGNOSIS — I700 Atherosclerosis of aorta: Secondary | ICD-10-CM

## 2023-08-05 DIAGNOSIS — E782 Mixed hyperlipidemia: Secondary | ICD-10-CM | POA: Diagnosis not present

## 2023-08-05 DIAGNOSIS — I2510 Atherosclerotic heart disease of native coronary artery without angina pectoris: Secondary | ICD-10-CM | POA: Diagnosis not present

## 2023-08-05 DIAGNOSIS — D692 Other nonthrombocytopenic purpura: Secondary | ICD-10-CM

## 2023-08-05 MED ORDER — EZETIMIBE 10 MG PO TABS: 10.0000 mg | ORAL_TABLET | Freq: Every day | ORAL | 3 refills | Status: DC

## 2023-08-05 MED ORDER — LEVOTHYROXINE SODIUM 125 MCG PO TABS: ORAL_TABLET | ORAL | 1 refills | Status: DC

## 2023-08-05 MED ORDER — PANTOPRAZOLE SODIUM 40 MG PO TBEC: 40.0000 mg | DELAYED_RELEASE_TABLET | Freq: Every day | ORAL | 1 refills | Status: DC

## 2023-08-05 MED ORDER — ROSUVASTATIN CALCIUM 40 MG PO TABS: 40.0000 mg | ORAL_TABLET | Freq: Every day | ORAL | 1 refills | Status: DC

## 2023-08-05 NOTE — Progress Notes (Signed)
Daily Session Note  Patient Details  Name: [name] MRN: [id number] Date of Birth: Feb 27, 1961 Referring Provider:   Flowsheet Row PULMONARY REHAB COPD ORIENTATION from 06/04/2023 in [facility]  Referring Provider Dr. Vassie Loll       Encounter Date: 08/05/2023  Check In:  Session Check In - 08/05/23 0810       Check-In   Supervising physician immediately available to respond to emergencies See telemetry face sheet for immediately available MD    Location [facility]    Staff Present Ross Ludwig, BS, Exercise Physiologist;Soma Lizak Juanetta Gosling, MA, RCEP, CCRP, CCET;Other   Ula Lingo RN   Virtual Visit No    Medication changes reported     No    Fall or balance concerns reported    No    Warm-up and Cool-down Performed on first and last piece of equipment    Resistance Training Performed Yes    VAD Patient? No    PAD/SET Patient? No      Pain Assessment   Currently in Pain? No/denies             Capillary Blood Glucose: No results found for this or any previous visit (from the past 24 hour(s)).    Social History   Tobacco Use  Smoking Status Former   Current packs/day: 0.00   Average packs/day: 3.0 packs/day for 33.0 years (99.0 ttl pk-yrs)   Types: Cigarettes   Start date: 10/27/1972   Quit date: 2007   Years since quitting: 17.8  Smokeless Tobacco Never    Goals Met:  Proper associated with RPD/PD & O2 Sat Independence with exercise equipment Using PLB without cueing & demonstrates good technique Exercise tolerated well No report of concerns or symptoms today Strength training completed today  Goals Unmet:  Not Applicable  Comments: Pt able to follow exercise prescription today without complaint.  Will continue to monitor for progression.

## 2023-08-05 NOTE — Progress Notes (Signed)
Subjective:    Patient ID: [name], female    DOB: 02-05-1961, 62 y.o.   MRN: [id number]  Discussed the use of AI scribe software for clinical note transcription with the patient, who gave verbal consent to proceed.  History of Present Illness   The patient, a participant in a pulmonary rehabilitation program, reports improvement in breathing and has not experienced any COPD flare-ups. She attends the program twice a week and has been consistent with her attendance. The patient has been proactive in maintaining her health, receiving an RSV shot in September, a recent flu shot, and is up-to-date with her pneumonia vaccinations.  Despite having contracted COVID-19 twice, the patient is hesitant about receiving the COVID-19 vaccine. She expresses concerns about the vaccine's efficacy due to the virus's frequent mutations.  The patient reports that her lung function was measured at 48% before starting pulmonary rehabilitation. During exercise, her oxygen levels drop to around 86-85%, but she can recover to 88% with rest. The patient is considering the use of supplemental oxygen for activity but is hesitant about becoming dependent on it. Baseline today O2 sat 97%  The patient has been making lifestyle changes to improve her health, including dietary modifications. She has cut back on bread and junk food, resulting in a weight loss of about 11-12 pounds since her last visit.  The patient also reports a family history of liver disease, with a brother who passed away from cirrhosis due to a fatty liver. She expresses relief that her liver enzymes look good.  The patient has been taking her prescribed medications regularly, including cholesterol medicine, an acid blocker, metoprolol, and thyroid medicine. She also takes an 81 mg aspirin daily with no reported bleeding issues.  The patient's cholesterol levels are nearly optimal, with an LDL of 71, just above the target of 70. To further reduce  LDL levels, the patient will be starting a low-dose ezetimibe.  The patient's thyroid function is well-controlled, with a good free T4 level. Her glucose and kidney function are also within normal limits.  The patient is active and independent, engaging in household chores and activities despite her pulmonary limitations. She expresses a strong desire to maintain her independence and continue her active lifestyle.         Review of Systems     Objective:    Physical Exam   VITALS: SaO2- 97% EXTREMITIES: No edema in legs. SKIN: Bruising on left arm.     General-in no acute distress Eyes-no discharge Lungs-respiratory rate normal, CTA CV-no murmurs,RRR Extremities skin warm dry no edema Neuro grossly normal Behavior normal, alert   Results for orders placed or performed during the hospital encounter of 05/12/23  Pulmonary Function Test  Result Value Ref Range   FVC-Pre 1.98 L   FVC-%Pred-Pre 69 %   FVC-Post 1.96 L   FVC-%Pred-Post 69 %   FVC-%Change-Post -1 %   FEV1-Pre 0.76 L   FEV1-%Pred-Pre 34 %   FEV1-Post 0.81 L   FEV1-%Pred-Post 37 %   FEV1-%Change-Post 6 %   FEV6-Pre 1.67 L   FEV6-%Pred-Pre 61 %   FEV6-Post 1.65 L   FEV6-%Pred-Post 60 %   FEV6-%Change-Post -1 %   Pre FEV1/FVC ratio 38 %   FEV1FVC-%Pred-Pre 49 %   Post FEV1/FVC ratio 41 %   FEV1FVC-%Change-Post 8 %   Pre FEV6/FVC Ratio 84 %   FEV6FVC-%Pred-Pre 87 %   Post FEV6/FVC ratio 84 %   FEV6FVC-%Pred-Post 87 %   FEV6FVC-%Change-Post 0 %  FEF 25-75 Pre 0.26 L/sec   FEF2575-%Pred-Pre 12 %   FEF 25-75 Post 0.24 L/sec   FEF2575-%Pred-Post 12 %   FEF2575-%Change-Post -6 %   RV 3.87 L   RV % pred 201 %   TLC 6.06 L   TLC % pred 131 %   DLCO unc 7.51 ml/min/mmHg   DLCO unc % pred 42 %   DL/VA 8.10 ml/min/mmHg/L   DL/VA % pred 48 %     Also should be noted lab work from 07/27/2023 was reviewed in detail Assessment & Plan:  Assessment and Plan    Chronic Obstructive Pulmonary Disease  (COPD) Patient reports improvement in breathing and no recent flare-ups. Currently participating in pulmonary rehab twice a week and finding it beneficial. Oxygen saturation drops to mid-80s during exercise but recovers quickly. Discussed potential future need for supplemental oxygen during activity, but patient prefers to manage symptoms by pacing activity at present. -Continue current management plan including pulmonary rehab. -Consider supplemental oxygen for use during activity if symptoms worsen.  Hyperlipidemia LDL slightly above target at 71 despite adherence to cholesterol medication. -Add Ezetimibe to current regimen to further reduce cholesterol absorption from food. -Repeat lipid panel at next follow-up.  Immunizations Up to date on RSV, influenza, and pneumonia vaccines. Discussed COVID-19 vaccination, but patient remains hesitant. -Continue to discuss benefits of COVID-19 vaccination at future visits.  General Health Maintenance -Plan bone density scan after patient turns 63 in February to assess for osteoporosis. -Advise patient to incorporate dietary calcium and continue Vitamin D supplementation. -Advise patient to discuss Vitamin E supplementation with pulmonologist due to potential increased risk of lung cancer. -Follow-up in spring (April/May) with repeat blood work.      Appropriate refills given Patient to reach out if any setbacks or any infections

## 2023-08-10 ENCOUNTER — Encounter (HOSPITAL_COMMUNITY)
Admission: RE | Admit: 2023-08-10 | Discharge: 2023-08-10 | Disposition: A | Discharge status: Home / Self Care | Payer: Medicare Other | Source: Ambulatory Visit | Attending: Pulmonary Disease

## 2023-08-10 DIAGNOSIS — J4489 Other specified chronic obstructive pulmonary disease: Secondary | ICD-10-CM | POA: Diagnosis not present

## 2023-08-10 NOTE — Progress Notes (Signed)
Daily Session Note  Patient Details  Name: [name] MRN: [id number] Date of Birth: 1961/04/23 Referring Provider:   Flowsheet Row PULMONARY REHAB COPD ORIENTATION from 06/04/2023 in [facility]  Referring Provider Dr. Vassie Loll       Encounter Date: 08/10/2023  Check In:  Session Check In - 08/10/23 0757       Check-In   Supervising physician immediately available to respond to emergencies See telemetry face sheet for immediately available MD    Location [facility]    Staff Present Ross Ludwig, BS, Exercise Physiologist;Jessica Juanetta Gosling, MA, RCEP, CCRP, CCET    Virtual Visit No    Medication changes reported     No    Fall or balance concerns reported    No    Tobacco Cessation No Change    Warm-up and Cool-down Performed on first and last piece of equipment    Resistance Training Performed Yes    VAD Patient? No    PAD/SET Patient? No      Pain Assessment   Currently in Pain? No/denies    Multiple Pain Sites No             Capillary Blood Glucose: No results found for this or any previous visit (from the past 24 hour(s)).    Social History   Tobacco Use  Smoking Status Former   Current packs/day: 0.00   Average packs/day: 3.0 packs/day for 33.0 years (99.0 ttl pk-yrs)   Types: Cigarettes   Start date: 10/27/1972   Quit date: 2007   Years since quitting: 17.8  Smokeless Tobacco Never    Goals Met:  Independence with exercise equipment Exercise tolerated well No report of concerns or symptoms today Strength training completed today  Goals Unmet:  Not Applicable  Comments: Pt able to follow exercise prescription today without complaint.  Will continue to monitor for progression.

## 2023-08-12 ENCOUNTER — Encounter (HOSPITAL_COMMUNITY)
Admission: RE | Admit: 2023-08-12 | Discharge: 2023-08-12 | Disposition: A | Discharge status: Home / Self Care | Payer: Medicare Other | Source: Ambulatory Visit | Attending: Pulmonary Disease | Admitting: Pulmonary Disease

## 2023-08-12 DIAGNOSIS — J4489 Other specified chronic obstructive pulmonary disease: Secondary | ICD-10-CM | POA: Diagnosis not present

## 2023-08-12 NOTE — Progress Notes (Signed)
Daily Session Note  Patient Details  Name: [name] MRN: [id number] Date of Birth: 12-19-1960 Referring Provider:   Flowsheet Row PULMONARY REHAB COPD ORIENTATION from 06/04/2023 in [facility] CARDIAC RE[facility]IL[facility]  Referring Provider Dr. Vassie Loll       Encounter Date: 08/12/2023  Check In:  Session Check In - 08/12/23 0745       Check-In   Supervising physician immediately available to respond to emergencies See telemetry face sheet for immediately available MD    Location [facility]    Staff Present Ross Ludwig, BS, Exercise Physiologist;Other    Virtual Visit No    Medication changes reported     No    Fall or balance concerns reported    No    Tobacco Cessation No Change    Warm-up and Cool-down Performed on first and last piece of equipment    Resistance Training Performed Yes    VAD Patient? No    PAD/SET Patient? No      Pain Assessment   Currently in Pain? No/denies    Multiple Pain Sites No             Capillary Blood Glucose: No results found for this or any previous visit (from the past 24 hour(s)).    Social History   Tobacco Use  Smoking Status Former   Current packs/day: 0.00   Average packs/day: 3.0 packs/day for 33.0 years (99.0 ttl pk-yrs)   Types: Cigarettes   Start date: 10/27/1972   Quit date: 2007   Years since quitting: 17.8  Smokeless Tobacco Never    Goals Met:  Independence with exercise equipment Using PLB without cueing & demonstrates good technique Exercise tolerated well No report of concerns or symptoms today Strength training completed today  Goals Unmet:  Not Applicable  Comments: Pt able to follow exercise prescription today without complaint.  Will continue to monitor for progression.

## 2023-08-16 NOTE — Progress Notes (Unsigned)
@Patient  ID: [name], female    DOB: Jan 11, 1961, 62 y.o.   MRN: [id number]  No chief complaint on file.   Referring provider: Babs Sciara, MD  HPI: 62 year old female.  Past medical history significant for chronic respiratory failure, COPD, GERD, hypothyroidism, coronary disease, hypertension. Ptient of Dr. Vassie Loll, last seen on 05/27/23  08/17/2023- interim hx  Patient presents today for follow-up/ severe COPD During last visit patient was referred to pulmonary rehab  Maintained on Trelegy       No Known Allergies  Immunization History  Administered Date(s) Administered   Influenza, Seasonal, Injecte, Preservative Fre 08/05/2023   Influenza,inj,Quad PF,6+ Mos 07/27/2014, 07/09/2015, 07/21/2016, 07/21/2018, 07/23/2020, 08/06/2021, 07/09/2022   Influenza,inj,quad, With Preservative 08/17/2017, 06/14/2019   Influenza-Unspecified 08/17/2017, 06/14/2019   Moderna SARS-COV2 Booster Vaccination 07/11/2021   Moderna Sars-Covid-2 Vaccination 12/14/2019, 01/09/2020   Pneumococcal Conjugate-13 08/28/2015   Pneumococcal Polysaccharide-23 08/23/2014   Td 06/10/2018, 05/14/2021    Past Medical History:  Diagnosis Date   COPD (chronic obstructive pulmonary disease) (HCC)    Coronary atherosclerosis of native coronary artery    DES RCA 5/09, nonobstructive left system   Essential hypertension    Hypothyroidism    Mixed hyperlipidemia    NSTEMI (non-ST elevated myocardial infarction) (HCC)    2009    Tobacco History: Social History   Tobacco Use  Smoking Status Former   Current packs/day: 0.00   Average packs/day: 3.0 packs/day for 33.0 years (99.0 ttl pk-yrs)   Types: Cigarettes   Start date: 10/27/1972   Quit date: 2007   Years since quitting: 17.9  Smokeless Tobacco Never   Counseling given: Not Answered   Outpatient Medications Prior to Visit  Medication Sig Dispense Refill   albuterol (PROVENTIL) (2.5 MG/3ML) 0.083% nebulizer solution USE 1 VIAL IN  NEBULIZER EVERY 4 HOURS AS NEEDED FOR SHORTNESS OF BREATH 360 mL 0   albuterol (VENTOLIN HFA) 108 (90 Base) MCG/ACT inhaler Inhale 2 puffs into the lungs every 4 (four) hours as needed for wheezing or shortness of breath. 8 g 5   aspirin 81 MG EC tablet Take 81 mg by mouth at bedtime.     Cholecalciferol (VITAMIN D3) 400 units CAPS Take 400 Units by mouth at bedtime.      ezetimibe (ZETIA) 10 MG tablet Take 1 tablet (10 mg total) by mouth daily. 90 tablet 3   Fluticasone-Umeclidin-Vilant (TRELEGY ELLIPTA) 100-62.5-25 MCG/ACT AEPB Inhale 1 puff into the lungs daily. 1 each 11   levothyroxine (SYNTHROID) 125 MCG tablet 1 qam all days except Mondays  use 1/2 in the am. 90 tablet 1   metoprolol succinate (TOPROL-XL) 25 MG 24 hr tablet 1/2 qd 90 tablet 3   Multiple Vitamin (MULTIVITAMIN) tablet Take 1 tablet by mouth daily.     nitroGLYCERIN (NITROSTAT) 0.4 MG SL tablet Place 1 tablet (0.4 mg total) under the tongue every 5 (five) minutes x 3 doses as needed for chest pain (if no relief after 3rd dose, proceed to ED or call 911). 25 tablet 3   pantoprazole (PROTONIX) 40 MG tablet Take 1 tablet (40 mg total) by mouth daily. 90 tablet 1   rosuvastatin (CRESTOR) 40 MG tablet Take 1 tablet (40 mg total) by mouth daily. 90 tablet 1   vitamin C (ASCORBIC ACID) 500 MG tablet Take 500 mg by mouth daily.     vitamin E 180 MG (400 UNITS) capsule Take 400 Units by mouth daily.     Facility-Administered Medications Prior to Visit  Medication Dose Route Frequency Provider Last Rate Last Admin   [facility] dose#1 phenylephrine 1%/ketorolac 0.3% (OMIDRIA) in BSS 500 ML (MUST DILUTE PRIOR TO USE) Optime    PRN Pecolia Ades, MD   500 mL at 11/14/22 1054      Review of Systems  Review of Systems   Physical Exam  There were no vitals taken for this visit. Physical Exam   Lab Results:  CBC    Component Value Date/Time   WBC 5.2 09/08/2022 0828   WBC 5.8 03/01/2020 0730   RBC 4.58 09/08/2022 0828    RBC 4.73 03/01/2020 0730   HGB 14.4 09/08/2022 0828   HCT 42.5 09/08/2022 0828   PLT 237 09/08/2022 0828   MCV 93 09/08/2022 0828   MCH 31.4 09/08/2022 0828   MCH 30.2 03/01/2020 0730   MCHC 33.9 09/08/2022 0828   MCHC 32.6 03/01/2020 0730   RDW 12.2 09/08/2022 0828   LYMPHSABS 2.0 09/08/2022 0828   MONOABS 0.8 06/21/2018 1638   EOSABS 0.1 09/08/2022 0828   BASOSABS 0.1 09/08/2022 0828    BMET    Component Value Date/Time   NA 141 07/27/2023 0917   K 4.6 07/27/2023 0917   CL 103 07/27/2023 0917   CO2 24 07/27/2023 0917   GLUCOSE 89 07/27/2023 0917   GLUCOSE 91 03/01/2020 0730   BUN 17 07/27/2023 0917   CREATININE 0.81 07/27/2023 0917   CREATININE 0.87 03/01/2020 0730   CALCIUM 9.2 07/27/2023 0917   GFRNONAA 79 05/18/2019 0722   GFRAA 91 05/18/2019 0722    BNP No results found for: "BNP"  ProBNP No results found for: "PROBNP"  Imaging: No results found.   Assessment & Plan:   No problem-specific Assessment & Plan notes found for this encounter.     Glenford Bayley, NP 08/16/2023

## 2023-08-17 ENCOUNTER — Ambulatory Visit: Payer: Medicare Other | Admitting: Primary Care

## 2023-08-17 ENCOUNTER — Encounter: Payer: Self-pay | Admitting: Primary Care

## 2023-08-17 ENCOUNTER — Encounter (HOSPITAL_COMMUNITY)
Admission: RE | Admit: 2023-08-17 | Discharge: 2023-08-17 | Disposition: A | Discharge status: Home / Self Care | Payer: Medicare Other | Source: Ambulatory Visit | Attending: Pulmonary Disease

## 2023-08-17 VITALS — BP 120/78 | HR 77 | Ht 61.0 in | Wt 137.0 lb

## 2023-08-17 DIAGNOSIS — E782 Mixed hyperlipidemia: Secondary | ICD-10-CM

## 2023-08-17 DIAGNOSIS — J9611 Chronic respiratory failure with hypoxia: Secondary | ICD-10-CM

## 2023-08-17 DIAGNOSIS — J4489 Other specified chronic obstructive pulmonary disease: Secondary | ICD-10-CM

## 2023-08-17 DIAGNOSIS — Z87891 Personal history of nicotine dependence: Secondary | ICD-10-CM

## 2023-08-17 NOTE — Patient Instructions (Addendum)
-  CHRONIC OBSTRUCTIVE PULMONARY DISEASE (COPD): COPD is a chronic lung condition that makes it hard to breathe. You have shown improvement with pulmonary rehab and Trelegy. Continue using Trelegy daily, attend pulmonary rehab, and consider a maintenance program. Use albuterol as needed and notify us if you experience symptoms of bronchitis.  -HYPERLIPIDEMIA: Hyperlipidemia means having high levels of fats (lipids) in your blood, which can increase the risk of heart disease. Your LDL is slightly above target despite taking Crestor. Continue with Crestor and discuss further cholesterol management with Dr. Gerda Diss at your next follow-up.  -VACCINATIONS: You are up to date on flu and RSV vaccines. Consider getting a COVID-19 booster due to waning immunity and your high-risk status.  -WEIGHT MANAGEMENT: Your recent weight loss has contributed to improved energy and exercise tolerance. Continue with your current diet and exercise regimen.  INSTRUCTIONS: Continue Trelegy one puff once daily and Albuterol as needed. Please continue to monitor your oxygen levels at home and during pulmonary rehab. Notify office if you develop acute symptoms. Follow up with Dr. Gerda Diss for cholesterol management. Consider getting a COVID-19 booster due to waning immunity and your high-risk status  Follow-up April with Dr. Sherene Sires (30 min new patient) or APP

## 2023-08-17 NOTE — Progress Notes (Signed)
Daily Session Note  Patient Details  Name: [name] MRN: [id number] Date of Birth: Sep 16, 1961 Referring Provider:   Flowsheet Row PULMONARY REHAB COPD ORIENTATION from 06/04/2023 in [facility] CARDIAC REHABILITATION  Referring Provider Dr. Vassie Loll       Encounter Date: 08/17/2023  Check In:  Session Check In - 08/17/23 0756       Check-In   Supervising physician immediately available to respond to emergencies See telemetry face sheet for immediately available MD    Location [facility]    Staff Present Ross Ludwig, BS, Exercise Physiologist;Debra Laural Benes, RN, Thomos Lemons, MA, RCEP, CCRP, CCET    Virtual Visit No    Medication changes reported     No    Fall or balance concerns reported    No    Warm-up and Cool-down Performed on first and last piece of equipment    Resistance Training Performed Yes    VAD Patient? No    PAD/SET Patient? No      Pain Assessment   Currently in Pain? No/denies             Capillary Blood Glucose: No results found for this or any previous visit (from the past 24 hour(s)).    Social History   Tobacco Use  Smoking Status Former   Current packs/day: 0.00   Average packs/day: 3.0 packs/day for 33.0 years (99.0 ttl pk-yrs)   Types: Cigarettes   Start date: 10/27/1972   Quit date: 2007   Years since quitting: 17.9  Smokeless Tobacco Never    Goals Met:  Proper associated with RPD/PD & O2 Sat Independence with exercise equipment Using PLB without cueing & demonstrates good technique Exercise tolerated well No report of concerns or symptoms today Strength training completed today  Goals Unmet:  Not Applicable  Comments: Pt able to follow exercise prescription today without complaint.  Will continue to monitor for progression.

## 2023-08-19 ENCOUNTER — Encounter (HOSPITAL_COMMUNITY)
Admission: RE | Admit: 2023-08-19 | Discharge: 2023-08-19 | Disposition: A | Discharge status: Home / Self Care | Payer: Medicare Other | Source: Ambulatory Visit | Attending: Pulmonary Disease

## 2023-08-19 DIAGNOSIS — J4489 Other specified chronic obstructive pulmonary disease: Secondary | ICD-10-CM | POA: Diagnosis not present

## 2023-08-19 NOTE — Progress Notes (Signed)
Daily Session Note  Patient Details  Name: [name] MRN: [id number] Date of Birth: [birth year] Referring Provider:   Flowsheet Row PULMONARY REHAB COPD ORIENTATION from 06/04/2023 in [facility] CARDIAC REHABIL[facility]  Referring Provider Dr. Vassie Loll       Encounter Date: 08/19/2023  Check In:  Session Check In - 08/19/23 0745       Check-In   Supervising physician immediately available to respond to emergencies See telemetry face sheet for immediately available MD    Location [facility]    Staff Present Ross Ludwig, BS, Exercise Physiologist   Brooke, RN   Virtual Visit No    Medication changes reported     No    Fall or balance concerns reported    No    Tobacco Cessation No Change    Warm-up and Cool-down Performed on first and last piece of equipment    Resistance Training Performed Yes    VAD Patient? No    PAD/SET Patient? No      Pain Assessment   Currently in Pain? No/denies    Multiple Pain Sites No             Capillary Blood Glucose: No results found for this or any previous visit (from the past 24 hour(s)).    Social History   Tobacco Use  Smoking Status Former   Current packs/day: 0.00   Average packs/day: 3.0 packs/day for 33.0 years (99.0 ttl pk-yrs)   Types: Cigarettes   Start date: 10/27/1972   Quit date: 2007   Years since quitting: 17.9  Smokeless Tobacco Never    Goals Met:  Independence with exercise equipment Using PLB without cueing & demonstrates good technique Exercise tolerated well Queuing for purse lip breathing No report of concerns or symptoms today Strength training completed today  Goals Unmet:  Not Applicable  Comments: Pt able to follow exercise prescription today without complaint.  Will continue to monitor for progression.

## 2023-08-24 ENCOUNTER — Encounter (HOSPITAL_COMMUNITY): Payer: Medicare Other

## 2023-08-26 ENCOUNTER — Encounter (HOSPITAL_COMMUNITY): Payer: Medicare Other

## 2023-08-26 ENCOUNTER — Encounter (HOSPITAL_COMMUNITY): Payer: Self-pay | Admitting: *Deleted

## 2023-08-26 DIAGNOSIS — J4489 Other specified chronic obstructive pulmonary disease: Secondary | ICD-10-CM

## 2023-08-26 NOTE — Progress Notes (Signed)
Pulmonary Individual Treatment Plan  Patient Details  Name: [name] MRN: [id number] Date of Birth: [birth year] Referring Provider:   Flowsheet Row PULMONARY REHAB COPD ORIENTATION from 06/04/2023 in [facility] CARDIAC REHABILITATION  Referring Provider Dr. 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 , Disp: , Rfl:    ezetimibe (ZETIA) 10 MG tablet, Take 1 tablet (10 mg total) by mouth daily., Disp: 90 tablet, Rfl: 3   Fluticasone-Umeclidin-Vilant (TRELEGY ELLIPTA) 100-62.5-25 MCG/ACT AEPB, Inhale 1 puff into the lungs daily., Disp: 1 each, Rfl: 11   levothyroxine (SYNTHROID) 125 MCG tablet, 1 qam all days except Mondays  use 1/2 in the am., Disp: 90 tablet, Rfl: 1   metoprolol succinate (TOPROL-XL) 25 MG 24 hr tablet, 1/2 qd, Disp: 90 tablet, Rfl: 3   Multiple Vitamin (MULTIVITAMIN) tablet, Take 1 tablet by mouth daily., Disp: , Rfl:    nitroGLYCERIN (NITROSTAT) 0.4 MG SL tablet, Place 1 tablet (0.4 mg total) under the tongue every 5 (five) minutes x 3 doses as needed for chest pain (if no relief after 3rd dose, proceed to ED or call 911)., Disp: 25 tablet, Rfl: 3   pantoprazole (PROTONIX) 40 MG tablet, Take 1 tablet (40 mg total) by mouth daily., Disp: 90  tablet, Rfl: 1   rosuvastatin (CRESTOR) 40 MG tablet, Take 1 tablet (40 mg total) by mouth daily., Disp: 90 tablet, Rfl: 1   vitamin C (ASCORBIC ACID) 500 MG tablet, Take 500 mg by mouth daily., Disp: , Rfl:    vitamin E 180 MG (400 UNITS) capsule, Take 400 Units by mouth daily., Disp: , Rfl:  No current facility-administered medications for this visit.  Facility-Administered Medications Ordered in Other Visits:    [facility] dose#1 phenylephrine 1%/ketorolac 0.3% (OMIDRIA) in BSS 500 ML (MUST DILUTE PRIOR TO USE) [employer]ime, , , PRN, Pecolia Ades, MD, 500 mL at 11/14/22 1054  Past Medical History: Past Medical History:  Diagnosis Date   COPD (chronic obstructive pulmonary disease) (HCC)    Coronary atherosclerosis of native coronary artery    DES RCA 5/09, nonobstructive left system   Essential hypertension    Hypothyroidism    Mixed hyperlipidemia    NSTEMI (non-ST elevated myocardial infarction) (HCC)    2009    Tobacco Use: Social History   Tobacco Use  Smoking Status Former   Current packs/day: 0.00   Average packs/day: 3.0 packs/day for 33.0 years (99.0 ttl pk-yrs)   Types: Cigarettes   Start date: 10/27/1972   Quit date: 2007   Years since quitting: 17.9  Smokeless Tobacco Never    Labs: Review Flowsheet  More data exists      Latest Ref Rng & Units 07/29/2021 01/01/2022 07/03/2022  01/19/2023 07/27/2023  Labs for ITP Cardiac and Pulmonary Rehab  Cholestrol 100 - 199 mg/dL 161  096  045  409  811   LDL (calc) 0 - 99 mg/dL 94  70  90  83  71   HDL-C >39 mg/dL 49  57  52  53  53   Trlycerides 0 - 149 mg/dL 914  782  956  213  086     Details            Capillary Blood Glucose: Lab Results  Component Value Date   GLUCAP 97 06/21/2018     Pulmonary Assessment Scores:  Pulmonary Assessment Scores     Row Name 06/08/23 0811         ADL UCSD   ADL Phase Entry     SOB Score total 54     Rest 0     Walk 3     Stairs 3     Bath 1     Dress 1     Shop  3       CAT Score   CAT Score 18       mMRC Score   mMRC Score 3             UCSD: Self-administered rating of dyspnea associated with activities of daily living (ADLs) 6-point scale (0 = "not at all" to 5 = "maximal or unable to do because of breathlessness")  Scoring Scores range from 0 to 120.  Minimally important difference is 5 units  CAT: CAT can identify the health impairment of COPD patients and is better correlated with disease progression.  CAT has a scoring range of zero to 40. The CAT score is classified into four groups of low (less than 10), medium (10 - 20), high (21-30) and very high (31-40) based on the impact level of disease on health status. A CAT score over 10 suggests significant symptoms.  A worsening CAT score could be explained by an exacerbation, poor medication adherence, poor inhaler technique, or progression of COPD or comorbid conditions.  CAT MCID is 2 points  mMRC: mMRC (Modified Medical Research Council) Dyspnea Scale is used to assess the degree of baseline functional disability in patients of respiratory disease due to dyspnea. No minimal important difference is established. A decrease in score of 1 point or greater is considered a positive change.   Pulmonary Function Assessment:   Exercise Target Goals: Exercise Program Goal: Individual exercise prescription set using results from initial 6 min walk test and THRR while considering  patient's activity barriers and safety.   Exercise Prescription Goal: Initial exercise prescription builds to 30-45 minutes a day of aerobic activity, 2-3 days per week.  Home exercise guidelines will be given to patient during program as part of exercise prescription that the participant will acknowledge.  Activity Barriers & Risk Stratification:  Activity Barriers & Cardiac Risk Stratification - 05/29/23 0907       Activity Barriers & Cardiac Risk Stratification   Activity Barriers Shortness of  Breath;Deconditioning    Cardiac Risk Stratification Moderate             6 Minute Walk:  6 Minute Walk     Row Name 06/04/23 1003         6 Minute Walk   Phase Initial     Distance 1060 feet     Walk Time 6 minutes     # of Rest Breaks 0  MPH 2.01     METS 2.66     RPE 11     Perceived Dyspnea  1     VO2 Peak 9.31     Symptoms No     Resting HR 67 bpm     Resting BP 110/60     Resting Oxygen Saturation  94 %     Exercise Oxygen Saturation  during 6 min walk 85 %     Max Ex. HR 90 bpm     Max Ex. BP 132/60     2 Minute Post BP 120/60       Interval HR   1 Minute HR 106     2 Minute HR 93     3 Minute HR 88     4 Minute HR 90     5 Minute HR 88     6 Minute HR 89     2 Minute Post HR 75     Interval Heart Rate? Yes       Interval Oxygen   Interval Oxygen? Yes     Baseline Oxygen Saturation % 94 %     1 Minute Oxygen Saturation % 88 %     1 Minute Liters of Oxygen 0 L     2 Minute Oxygen Saturation % 86 %     2 Minute Liters of Oxygen 0 L     3 Minute Oxygen Saturation % 86 %     3 Minute Liters of Oxygen 0 L     4 Minute Oxygen Saturation % 85 %     4 Minute Liters of Oxygen 0 L     5 Minute Oxygen Saturation % 86 %     5 Minute Liters of Oxygen 0 L     6 Minute Oxygen Saturation % 87 %     6 Minute Liters of Oxygen 0 L     2 Minute Post Oxygen Saturation % 94 %     2 Minute Post Liters of Oxygen 0 L              Oxygen Initial Assessment:   Oxygen Re-Evaluation:  Oxygen Re-Evaluation     Row Name 06/08/23 0802 06/15/23 0819 07/06/23 0813 08/03/23 0807       Program Oxygen Prescription   Program Oxygen Prescription -- None None None      Home Oxygen   Home Oxygen Device -- None None None    Sleep Oxygen Prescription -- None None None    Home Exercise Oxygen Prescription -- None None None    Home Resting Oxygen Prescription -- None None None      Goals/Expected Outcomes   Short Term Goals -- -- To learn and understand importance  of monitoring SPO2 with pulse oximeter and demonstrate accurate use of the pulse oximeter.;To learn and understand importance of maintaining oxygen saturations>88%;To learn and demonstrate proper pursed lip breathing techniques or other breathing techniques. ;To learn and demonstrate proper use of respiratory medications To learn and understand importance of monitoring SPO2 with pulse oximeter and demonstrate accurate use of the pulse oximeter.;To learn and understand importance of maintaining oxygen saturations>88%;To learn and demonstrate proper pursed lip breathing techniques or other breathing techniques. ;To learn and demonstrate proper use of respiratory medications    Long  Term Goals -- -- Verbalizes importance of monitoring SPO2 with pulse oximeter and return demonstration;Maintenance of O2 saturations>88%;Compliance with respiratory medication;Exhibits proper breathing techniques, such as pursed lip breathing or other  method taught during program session Verbalizes importance of monitoring SPO2 with pulse oximeter and return demonstration;Maintenance of O2 saturations>88%;Compliance with respiratory medication;Exhibits proper breathing techniques, such as pursed lip breathing or other method taught during program session    Comments Reviewed PLB technique with pt.  Talked about how it works and it's importance in maintaining their exercise saturations. Mickayla is doing well in the program so far. Her breathing is staying WNL when exrcising 88-90s. She feels her breathing improving each week and her energy increaing as well. She does breath through her mouth and is trying PLB. Shontel is doing well in rehab.  Her breathing is doing better and she is feelng better overall.  Her sats conitnue to drop some with exercise but she does not feel too winded. She is good about using her PLB to get it to recover.  Her family has also noticed a difference in her breathing too. Brindley is doing well in rehab. Her  breathing is getting better, but her saturations continue to be on the lower side.  She is going to doctor on Wednesday and willl take her report with her that day.  She will talk to them about her numbers.  She has been using her PLB to help with breathing.  She is pleased with her improvement and progress.    Goals/Expected Outcomes Short: Become more profiecient at using PLB. Long: Become independent at using PLB. Short : continue to foucse on PLB when exercign    long: continue to monitor breathing and oxygen stats when exercising Short: Conitnue to use to help with PLB.  Long: Continue to monitor saturations closely. Short: Take saturations to doctor on Wednesday Long; Continue to improve breathing.             Oxygen Discharge (Final Oxygen Re-Evaluation):  Oxygen Re-Evaluation - 08/03/23 0807       Program Oxygen Prescription   Program Oxygen Prescription None      Home Oxygen   Home Oxygen Device None    Sleep Oxygen Prescription None    Home Exercise Oxygen Prescription None    Home Resting Oxygen Prescription None      Goals/Expected Outcomes   Short Term Goals To learn and understand importance of monitoring SPO2 with pulse oximeter and demonstrate accurate use of the pulse oximeter.;To learn and understand importance of maintaining oxygen saturations>88%;To learn and demonstrate proper pursed lip breathing techniques or other breathing techniques. ;To learn and demonstrate proper use of respiratory medications    Long  Term Goals Verbalizes importance of monitoring SPO2 with pulse oximeter and return demonstration;Maintenance of O2 saturations>88%;Compliance with respiratory medication;Exhibits proper breathing techniques, such as pursed lip breathing or other method taught during program session    Comments Trudie is doing well in rehab. Her breathing is getting better, but her saturations continue to be on the lower side.  She is going to doctor on Wednesday and willl take her  report with her that day.  She will talk to them about her numbers.  She has been using her PLB to help with breathing.  She is pleased with her improvement and progress.    Goals/Expected Outcomes Short: Take saturations to doctor on Wednesday Long; Continue to improve breathing.             Initial Exercise Prescription:  Initial Exercise Prescription - 06/04/23 1000       Date of Initial Exercise RX and Referring Provider   Date 06/04/23    Referring Provider  Dr. Vassie Loll      Oxygen   Maintain Oxygen Saturation 88% or higher      Treadmill   MPH 2    Grade 0    Minutes 15      Recumbant Elliptical   Level 1    RPM 60    Minutes 15      Prescription Details   Frequency (times per week) 2    Duration Progress to 30 minutes of continuous aerobic without signs/symptoms of physical distress      Intensity   THRR 40-80% of Max Heartrate 103-138    Ratings of Perceived Exertion 11-13    Perceived Dyspnea 0-4      Resistance Training   Training Prescription Yes    Weight 3    Reps 10-15             Perform Capillary Blood Glucose checks as needed.  Exercise Prescription Changes:   Exercise Prescription Changes     Row Name 06/15/23 1300 07/01/23 1300 07/06/23 0800 07/13/23 1200 07/27/23 1200     Response to Exercise   Blood Pressure (Admit) 118/70 122/68 -- 124/64 118/60   Blood Pressure (Exercise) 126/70 132/70 -- -- --   Blood Pressure (Exit) 100/70 112/64 -- 110/60 110/60   Heart Rate (Admit) 73 bpm 70 bpm -- 71 bpm 67 bpm   Heart Rate (Exercise) 85 bpm 93 bpm -- 90 bpm 90 bpm   Heart Rate (Exit) 75 bpm 81 bpm -- 74 bpm 79 bpm   Oxygen Saturation (Admit) 95 % 93 % -- 91 % 92 %   Oxygen Saturation (Exercise) 88 % 90 % -- 88 % 88 %   Oxygen Saturation (Exit) 94 % 91 % -- 94 % 94 %   Rating of Perceived Exertion (Exercise) 12 12 -- 12 12   Perceived Dyspnea (Exercise) 1 1 -- 2 2   Duration Continue with 30 min of aerobic exercise without signs/symptoms of  physical distress. Continue with 30 min of aerobic exercise without signs/symptoms of physical distress. -- Continue with 30 min of aerobic exercise without signs/symptoms of physical distress. Continue with 30 min of aerobic exercise without signs/symptoms of physical distress.   Intensity THRR unchanged THRR unchanged -- THRR unchanged THRR unchanged     Progression   Progression Continue to progress workloads to maintain intensity without signs/symptoms of physical distress. Continue to progress workloads to maintain intensity without signs/symptoms of physical distress. -- Continue to progress workloads to maintain intensity without signs/symptoms of physical distress. Continue to progress workloads to maintain intensity without signs/symptoms of physical distress.     Resistance Training   Training Prescription Yes Yes -- Yes Yes   Weight 3 lbs 3 lbs -- 3 lbs / blue band 3 lbs / blue band   Reps 10-15 10-15 -- 10-15 10-15   Time 1 Minutes -- -- -- --     Treadmill   MPH 2 2.2 -- 2 1.7   Grade 0 0.5 -- 0.5 0   Minutes 15 15 -- 15 15   METs 2.53 2.84 -- 2.67 2.3     Recumbant Elliptical   Level 3 3 -- 3 3   RPM 47 38 -- 45 47   Minutes 15 15 -- 15 15   METs 2.3 2.5 -- 3.1 3.1     Home Exercise Plan   Plans to continue exercise at -- -- Home (comment)  walking, Cardioglide Home (comment) Home (comment)   Frequency -- --  Add 2 additional days to program exercise sessions. Add 2 additional days to program exercise sessions. Add 2 additional days to program exercise sessions.   Initial Home Exercises Provided -- -- 07/06/23 -- --     Oxygen   Maintain Oxygen Saturation 88% or higher 88% or higher -- 88% or higher 88% or higher    Row Name 08/10/23 1500             Response to Exercise   Blood Pressure (Admit) 104/66       Blood Pressure (Exit) 102/60       Heart Rate (Admit) 72 bpm       Heart Rate (Exercise) 95 bpm       Heart Rate (Exit) 78 bpm       Oxygen Saturation  (Admit) 95 %       Oxygen Saturation (Exercise) 85 %       Oxygen Saturation (Exit) 94 %       Rating of Perceived Exertion (Exercise) 13       Perceived Dyspnea (Exercise) 2       Duration Continue with 30 min of aerobic exercise without signs/symptoms of physical distress.       Intensity THRR unchanged         Progression   Progression Continue to progress workloads to maintain intensity without signs/symptoms of physical distress.         Resistance Training   Training Prescription Yes       Weight 3 lbs                Exercise Comments:   Exercise Comments     Row Name 06/17/23 [phone]           Exercise Comments Reviewed home exercise                Exercise Goals and Review:   Exercise Goals     Row Name 06/04/23 1005             Exercise Goals   Increase Physical Activity Yes       Intervention Provide advice, education, support and counseling about physical activity/exercise needs.;Develop an individualized exercise prescription for aerobic and resistive training based on initial evaluation findings, risk stratification, comorbidities and participant's personal goals.       Expected Outcomes Short Term: Attend rehab on a regular basis to increase amount of physical activity.;Long Term: Add in home exercise to make exercise part of routine and to increase amount of physical activity.;Long Term: Exercising regularly at least 3-5 days a week.       Increase Strength and Stamina Yes       Intervention Provide advice, education, support and counseling about physical activity/exercise needs.;Develop an individualized exercise prescription for aerobic and resistive training based on initial evaluation findings, risk stratification, comorbidities and participant's personal goals.       Expected Outcomes Short Term: Increase workloads from initial exercise prescription for resistance, speed, and METs.;Short Term: Perform resistance training exercises routinely  during rehab and add in resistance training at home;Long Term: Improve cardiorespiratory fitness, muscular endurance and strength as measured by increased METs and functional capacity ( )       Able to understand and use rate of perceived exertion (RPE) scale Yes       Intervention Provide education and explanation on how to use RPE scale       Expected Outcomes Short Term: Able to use RPE daily in rehab to express  subjective intensity level;Long Term:  Able to use RPE to guide intensity level when exercising independently       Able to understand and use Dyspnea scale Yes       Intervention Provide education and explanation on how to use Dyspnea scale       Expected Outcomes Short Term: Able to use Dyspnea scale daily in rehab to express subjective sense of shortness of breath during exertion;Long Term: Able to use Dyspnea scale to guide intensity level when exercising independently       Knowledge and understanding of Target Heart Rate Range (THRR) Yes       Intervention Provide education and explanation of THRR including how the numbers were predicted and where they are located for reference       Expected Outcomes Short Term: Able to use daily as guideline for intensity in rehab;Short Term: Able to state/look up THRR;Long Term: Able to use THRR to govern intensity when exercising independently       Able to check pulse independently Yes       Intervention Provide education and demonstration on how to check pulse in carotid and radial arteries.;Review the importance of being able to check your own pulse for safety during independent exercise       Expected Outcomes Short Term: Able to explain why pulse checking is important during independent exercise;Long Term: Able to check pulse independently and accurately       Understanding of Exercise Prescription Yes       Intervention Provide education, explanation, and written materials on patient's individual exercise prescription       Expected  Outcomes Short Term: Able to explain program exercise prescription;Long Term: Able to explain home exercise prescription to exercise independently                Exercise Goals Re-Evaluation :  Exercise Goals Re-Evaluation     Row Name 06/08/23 0800 06/15/23 0757 06/15/23 1332 07/01/23 1312 07/06/23 0759     Exercise Goal Re-Evaluation   Exercise Goals Review Increase Physical Activity;Increase Strength and Stamina;Able to understand and use Dyspnea scale;Understanding of Exercise Prescription Increase Physical Activity;Increase Strength and Stamina;Understanding of Exercise Prescription Increase Physical Activity;Increase Strength and Stamina;Understanding of Exercise Prescription Increase Physical Activity;Increase Strength and Stamina;Understanding of Exercise Prescription Increase Physical Activity;Increase Strength and Stamina;Understanding of Exercise Prescription   Comments Reviewed RPE  and dyspnea scale, THR and program prescription with pt today.  Pt voiced understanding and was given a copy of goals to take home. Miku has just started the program in the past week. She has been doing great in the program so far. She continues to walk everyday. She stated that she feels better and has more energy. She enjoys coming to rehab and aslo likes the social aspect. Sahori has tolerated exercise well. She is increaing her level on the XR to level 3. She is also walking everyday. Will continue to monitor and progress as able. Aolanis has been tolerating exericse well, She has inceased her speed and incline on the treadmill in the last week to 2.2/0.5. Will continue to monitor and progress as able. Abbiegale is doing well in rehab.  She is starting to improve her stamina.  Her breathing is starting to improve.  Reviewed home exercise with pt today.  Pt plans to walk and use Cardioglide at home for exercise.  She also has a exercise bike in her basement.  Reviewed THR, pulse, RPE, sign and symptoms,  pulse oximetery and when  to call 911 or MD.  Also discussed weather considerations and indoor options.  Pt voiced understanding.   Expected Outcomes Short: Use RPE daily to regulate intensity. Long: Follow program prescription in THR. Short term: go over home exercise   long term: continue to attend rehab Short: continue to exercise at RPE of 12 then increase workload    Long term: continue to attend rehab Short term: continue to exerise at workload and increase when ready   long term: continue to attend rehab Short: start to add in more exercise at home. Long: conintue to improve stamina.    Row Name 07/28/23 0737 08/03/23 0758           Exercise Goal Re-Evaluation   Exercise Goals Review Increase Physical Activity;Able to understand and use rate of perceived exertion (RPE) scale;Understanding of Exercise Prescription Increase Physical Activity;Increase Strength and Stamina;Understanding of Exercise Prescription      Comments Dyanna is doing well in rehab. She has increased her level on the XR to level 3 and is pushing herself. She is focusing on her oxygen and PLB. She is currently exercising at 3.1 METs. Will continue to monitor and progress as able. Magdelene is doing doing well in rehab. She is walking and using her cardioglide on her off days. She is also doing weights and stretches each day.  She aims for 25-30 min each day.  She is looking forward to seeing her doctor on Wednesday.  He is hoping he will be pleasantly surprised.      Expected Outcomes Short: increase walking speed on treadmill   long: continue to attend rehab Short: Continue to exercise on her off days Long: Conitnue to improve stamina               Discharge Exercise Prescription (Final Exercise Prescription Changes):  Exercise Prescription Changes - 08/10/23 1500       Response to Exercise   Blood Pressure (Admit) 104/66    Blood Pressure (Exit) 102/60    Heart Rate (Admit) 72 bpm    Heart Rate (Exercise) 95 bpm     Heart Rate (Exit) 78 bpm    Oxygen Saturation (Admit) 95 %    Oxygen Saturation (Exercise) 85 %    Oxygen Saturation (Exit) 94 %    Rating of Perceived Exertion (Exercise) 13    Perceived Dyspnea (Exercise) 2    Duration Continue with 30 min of aerobic exercise without signs/symptoms of physical distress.    Intensity THRR unchanged      Progression   Progression Continue to progress workloads to maintain intensity without signs/symptoms of physical distress.      Resistance Training   Training Prescription Yes    Weight 3 lbs             Nutrition:  Target Goals: Understanding of nutrition guidelines, daily intake of sodium 1500mg , cholesterol 200mg , calories 30% from fat and 7% or less from saturated fats, daily to have 5 or more servings of fruits and vegetables.  Biometrics:  Pre Biometrics - 06/04/23 1006       Pre Biometrics   Height 5' 1.5" (1.562 m)    Weight 142 lb 10.2 oz (64.7 kg)    Waist Circumference 33.5 inches    Hip Circumference 38 inches    Waist to Hip Ratio 0.88 %    BMI (Calculated) 26.52    Grip Strength 25.3 kg              Nutrition Therapy  Plan and Nutrition Goals:   Nutrition Assessments:  MEDIFICTS Score Key: >=70 Need to make dietary changes  40-70 Heart Healthy Diet <= 40 Therapeutic Level Cholesterol Diet  Flowsheet Row PULMONARY REHAB CHRONIC OBSTRUCTIVE PULMONARY DISEASE from 06/08/2023 in [facility] RE[facility]  Picture Your Plate Total Score on Admission 13      Picture Your Plate Scores: <16 Unhealthy dietary pattern with much room for improvement. 41-50 Dietary pattern unlikely to meet recommendations for good health and room for improvement. 51-60 More healthful dietary pattern, with some room for improvement.  >60 Healthy dietary pattern, although there may be some specific behaviors that could be improved.    Nutrition Goals Re-Evaluation:  Nutrition Goals Re-Evaluation     Row Name 06/15/23  [phone] 07/06/23 0808 08/03/23 0803         Goals   Nutrition Goal Healthy eating Short term: continue to cut down on chips long term: continue to eat healthy Short: Conitnue to stay away from bad foods and splurge on occassion Long; conitnue to eat healthy     Comment Dnyla has been working on her healthy eating. She has started to cook veggie and has learned she likes peppers. She eats more chicken within the week and it is normally baked or grilled. She stated that she does not eat a lot of sweets. She is happy with her progress in healthy eating. Simren has been working on her diet.  She has been staying away from chips and sweets.  It is starting to pay off as her weight is starting to come down.  She is getting lots of fruits and vegetables.  She is pleased with weight coming off.  She says she is feeling better.  She is staying away from soda and milk and sticking to green tea and water.  She says she feels better overall with her diet now. Ercel continues to work on her diet.  She is staying away from the "bad foods" and sticks with her healthier snacks.  She does have a little debbie on occassion, but trying to stay away.  She has been enjoying the clemintines as a snack.  She contineus to stay away from chips too.     Expected Outcome Short term: continue to cut down on chips   long term: continue to eat healthy Short: Conitnue to stay away from bad foods and splurge on occassion Long; conitnue to eat healthy short: Continue to sway out for healthy snacks Long: Continue to focus on healthy eating habits              Nutrition Goals Discharge (Final Nutrition Goals Re-Evaluation):  Nutrition Goals Re-Evaluation - 08/03/23 0803       Goals   Nutrition Goal Short: Conitnue to stay away from bad foods and splurge on occassion Long; conitnue to eat healthy    Comment Xaria continues to work on her diet.  She is staying away from the "bad foods" and sticks with her healthier snacks.  She  does have a little debbie on occassion, but trying to stay away.  She has been enjoying the clemintines as a snack.  She contineus to stay away from chips too.    Expected Outcome short: Continue to sway out for healthy snacks Long: Continue to focus on healthy eating habits             Psychosocial: Target Goals: Acknowledge presence or absence of significant depression and/or stress, maximize coping skills, provide positive support system.  Participant is able to verbalize types and ability to use techniques and skills needed for reducing stress and depression.  Initial Review & Psychosocial Screening:  Initial Psych Review & Screening - 05/29/23 0936       Initial Review   Current issues with Current Sleep Concerns      Family Dynamics   Good Support System? Yes      Barriers   Psychosocial barriers to participate in program There are no identifiable barriers or psychosocial needs.      Screening Interventions   Interventions Encouraged to exercise;To provide support and resources with identified psychosocial needs;Provide feedback about the scores to participant    Expected Outcomes Short Term goal: Utilizing psychosocial counselor, staff and physician to assist with identification of specific Stressors or current issues interfering with healing process. Setting desired goal for each stressor or current issue identified.;Long Term Goal: Stressors or current issues are controlled or eliminated.;Short Term goal: Identification and review with participant of any Quality of Life or Depression concerns found by scoring the questionnaire.;Long Term goal: The participant improves quality of Life and PHQ9 Scores as seen by post scores and/or verbalization of changes             Quality of Life Scores:  Scores of 19 and below usually indicate a poorer quality of life in these areas.  A difference of  2-3 points is a clinically meaningful difference.  A difference of 2-3 points in the  total score of the Quality of Life Index has been associated with significant improvement in overall quality of life, self-image, physical symptoms, and general health in studies assessing change in quality of life.   PHQ-9: Review Flowsheet  More data exists      08/05/2023 06/04/2023 03/05/2023 02/12/2023 01/23/2023  Depression screen PHQ 2/9  Decreased Interest 0 0 0 0 0  Down, Depressed, Hopeless 0 0 0 0 0  PHQ - 2 Score 0 0 0 0 0  Altered sleeping 1 1 0 - -  Tired, decreased energy 0 0 1 - -  Change in appetite 0 0 0 - -  Feeling bad or failure about yourself  0 0 0 - -  Trouble concentrating 0 0 0 - -  Moving slowly or fidgety/restless 0 0 0 - -  Suicidal thoughts 0 0 0 - -  PHQ-9 Score 1 1 1  - -  Difficult doing work/chores - Not difficult at all Somewhat difficult - -    Details           Interpretation of Total Score  Total Score Depression Severity:  1-4 = Minimal depression, 5-9 = Mild depression, 10-14 = Moderate depression, 15-19 = Moderately severe depression, 20-27 = Severe depression   Psychosocial Evaluation and Intervention:  Psychosocial Evaluation - 05/29/23 0936       Psychosocial Evaluation & Interventions   Interventions Stress management education;Relaxation education;Encouraged to exercise with the program and follow exercise prescription    Comments 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 She lives with her husband of 42 years and they have a son and 2 grandchildren. Her husband and grandchildren are her main support people. She tries to live a very active life but has not been able to do as much as she would like recently due to her advancing COPD. She does walk everyday 30 mins. She denies any depression, anxiety or stress. She does have trouble staying asleep some nights. She recently started taking benadryl and was taking  the generic brand from [facility]. She did not realize she was taking benadryl and ask if this was safe.  I advised her  to speak with her pcp about her concerns about taking benadryl. She has tried melotonin in the past and it did not help. She said she had been reluctant to participate in the program for a long time eventhough Dr. Vassie Loll had recommended it but she saw a video on you-tube about pulmonary rehabilitation and its benefits and decided to give it a try. She does have a $15 dollar copayment and she was not sure if this was going to be a barrier but said she wanted to do as much as she could of the program. No other barriers identified. Her main goals for the program are to get her lungs stronger and be able to do all the activities she wants to do. She wants to be able to help her husband mow the yard. She said she was not able to go out onto the beach with her grandchildren this summer for the first time since they were born, 14 years, due to her SOB and this really brothered her. She would like to get her lungs strong enough to participate in these activities.    Expected Outcomes Short Term: Start and attend the program consistently. Long Term: Get her lung stronger and be able to do all the activities she wants to do.    Continue Psychosocial Services  Follow up required by staff             Psychosocial Re-Evaluation:  Psychosocial Re-Evaluation     Row Name 06/15/23 0806 07/06/23 0811 08/03/23 0801         Psychosocial Re-Evaluation   Current issues with Current Sleep Concerns Current Sleep Concerns Current Sleep Concerns     Comments Andreana said that she was taking an sleep aid she got from [employer] and when talking to her PCP they noticed it was a form of benadryl. She has started to not take anything for sleep. She said she will go to sleep pretty fast but will wake up within 2 hours and her mind will start racing. SHe has no other barriors Anaissa is doing well in rehab.  She had a hard time sleeping last night. She has a hard time calming her mind and staying awake from husband keeping her awake  with his snoring.  Otherwise, she is feeling good mentally and doing well.  She has gotten used to the lack of sleep and adjust each day.  She is generally a positive person and enjoys coming to exercise. Brenlie is doing well in rehab.  She is feeling good mentally.  She is enjoying that she is feeling better and more active again. She does admit to a short fuse and prays about it.  She denies any stressors.  She continues to just get her 3 hrs of sleep with naps in the afternoon. She feels like it has gotten a little better with exercise.  She is enjoying rehab and getting out with her classmates.     Expected Outcomes Shor term: continue to work on sleep    Long term: continue to have no barriors Short: Continue to work on sleep Long: conitnue to stay positive. Short: Continue to exercise for mental boost and sleep Long: Conitnue to focus on positives.     Interventions Stress management education;Relaxation education;Encouraged to attend Pulmonary Rehabilitation for the exercise Stress management education;Relaxation education;Encouraged to attend Pulmonary Rehabilitation for  the exercise Stress management education;Relaxation education;Encouraged to attend Pulmonary Rehabilitation for the exercise     Continue Psychosocial Services  Follow up required by staff Follow up required by staff Follow up required by staff              Psychosocial Discharge (Final Psychosocial Re-Evaluation):  Psychosocial Re-Evaluation - 08/03/23 0801       Psychosocial Re-Evaluation   Current issues with Current Sleep Concerns    Comments Sequoia is doing well in rehab.  She is feeling good mentally.  She is enjoying that she is feeling better and more active again. She does admit to a short fuse and prays about it.  She denies any stressors.  She continues to just get her 3 hrs of sleep with naps in the afternoon. She feels like it has gotten a little better with exercise.  She is enjoying rehab and getting out  with her classmates.    Expected Outcomes Short: Continue to exercise for mental boost and sleep Long: Conitnue to focus on positives.    Interventions Stress management education;Relaxation education;Encouraged to attend Pulmonary Rehabilitation for the exercise    Continue Psychosocial Services  Follow up required by staff              Education: Education Goals: Education classes will be provided on a weekly basis, covering required topics. Participant will state understanding/return demonstration of topics presented.  Learning Barriers/Preferences:  Learning Barriers/Preferences - 05/29/23 0935       Learning Barriers/Preferences   Learning Barriers None    Learning Preferences Audio;Written Material             Education Topics: How Lungs Work and Diseases: - Discuss the anatomy of the lungs and diseases that can affect the lungs, such as COPD. Flowsheet Row PULMONARY REHAB CHRONIC OBSTRUCTIVE PULMONARY DISEASE from 08/19/2023 in [place] [facility]  Date 08/05/23  Educator [facility]  Instruction Review Code 1- Verbalizes Understanding       Exercise: -Discuss the importance of exercise, FITT principles of exercise, normal and abnormal responses to exercise, and how to exercise safely.   Environmental Irritants: -Discuss types of environmental irritants and how to limit exposure to environmental irritants.   Meds/Inhalers and oxygen: - Discuss respiratory medications, definition of an inhaler and oxygen, and the proper way to use an inhaler and oxygen.   Energy Saving Techniques: - Discuss methods to conserve energy and decrease shortness of breath when performing activities of daily living.    Bronchial Hygiene / Breathing Techniques: - Discuss breathing mechanics, pursed-lip breathing technique,  proper posture, effective ways to clear airways, and other functional breathing techniques   Cleaning Equipment: - Provides group verbal and written  instruction about the health risks of elevated stress, cause of high stress, and healthy ways to reduce stress.   Nutrition I: Fats: - Discuss the types of cholesterol, what cholesterol does to the body, and how cholesterol levels can be controlled. Flowsheet Row PULMONARY REHAB CHRONIC OBSTRUCTIVE PULMONARY DISEASE from 08/19/2023 in [place] [facility]  Date 07/08/23  Educator [facility]  Instruction Review Code 1- Verbalizes Understanding       Nutrition II: Labels: -Discuss the different components of food labels and how to read food labels. Flowsheet Row PULMONARY REHAB CHRONIC OBSTRUCTIVE PULMONARY DISEASE from 08/19/2023 in [place] [facility]  Date 07/08/23  Educator [facility]  Instruction Review Code 1- Verbalizes Understanding       Respiratory Infections: - Discuss the signs and symptoms  of respiratory infections, ways to prevent respiratory infections, and the importance of seeking medical treatment when having a respiratory infection.   Stress I: Signs and Symptoms: - Discuss the causes of stress, how stress may lead to anxiety and depression, and ways to limit stress. Flowsheet Row PULMONARY REHAB CHRONIC OBSTRUCTIVE PULMONARY DISEASE from 08/19/2023 in [place] [facility]  Date 07/15/23  Educator [facility]  Instruction Review Code 1- Verbalizes Understanding       Stress II: Relaxation: -Discuss relaxation techniques to limit stress. Flowsheet Row PULMONARY REHAB CHRONIC OBSTRUCTIVE PULMONARY DISEASE from 08/19/2023 in [place] [facility]  Date 07/15/23  Educator [facility]  Instruction Review Code 1- Verbalizes Understanding       Oxygen for Home/Travel: - Discuss how to prepare for travel when on oxygen and proper ways to transport and store oxygen to ensure safety.   Knowledge Questionnaire Score:  Knowledge Questionnaire Score - 06/08/23 0812       Knowledge Questionnaire Score   Pre Score 15/18              Core Components/Risk Factors/Patient Goals at Admission:  Personal Goals and Risk Factors at Admission - 05/29/23 0935       Core Components/Risk Factors/Patient Goals on Admission    Weight Management Weight Maintenance    Improve shortness of breath with ADL's Yes    Intervention Provide education, individualized exercise plan and daily activity instruction to help decrease symptoms of SOB with activities of daily living.    Expected Outcomes Short Term: Improve cardiorespiratory fitness to achieve a reduction of symptoms when performing ADLs;Long Term: Be able to perform more ADLs without symptoms or delay the onset of symptoms    Increase knowledge of respiratory medications and ability to use respiratory devices properly  Yes    Intervention Provide education and demonstration as needed of appropriate use of medications, inhalers, and oxygen therapy.    Expected Outcomes Short Term: Achieves understanding of medications use. Understands that oxygen is a medication prescribed by physician. Demonstrates appropriate use of inhaler and oxygen therapy.;Long Term: Maintain appropriate use of medications, inhalers, and oxygen therapy.    Hypertension Yes    Intervention Provide education on lifestyle modifcations including regular physical activity/exercise, weight management, moderate sodium restriction and increased consumption of fresh fruit, vegetables, and low fat dairy, alcohol moderation, and smoking cessation.;Monitor prescription use compliance.    Expected Outcomes Short Term: Continued assessment and intervention until BP is < 140/67mm HG in hypertensive participants. < 130/31mm HG in hypertensive participants with diabetes, heart failure or chronic kidney disease.;Long Term: Maintenance of blood pressure at goal levels.    Lipids Yes    Intervention Provide education and support for participant on nutrition & aerobic/resistive exercise along with prescribed medications to achieve LDL  70mg , HDL >40mg .    Expected Outcomes Short Term: Participant states understanding of desired cholesterol values and is compliant with medications prescribed. Participant is following exercise prescription and nutrition guidelines.;Long Term: Cholesterol controlled with medications as prescribed, with individualized exercise RX and with personalized nutrition plan. Value goals: LDL < 70mg , HDL > 40 mg.             Core Components/Risk Factors/Patient Goals Review:   Goals and Risk Factor Review     Row Name 06/15/23 1610 07/06/23 0806 08/03/23 0805         Core Components/Risk Factors/Patient Goals Review   Personal Goals Review Improve shortness of breath with ADL's;Weight Management/Obesity Improve shortness of breath with ADL's;Weight  Management/Obesity;Increase knowledge of respiratory medications and ability to use respiratory devices properly. Improve shortness of breath with ADL's;Weight Management/Obesity;Increase knowledge of respiratory medications and ability to use respiratory devices properly.;Hypertension     Review Jatasia is doing good with exercise. She is working on [employer] with exercising by walking everyday and eating healthy. She stated that she wants to keep around the same weight she is and does not want to gain. She is also focusing on PLB to help improve her SOB. She has noticed she breaths through her mouth a lot and is trying to remeber PLB. She stated that she has felt better since exercising. Winogene is doing well in rehab.  She feels like her breathing is getting better overall.  She is able to do more with less SOB.  She does still see her saturations dropping but they do recover with PLB.  She is doing well on her meds.  She was excited about her weight today.  She was under 140 lb today!!  She is doing better with her diet and it is finally starting to pay off. Kelsay is doing well in rehab.  Her weight continues to come down.  She is pleased that it is  showing on scale.  She goes to doctor on Wednesday and eager to show off her progress in rehab.  Her breathing has gotten better but her saturations are still on the lower side.  She is doing well on her meds.  Her blood pressures have been good.  She is going to take report with her to her appt.     Expected Outcomes Short term: comtinue to focuse of PLB and weight managment   long term: continue to enjoy exercising and walking at home Short: Conitnue to work on breathing Long: Conitnue to work on [employer] loss Short: Take her report to doctor on Wednesday Long: Continue to work on breathing              Core Components/Risk Factors/Patient Goals at Discharge (Final Review):   Goals and Risk Factor Review - 08/03/23 0805       Core Components/Risk Factors/Patient Goals Review   Personal Goals Review Improve shortness of breath with ADL's;Weight Management/Obesity;Increase knowledge of respiratory medications and ability to use respiratory devices properly.;Hypertension    Review Thessaly is doing well in rehab.  Her weight continues to come down.  She is pleased that it is showing on scale.  She goes to doctor on Wednesday and eager to show off her progress in rehab.  Her breathing has gotten better but her saturations are still on the lower side.  She is doing well on her meds.  Her blood pressures have been good.  She is going to take report with her to her appt.    Expected Outcomes Short: Take her report to doctor on Wednesday Long: Continue to work on breathing             ITP Comments:  ITP Comments     Row Name 06/08/23 0759 07/01/23 0643 07/29/23 0647 08/26/23 0810     ITP Comments First full day of exercise!  Patient was oriented to gym and equipment including functions, settings, policies, and procedures.  Patient's individual exercise prescription and treatment plan were reviewed.  All starting workloads were established based on the results of the 6 minute walk test done at  initial orientation visit.  The plan for exercise progression was also introduced and progression will be customized based on patient's  performance and goals 30 day review completed. ITP sent to Dr.Jehanzeb Memon, Medical Director of  Pulmonary Rehab. Continue with ITP unless changes are made by physician. 30 day review completed. ITP sent to Dr.Jehanzeb Memon, Medical Director of  Pulmonary Rehab. Continue with ITP unless changes are made by physician. 30 day review completed. ITP sent to Dr.Jehanzeb Memon, Medical Director of  Pulmonary Rehab. Continue with ITP unless changes are made by physician.             Comments: 30 day review

## 2023-08-31 ENCOUNTER — Encounter (HOSPITAL_COMMUNITY)
Admission: RE | Admit: 2023-08-31 | Discharge: 2023-08-31 | Disposition: A | Discharge status: Home / Self Care | Payer: Medicare Other | Source: Ambulatory Visit | Attending: Pulmonary Disease | Admitting: Pulmonary Disease

## 2023-08-31 DIAGNOSIS — J4489 Other specified chronic obstructive pulmonary disease: Secondary | ICD-10-CM | POA: Diagnosis not present

## 2023-08-31 NOTE — Progress Notes (Signed)
Daily Session Note  Patient Details  Name: [name] MRN: [id number] Date of Birth: 1961-01-11 Referring Provider:   Flowsheet Row PULMONARY REHAB COPD ORIENTATION from 06/04/2023 in [facility] CARDIAC REHABILITATION  Referring Provider Dr. Vassie Loll       Encounter Date: 08/31/2023  Check In:  Session Check In - 08/31/23 0745       Check-In   Supervising physician immediately available to respond to emergencies See telemetry face sheet for immediately available MD    Location [facility]    Staff Present Ross Ludwig, BS, Exercise Physiologist    Staff Present Hulen Luster, BS, RRT, CPFT    Virtual Visit No    Medication changes reported     No    Fall or balance concerns reported    No    Tobacco Cessation No Change    Warm-up and Cool-down Performed on first and last piece of equipment    Resistance Training Performed Yes    VAD Patient? No    PAD/SET Patient? No      Pain Assessment   Currently in Pain? No/denies    Multiple Pain Sites No             Capillary Blood Glucose: No results found for this or any previous visit (from the past 24 hour(s)).    Social History   Tobacco Use  Smoking Status Former   Current packs/day: 0.00   Average packs/day: 3.0 packs/day for 33.0 years (99.0 ttl pk-yrs)   Types: Cigarettes   Start date: 10/27/1972   Quit date: 2007   Years since quitting: 17.9  Smokeless Tobacco Never    Goals Met:  Independence with exercise equipment Exercise tolerated well No report of concerns or symptoms today Strength training completed today  Goals Unmet:  Not Applicable  Comments: Pt able to follow exercise prescription today without complaint.  Will continue to monitor for progression.

## 2023-09-02 ENCOUNTER — Encounter (HOSPITAL_COMMUNITY)
Admission: RE | Admit: 2023-09-02 | Discharge: 2023-09-02 | Disposition: A | Discharge status: Home / Self Care | Payer: Medicare Other | Source: Ambulatory Visit | Attending: Pulmonary Disease | Admitting: Pulmonary Disease

## 2023-09-02 DIAGNOSIS — J4489 Other specified chronic obstructive pulmonary disease: Secondary | ICD-10-CM

## 2023-09-02 NOTE — Progress Notes (Signed)
Daily Session Note  Patient Details  Name: [name] MRN: [id number] Date of Birth: 1960-10-09 Referring Provider:   Flowsheet Row PULMONARY REHAB COPD ORIENTATION from 06/04/2023 in [facility] CARDIAC REHABILITATION  Referring Provider Dr. Vassie Loll       Encounter Date: 09/02/2023  Check In:  Session Check In - 09/02/23 0813       Check-In   Supervising physician immediately available to respond to emergencies See telemetry face sheet for immediately available MD    Location [facility]    Staff Present Ross Ludwig, BS, Exercise Physiologist;Debra Laural Benes, RN, BSN;Other   Sabino Dick NT   Virtual Visit No    Medication changes reported     No    Fall or balance concerns reported    No    Warm-up and Cool-down Performed on first and last piece of equipment    Resistance Training Performed Yes    VAD Patient? No    PAD/SET Patient? No      Pain Assessment   Currently in Pain? No/denies             Capillary Blood Glucose: No results found for this or any previous visit (from the past 24 hour(s)).    Social History   Tobacco Use  Smoking Status Former   Current packs/day: 0.00   Average packs/day: 3.0 packs/day for 33.0 years (99.0 ttl pk-yrs)   Types: Cigarettes   Start date: 10/27/1972   Quit date: 2007   Years since quitting: 17.9  Smokeless Tobacco Never    Goals Met:  Proper associated with RPD/PD & O2 Sat Independence with exercise equipment Using PLB without cueing & demonstrates good technique Exercise tolerated well Personal goals reviewed No report of concerns or symptoms today Strength training completed today  Goals Unmet:  Not Applicable  Comments: Pt able to follow exercise prescription today without complaint.  Will continue to monitor for progression.

## 2023-09-03 ENCOUNTER — Ambulatory Visit (INDEPENDENT_AMBULATORY_CARE_PROVIDER_SITE_OTHER): Payer: Medicare Other | Admitting: Family Medicine

## 2023-09-03 VITALS — BP 136/76 | HR 75 | Temp 97.7°F | Ht 61.0 in | Wt 136.0 lb

## 2023-09-03 DIAGNOSIS — J441 Chronic obstructive pulmonary disease with (acute) exacerbation: Secondary | ICD-10-CM

## 2023-09-03 MED ORDER — AMOXICILLIN-POT CLAVULANATE 875-125 MG PO TABS: 1.0000 | ORAL_TABLET | Freq: Two times a day (BID) | ORAL | 0 refills | Status: DC

## 2023-09-03 MED ORDER — PREDNISONE 20 MG PO TABS: ORAL_TABLET | ORAL | 0 refills | Status: DC

## 2023-09-04 NOTE — Progress Notes (Signed)
   Subjective:    Patient ID: [name], female    DOB: July 16, 1961, 62 y.o.   MRN: [id number]   History of Present Illness   The patient, with a history of COPD, began experiencing chest congestion and stuffiness a week ago. She self-initiated Mucinex, which was followed by the onset of a sore throat the next day. The patient noted white patches in the throat and started gargling with salt water. Over the weekend, she added Delsym to her regimen, which seemed to improve the throat symptoms. However, she began coughing up yellow phlegm today. The patient denies wheezing, chest tightness, and fever, but reports a sensation of heat in the chest. She does not feel more short of breath than usual, but notes that deep breaths exacerbate the sensation.  The patient has been attending physical therapy and engaging in regular exercise, including walking, which she reports is going well. She has not needed to use her albuterol rescue inhaler more frequently, only resorting to it when she feels she can't catch her breath. She has also taken a couple of breathing treatments in the past week. The phlegm she is coughing up has transitioned from clear to yellow.  The patient is cautious about exposure to cold temperatures, as it seems to exacerbate her symptoms. She has been using a scarf to warm the air she breathes when outside in cold weather. She has also been proactive in seeking medical attention, not wanting to wait until her symptoms worsen.         Review of Systems     Objective:    Physical Exam   HEENT: Pharynx without erythema or exudates. CHEST: Clear to auscultation. CARDIOVASCULAR: Normal heart sounds.           Assessment & Plan:  Assessment and Plan    COPD Flare New onset of chest congestion, sore throat, and yellow sputum production. No increased shortness of breath or wheezing. No fever. Physical exam does not suggest pneumonia. -Start a short course of  Prednisone. -Start antibiotics (name and dose not specified in conversation). -Continue current inhaler regimen and physical therapy exercises. -Check back if symptoms worsen or do not improve.

## 2023-09-07 ENCOUNTER — Encounter (HOSPITAL_COMMUNITY)
Admission: RE | Admit: 2023-09-07 | Discharge: 2023-09-07 | Disposition: A | Discharge status: Home / Self Care | Payer: Medicare Other | Source: Ambulatory Visit | Attending: Pulmonary Disease

## 2023-09-07 DIAGNOSIS — J4489 Other specified chronic obstructive pulmonary disease: Secondary | ICD-10-CM

## 2023-09-07 NOTE — Progress Notes (Signed)
Daily Session Note  Patient Details  Name: [name] MRN: [id number] Date of Birth: 1961/08/31 Referring Provider:   Flowsheet Row PULMONARY REHAB COPD ORIENTATION from 06/04/2023 in [facility] CARDIAC REHABIL[facility]ATION  Referring Provider Dr. Vassie Loll       Encounter Date: 09/07/2023  Check In:  Session Check In - 09/07/23 0745       Check-In   Supervising physician immediately available to respond to emergencies See telemetry face sheet for immediately available MD    Location [facility]    Staff Present Ross Ludwig, BS, Exercise Physiologist    Staff Present Hulen Luster, BS, RRT, CPFT    Virtual Visit No    Medication changes reported     No    Fall or balance concerns reported    No    Tobacco Cessation No Change    Warm-up and Cool-down Performed on first and last piece of equipment    Resistance Training Performed Yes    VAD Patient? No    PAD/SET Patient? No      Pain Assessment   Currently in Pain? No/denies    Multiple Pain Sites No             Capillary Blood Glucose: No results found for this or any previous visit (from the past 24 hours).    Social History   Tobacco Use  Smoking Status Former   Current packs/day: 0.00   Average packs/day: 3.0 packs/day for 33.0 years (99.0 ttl pk-yrs)   Types: Cigarettes   Start date: 10/27/1972   Quit date: 2007   Years since quitting: 17.9  Smokeless Tobacco Never    Goals Met:  Independence with exercise equipment Exercise tolerated well No report of concerns or symptoms today Strength training completed today  Goals Unmet:  Not Applicable  Comments: Pt able to follow exercise prescription today without complaint.  Will continue to monitor for progression.

## 2023-09-09 ENCOUNTER — Encounter (HOSPITAL_COMMUNITY)
Admission: RE | Admit: 2023-09-09 | Discharge: 2023-09-09 | Disposition: A | Discharge status: Home / Self Care | Payer: Medicare Other | Source: Ambulatory Visit | Attending: Pulmonary Disease | Admitting: Pulmonary Disease

## 2023-09-09 DIAGNOSIS — J4489 Other specified chronic obstructive pulmonary disease: Secondary | ICD-10-CM

## 2023-09-09 NOTE — Progress Notes (Signed)
Daily Session Note  Patient Details  Name: [name] MRN: [id number] Date of Birth: 1961-03-29 Referring Provider:   Flowsheet Row PULMONARY REHAB COPD ORIENTATION from 06/04/2023 in [facility] CARDIAC REHABILITATION  Referring Provider Dr. Vassie Loll       Encounter Date: 09/09/2023  Check In:  Session Check In - 09/09/23 0745       Check-In   Supervising physician immediately available to respond to emergencies See telemetry face sheet for immediately available MD    Location [facility]    Staff Present Ross Ludwig, BS, Exercise Physiologist;Other   Earnest Conroy   Virtual Visit No    Medication changes reported     No    Fall or balance concerns reported    No    Tobacco Cessation No Change    Warm-up and Cool-down Performed on first and last piece of equipment    Resistance Training Performed Yes    VAD Patient? No    PAD/SET Patient? No      Pain Assessment   Currently in Pain? No/denies    Multiple Pain Sites No             Capillary Blood Glucose: No results found for this or any previous visit (from the past 24 hours).    Social History   Tobacco Use  Smoking Status Former   Current packs/day: 0.00   Average packs/day: 3.0 packs/day for 33.0 years (99.0 ttl pk-yrs)   Types: Cigarettes   Start date: 10/27/1972   Quit date: 2007   Years since quitting: 17.9  Smokeless Tobacco Never    Goals Met:  Independence with exercise equipment Using PLB without cueing & demonstrates good technique Exercise tolerated well No report of concerns or symptoms today Strength training completed today  Goals Unmet:  Not Applicable  Comments: Pt able to follow exercise prescription today without complaint.  Will continue to monitor for progression.

## 2023-09-14 ENCOUNTER — Encounter (HOSPITAL_COMMUNITY): Admission: RE | Admit: 2023-09-14 | Payer: Medicare Other | Source: Ambulatory Visit

## 2023-09-21 ENCOUNTER — Encounter (HOSPITAL_COMMUNITY)
Admission: RE | Admit: 2023-09-21 | Discharge: 2023-09-21 | Disposition: A | Discharge status: Home / Self Care | Payer: Medicare Other | Source: Ambulatory Visit | Attending: Pulmonary Disease

## 2023-09-21 DIAGNOSIS — J4489 Other specified chronic obstructive pulmonary disease: Secondary | ICD-10-CM | POA: Diagnosis not present

## 2023-09-21 NOTE — Progress Notes (Signed)
Daily Session Note  Patient Details  Name: MONIQUA WELTZIN MRN: [id number] Date of Birth: [birth year] Referring Provider:   Flowsheet Row PULMONARY REHAB COPD ORIENTATION from 06/04/2023 in [facility] CARDIAC REHABILITATION  Referring Provider Dr. Vassie Loll       Encounter Date: 09/21/2023  Check In:  Session Check In - 09/21/23 0745       Check-In   Supervising physician immediately available to respond to emergencies See telemetry face sheet for immediately available MD    Location [facility]    Staff Present Ross Ludwig, BS, Exercise Physiologist;Phyllis Billingsley, RN;Other   Sherrye Payor, RN   Virtual Visit No    Medication changes reported     No    Fall or balance concerns reported    No    Warm-up and Cool-down Performed on first and last piece of equipment    Resistance Training Performed Yes    VAD Patient? No    PAD/SET Patient? No      Pain Assessment   Currently in Pain? No/denies    Multiple Pain Sites No             Capillary Blood Glucose: No results found for this or any previous visit (from the past 24 hours).    Social History   Tobacco Use  Smoking Status Former   Current packs/day: 0.00   Average packs/day: 3.0 packs/day for 33.0 years (99.0 ttl pk-yrs)   Types: Cigarettes   Start date: 10/27/1972   Quit date: 2007   Years since quitting: 18.0  Smokeless Tobacco Never    Goals Met:  Independence with exercise equipment Exercise tolerated well No report of concerns or symptoms today Strength training completed today  Goals Unmet:  Not Applicable  Comments: Pt able to follow exercise prescription today without complaint.  Will continue to monitor for progression.

## 2023-09-22 ENCOUNTER — Encounter (HOSPITAL_COMMUNITY): Payer: Self-pay | Admitting: *Deleted

## 2023-09-22 DIAGNOSIS — J4489 Other specified chronic obstructive pulmonary disease: Secondary | ICD-10-CM

## 2023-09-22 NOTE — Progress Notes (Signed)
 Pulmonary Individual Treatment Plan  Patient Details  Name: ROVENA HEARLD MRN: [id number] Date of Birth: 08-12-1961 Referring Provider:   Flowsheet Row PULMONARY REHAB COPD ORIENTATION from 06/04/2023 in [facility] CARDIAC REHABILITATION  Referring Provider Dr. [name]       Initial Encounter Date:  Flowsheet Row PULMONARY REHAB COPD ORIENTATION from 06/04/2023 in [facility]  Date 06/04/23       Visit Diagnosis: COPD with chronic bronchitis (HCC)  Patient's Home Medications on Admission:   Current Outpatient Medications:    albuterol  (PROVENTIL ) (2.5 MG/3ML) 0.083% nebulizer solution, USE 1 VIAL IN NEBULIZER EVERY 4 HOURS AS NEEDED FOR SHORTNESS OF BREATH, Disp: 360 mL, Rfl: 0   albuterol  (VENTOLIN  HFA) 108 (90 Base) MCG/ACT inhaler, Inhale 2 puffs into the lungs every 4 (four) hours as needed for wheezing or shortness of breath., Disp: 8 g, Rfl: 5   amoxicillin -clavulanate (AUGMENTIN ) 875-125 MG tablet, Take 1 tablet by mouth 2 (two) times daily., Disp: 14 tablet, Rfl: 0   aspirin 81 MG EC tablet, Take 81 mg by mouth at bedtime., Disp: , Rfl:    Cholecalciferol (VITAMIN D3) 400 units CAPS, Take 400 Units by mouth at bedtime. , Disp: , Rfl:    ezetimibe  (ZETIA ) 10 MG tablet, Take 1 tablet (10 mg total) by mouth daily., Disp: 90 tablet, Rfl: 3   Fluticasone -Umeclidin-Vilant (TRELEGY ELLIPTA ) 100-62.5-25 MCG/ACT AEPB, Inhale 1 puff into the lungs daily., Disp: 1 each, Rfl: 11   levothyroxine  (SYNTHROID ) 125 MCG tablet, 1 qam all days except Mondays  use 1/2 in the am., Disp: 90 tablet, Rfl: 1   metoprolol  succinate (TOPROL -XL) 25 MG 24 hr tablet, 1/2 qd, Disp: 90 tablet, Rfl: 3   Multiple Vitamin (MULTIVITAMIN) tablet, Take 1 tablet by mouth daily., Disp: , Rfl:    nitroGLYCERIN  (NITROSTAT ) 0.4 MG SL tablet, Place 1 tablet (0.4 mg total) under the tongue every 5 (five) minutes x 3 doses as needed for chest pain (if no relief after 3rd dose, proceed to ED or call  911)., Disp: 25 tablet, Rfl: 3   pantoprazole  (PROTONIX ) 40 MG tablet, Take 1 tablet (40 mg total) by mouth daily., Disp: 90 tablet, Rfl: 1   predniSONE  (DELTASONE ) 20 MG tablet, 2 every day for 5 days, Disp: 10 tablet, Rfl: 0   rosuvastatin  (CRESTOR ) 40 MG tablet, Take 1 tablet (40 mg total) by mouth daily., Disp: 90 tablet, Rfl: 1   vitamin C (ASCORBIC ACID) 500 MG tablet, Take 500 mg by mouth daily., Disp: , Rfl:  No current facility-administered medications for this visit.  Facility-Administered Medications Ordered in Other Visits:    [facility] dose#1 phenylephrine  1%/ketorolac  0.3% (OMIDRIA ) in BSS 500 ML (MUST DILUTE PRIOR TO USE) [employer], , , PRN, Juli Blunt, MD, 500 mL at 11/14/22 1054  Past Medical History: Past Medical History:  Diagnosis Date   COPD (chronic obstructive pulmonary disease) (HCC)    Coronary atherosclerosis of native coronary artery    DES RCA 5/09, nonobstructive left system   Essential hypertension    Hypothyroidism    Mixed hyperlipidemia    NSTEMI (non-ST elevated myocardial infarction) (HCC)    2009    Tobacco Use: Social History   Tobacco Use  Smoking Status Former   Current packs/day: 0.00   Average packs/day: 3.0 packs/day for 33.0 years (99.0 ttl pk-yrs)   Types: Cigarettes   Start date: 10/27/1972   Quit date: 2007   Years since quitting: 18.0  Smokeless Tobacco Never  Labs: Review Flowsheet  More data exists      Latest Ref Rng & Units 07/29/2021 01/01/2022 07/03/2022 01/19/2023 07/27/2023  Labs for ITP Cardiac and Pulmonary Rehab  Cholestrol 100 - 199 mg/dL 829  852  832  840  854   LDL (calc) 0 - 99 mg/dL 94  70  90  83  71   HDL-C >39 mg/dL 49  57  52  53  53   Trlycerides 0 - 149 mg/dL 845  885  857  868  882     Capillary Blood Glucose: Lab Results  Component Value Date   GLUCAP 97 06/21/2018     Pulmonary Assessment Scores:  Pulmonary Assessment Scores     Row Name 06/08/23 0811         ADL UCSD   ADL Phase  Entry     SOB Score total 54     Rest 0     Walk 3     Stairs 3     Bath 1     Dress 1     Shop 3       CAT Score   CAT Score 18       mMRC Score   mMRC Score 3             UCSD: Self-administered rating of dyspnea associated with activities of daily living (ADLs) 6-point scale (0 = not at all to 5 = maximal or unable to do because of breathlessness)  Scoring Scores range from 0 to 120.  Minimally important difference is 5 units  CAT: CAT can identify the health impairment of COPD patients and is better correlated with disease progression.  CAT has a scoring range of zero to 40. The CAT score is classified into four groups of low (less than 10), medium (10 - 20), high (21-30) and very high (31-40) based on the impact level of disease on health status. A CAT score over 10 suggests significant symptoms.  A worsening CAT score could be explained by an exacerbation, poor medication adherence, poor inhaler technique, or progression of COPD or comorbid conditions.  CAT MCID is 2 points  mMRC: mMRC (Modified Medical Research Council) Dyspnea Scale is used to assess the degree of baseline functional disability in patients of respiratory disease due to dyspnea. No minimal important difference is established. A decrease in score of 1 point or greater is considered a positive change.   Pulmonary Function Assessment:   Exercise Target Goals: Exercise Program Goal: Individual exercise prescription set using results from initial 6 min walk test and THRR while considering  patient's activity barriers and safety.   Exercise Prescription Goal: Initial exercise prescription builds to 30-45 minutes a day of aerobic activity, 2-3 days per week.  Home exercise guidelines will be given to patient during program as part of exercise prescription that the participant will acknowledge.  Activity Barriers & Risk Stratification:  Activity Barriers & Cardiac Risk Stratification - 05/29/23 0907        Activity Barriers & Cardiac Risk Stratification   Activity Barriers Shortness of Breath;Deconditioning    Cardiac Risk Stratification Moderate             6 Minute Walk:  6 Minute Walk     Row Name 06/04/23 1003         6 Minute Walk   Phase Initial     Distance 1060 feet     Walk Time 6 minutes     #  of Rest Breaks 0     MPH 2.01     METS 2.66     RPE 11     Perceived Dyspnea  1     VO2 Peak 9.31     Symptoms No     Resting HR 67 bpm     Resting BP 110/60     Resting Oxygen Saturation  94 %     Exercise Oxygen Saturation  during 6 min walk 85 %     Max Ex. HR 90 bpm     Max Ex. BP 132/60     2 Minute Post BP 120/60       Interval HR   1 Minute HR 106     2 Minute HR 93     3 Minute HR 88     4 Minute HR 90     5 Minute HR 88     6 Minute HR 89     2 Minute Post HR 75     Interval Heart Rate? Yes       Interval Oxygen   Interval Oxygen? Yes     Baseline Oxygen Saturation % 94 %     1 Minute Oxygen Saturation % 88 %     1 Minute Liters of Oxygen 0 L     2 Minute Oxygen Saturation % 86 %     2 Minute Liters of Oxygen 0 L     3 Minute Oxygen Saturation % 86 %     3 Minute Liters of Oxygen 0 L     4 Minute Oxygen Saturation % 85 %     4 Minute Liters of Oxygen 0 L     5 Minute Oxygen Saturation % 86 %     5 Minute Liters of Oxygen 0 L     6 Minute Oxygen Saturation % 87 %     6 Minute Liters of Oxygen 0 L     2 Minute Post Oxygen Saturation % 94 %     2 Minute Post Liters of Oxygen 0 L              Oxygen Initial Assessment:   Oxygen Re-Evaluation:  Oxygen Re-Evaluation     Row Name 06/08/23 0802 06/15/23 0819 07/06/23 0813 08/03/23 0807 09/02/23 0834     Program Oxygen Prescription   Program Oxygen Prescription -- None None None None     Home Oxygen   Home Oxygen Device -- None None None None   Sleep Oxygen Prescription -- None None None None   Home Exercise Oxygen Prescription -- None None None None   Home Resting Oxygen  Prescription -- None None None None     Goals/Expected Outcomes   Short Term Goals -- -- To learn and understand importance of monitoring SPO2 with pulse oximeter and demonstrate accurate use of the pulse oximeter.;To learn and understand importance of maintaining oxygen saturations>88%;To learn and demonstrate proper pursed lip breathing techniques or other breathing techniques. ;To learn and demonstrate proper use of respiratory medications To learn and understand importance of monitoring SPO2 with pulse oximeter and demonstrate accurate use of the pulse oximeter.;To learn and understand importance of maintaining oxygen saturations>88%;To learn and demonstrate proper pursed lip breathing techniques or other breathing techniques. ;To learn and demonstrate proper use of respiratory medications To learn and understand importance of monitoring SPO2 with pulse oximeter and demonstrate accurate use of the pulse oximeter.;To learn and understand importance of maintaining oxygen saturations>88%;To learn  and demonstrate proper pursed lip breathing techniques or other breathing techniques. ;To learn and demonstrate proper use of respiratory medications   Long  Term Goals -- -- Verbalizes importance of monitoring SPO2 with pulse oximeter and return demonstration;Maintenance of O2 saturations>88%;Compliance with respiratory medication;Exhibits proper breathing techniques, such as pursed lip breathing or other method taught during program session Verbalizes importance of monitoring SPO2 with pulse oximeter and return demonstration;Maintenance of O2 saturations>88%;Compliance with respiratory medication;Exhibits proper breathing techniques, such as pursed lip breathing or other method taught during program session Verbalizes importance of monitoring SPO2 with pulse oximeter and return demonstration;Maintenance of O2 saturations>88%;Compliance with respiratory medication;Exhibits proper breathing techniques, such as pursed  lip breathing or other method taught during program session   Comments Reviewed PLB technique with pt.  Talked about how it works and it's importance in maintaining their exercise saturations. Chelsa is doing well in the program so far. Her breathing is staying WNL when exrcising 88-90s. She feels her breathing improving each week and her energy increaing as well. She does breath through her mouth and is trying PLB. Maisie is doing well in rehab.  Her breathing is doing better and she is feelng better overall.  Her sats conitnue to drop some with exercise but she does not feel too winded. She is good about using her PLB to get it to recover.  Her family has also noticed a difference in her breathing too. Saia is doing well in rehab. Her breathing is getting better, but her saturations continue to be on the lower side.  She is going to doctor on Wednesday and willl take her report with her that day.  She will talk to them about her numbers.  She has been using her PLB to help with breathing.  She is pleased with her improvement and progress. Justines oxygen does continue to drop during exreicse and talking to the 85-86% It does increase when she does PLB and takes deep breaths. She stated that she feels like her breathing has improved.   Goals/Expected Outcomes Short: Become more profiecient at using PLB. Long: Become independent at using PLB. Short : continue to foucse on PLB when exercign    long: continue to monitor breathing and oxygen stats when exercising Short: Conitnue to use to help with PLB.  Long: Continue to monitor saturations closely. Short: Take saturations to doctor on Wednesday Long; Continue to improve breathing. Short: monitor oxygen stats   long term: contiue to work on PLB when exercising            Oxygen Discharge (Final Oxygen Re-Evaluation):  Oxygen Re-Evaluation - 09/02/23 0834       Program Oxygen Prescription   Program Oxygen Prescription None      Home Oxygen   Home  Oxygen Device None    Sleep Oxygen Prescription None    Home Exercise Oxygen Prescription None    Home Resting Oxygen Prescription None      Goals/Expected Outcomes   Short Term Goals To learn and understand importance of monitoring SPO2 with pulse oximeter and demonstrate accurate use of the pulse oximeter.;To learn and understand importance of maintaining oxygen saturations>88%;To learn and demonstrate proper pursed lip breathing techniques or other breathing techniques. ;To learn and demonstrate proper use of respiratory medications    Long  Term Goals Verbalizes importance of monitoring SPO2 with pulse oximeter and return demonstration;Maintenance of O2 saturations>88%;Compliance with respiratory medication;Exhibits proper breathing techniques, such as pursed lip breathing or other method taught during program session  Comments Justines oxygen does continue to drop during exreicse and talking to the 85-86% It does increase when she does PLB and takes deep breaths. She stated that she feels like her breathing has improved.    Goals/Expected Outcomes Short: monitor oxygen stats   long term: contiue to work on PLB when exercising             Initial Exercise Prescription:  Initial Exercise Prescription - 06/04/23 1000       Date of Initial Exercise RX and Referring Provider   Date 06/04/23    Referring Provider Dr. [name]      Oxygen   Maintain Oxygen Saturation 88% or higher      Treadmill   MPH 2    Grade 0    Minutes 15      Recumbant Elliptical   Level 1    RPM 60    Minutes 15      Prescription Details   Frequency (times per week) 2    Duration Progress to 30 minutes of continuous aerobic without signs/symptoms of physical distress      Intensity   THRR 40-80% of Max Heartrate 103-138    Ratings of Perceived Exertion 11-13    Perceived Dyspnea 0-4      Resistance Training   Training Prescription Yes    Weight 3    Reps 10-15             Perform  Capillary Blood Glucose checks as needed.  Exercise Prescription Changes:   Exercise Prescription Changes     Row Name 06/15/23 1300 07/01/23 1300 07/06/23 0800 07/13/23 1200 07/27/23 1200     Response to Exercise   Blood Pressure (Admit) 118/70 122/68 -- 124/64 118/60   Blood Pressure (Exercise) 126/70 132/70 -- -- --   Blood Pressure (Exit) 100/70 112/64 -- 110/60 110/60   Heart Rate (Admit) 73 bpm 70 bpm -- 71 bpm 67 bpm   Heart Rate (Exercise) 85 bpm 93 bpm -- 90 bpm 90 bpm   Heart Rate (Exit) 75 bpm 81 bpm -- 74 bpm 79 bpm   Oxygen Saturation (Admit) 95 % 93 % -- 91 % 92 %   Oxygen Saturation (Exercise) 88 % 90 % -- 88 % 88 %   Oxygen Saturation (Exit) 94 % 91 % -- 94 % 94 %   Rating of Perceived Exertion (Exercise) 12 12 -- 12 12   Perceived Dyspnea (Exercise) 1 1 -- 2 2   Duration Continue with 30 min of aerobic exercise without signs/symptoms of physical distress. Continue with 30 min of aerobic exercise without signs/symptoms of physical distress. -- Continue with 30 min of aerobic exercise without signs/symptoms of physical distress. Continue with 30 min of aerobic exercise without signs/symptoms of physical distress.   Intensity THRR unchanged THRR unchanged -- THRR unchanged THRR unchanged     Progression   Progression Continue to progress workloads to maintain intensity without signs/symptoms of physical distress. Continue to progress workloads to maintain intensity without signs/symptoms of physical distress. -- Continue to progress workloads to maintain intensity without signs/symptoms of physical distress. Continue to progress workloads to maintain intensity without signs/symptoms of physical distress.     Resistance Training   Training Prescription Yes Yes -- Yes Yes   Weight 3 lbs 3 lbs -- 3 lbs / blue band 3 lbs / blue band   Reps 10-15 10-15 -- 10-15 10-15   Time 1 Minutes -- -- -- --  Treadmill   MPH 2 2.2 -- 2 1.7   Grade 0 0.5 -- 0.5 0   Minutes 15 15 --  15 15   METs 2.53 2.84 -- 2.67 2.3     Recumbant Elliptical   Level 3 3 -- 3 3   RPM 47 38 -- 45 47   Minutes 15 15 -- 15 15   METs 2.3 2.5 -- 3.1 3.1     Home Exercise Plan   Plans to continue exercise at -- -- Home (comment)  walking, [employer] Home (comment) Home (comment)   Frequency -- -- Add 2 additional days to program exercise sessions. Add 2 additional days to program exercise sessions. Add 2 additional days to program exercise sessions.   Initial Home Exercises Provided -- -- 07/06/23 -- --     Oxygen   Maintain Oxygen Saturation 88% or higher 88% or higher -- 88% or higher 88% or higher    Row Name 08/10/23 1500 09/07/23 1300 09/21/23 1200         Response to Exercise   Blood Pressure (Admit) 104/66 112/68 120/60     Blood Pressure (Exit) 102/60 120/60 128/70     Heart Rate (Admit) 72 bpm 66 bpm 64 bpm     Heart Rate (Exercise) 95 bpm 88 bpm 93 bpm     Heart Rate (Exit) 78 bpm 73 bpm 70 bpm     Oxygen Saturation (Admit) 95 % 94 % 94 %     Oxygen Saturation (Exercise) 85 % 88 % 88 %     Oxygen Saturation (Exit) 94 % 95 % 93 %     Rating of Perceived Exertion (Exercise) 13 12 12      Perceived Dyspnea (Exercise) 2 1 2      Duration Continue with 30 min of aerobic exercise without signs/symptoms of physical distress. Continue with 30 min of aerobic exercise without signs/symptoms of physical distress. Continue with 30 min of aerobic exercise without signs/symptoms of physical distress.     Intensity THRR unchanged THRR unchanged THRR unchanged       Progression   Progression Continue to progress workloads to maintain intensity without signs/symptoms of physical distress. Continue to progress workloads to maintain intensity without signs/symptoms of physical distress. Continue to progress workloads to maintain intensity without signs/symptoms of physical distress.       Resistance Training   Training Prescription Yes Yes Yes     Weight 3 lbs 3 lbs 3     Reps -- 10-15  10-15       Treadmill   MPH -- 2 2.2     Grade -- 0.5 0.5     Minutes -- 15 15     METs -- 2.67 2.84       Recumbant Elliptical   Level -- 3 3     RPM -- 47 52     Minutes -- 15 15     METs -- 3.4 4.9       Home Exercise Plan   Plans to continue exercise at -- Home (comment) Home (comment)     Frequency -- Add 2 additional days to program exercise sessions. Add 2 additional days to program exercise sessions.       Oxygen   Maintain Oxygen Saturation -- 88% or higher 88% or higher              Exercise Comments:   Exercise Comments     Row Name 06/17/23 [phone]  Exercise Comments Reviewed home exercise                Exercise Goals and Review:   Exercise Goals     Row Name 06/04/23 1005             Exercise Goals   Increase Physical Activity Yes       Intervention Provide advice, education, support and counseling about physical activity/exercise needs.;Develop an individualized exercise prescription for aerobic and resistive training based on initial evaluation findings, risk stratification, comorbidities and participant's personal goals.       Expected Outcomes Short Term: Attend rehab on a regular basis to increase amount of physical activity.;Long Term: Add in home exercise to make exercise part of routine and to increase amount of physical activity.;Long Term: Exercising regularly at least 3-5 days a week.       Increase Strength and Stamina Yes       Intervention Provide advice, education, support and counseling about physical activity/exercise needs.;Develop an individualized exercise prescription for aerobic and resistive training based on initial evaluation findings, risk stratification, comorbidities and participant's personal goals.       Expected Outcomes Short Term: Increase workloads from initial exercise prescription for resistance, speed, and METs.;Short Term: Perform resistance training exercises routinely during rehab and add in  resistance training at home;Long Term: Improve cardiorespiratory fitness, muscular endurance and strength as measured by increased METs and functional capacity ( )       Able to understand and use rate of perceived exertion (RPE) scale Yes       Intervention Provide education and explanation on how to use RPE scale       Expected Outcomes Short Term: Able to use RPE daily in rehab to express subjective intensity level;Long Term:  Able to use RPE to guide intensity level when exercising independently       Able to understand and use Dyspnea scale Yes       Intervention Provide education and explanation on how to use Dyspnea scale       Expected Outcomes Short Term: Able to use Dyspnea scale daily in rehab to express subjective sense of shortness of breath during exertion;Long Term: Able to use Dyspnea scale to guide intensity level when exercising independently       Knowledge and understanding of Target Heart Rate Range (THRR) Yes       Intervention Provide education and explanation of THRR including how the numbers were predicted and where they are located for reference       Expected Outcomes Short Term: Able to use daily as guideline for intensity in rehab;Short Term: Able to state/look up THRR;Long Term: Able to use THRR to govern intensity when exercising independently       Able to check pulse independently Yes       Intervention Provide education and demonstration on how to check pulse in carotid and radial arteries.;Review the importance of being able to check your own pulse for safety during independent exercise       Expected Outcomes Short Term: Able to explain why pulse checking is important during independent exercise;Long Term: Able to check pulse independently and accurately       Understanding of Exercise Prescription Yes       Intervention Provide education, explanation, and written materials on patient's individual exercise prescription       Expected Outcomes Short Term: Able to  explain program exercise prescription;Long Term: Able to explain home exercise prescription to exercise independently  Exercise Goals Re-Evaluation :  Exercise Goals Re-Evaluation     Row Name 06/08/23 0800 06/15/23 0757 06/15/23 1332 07/01/23 1312 07/06/23 0759     Exercise Goal Re-Evaluation   Exercise Goals Review Increase Physical Activity;Increase Strength and Stamina;Able to understand and use Dyspnea scale;Understanding of Exercise Prescription Increase Physical Activity;Increase Strength and Stamina;Understanding of Exercise Prescription Increase Physical Activity;Increase Strength and Stamina;Understanding of Exercise Prescription Increase Physical Activity;Increase Strength and Stamina;Understanding of Exercise Prescription Increase Physical Activity;Increase Strength and Stamina;Understanding of Exercise Prescription   Comments Reviewed RPE  and dyspnea scale, THR and program prescription with pt today.  Pt voiced understanding and was given a copy of goals to take home. Maanasa has just started the program in the past week. She has been doing great in the program so far. She continues to walk everyday. She stated that she feels better and has more energy. She enjoys coming to rehab and aslo likes the social aspect. Eudell has tolerated exercise well. She is increaing her level on the XR to level 3. She is also walking everyday. Will continue to monitor and progress as able. Gaylynn has been tolerating exericse well, She has inceased her speed and incline on the treadmill in the last week to 2.2/0.5. Will continue to monitor and progress as able. Brixton is doing well in rehab.  She is starting to improve her stamina.  Her breathing is starting to improve.  Reviewed home exercise with pt today.  Pt plans to walk and use [employer] at home for exercise.  She also has a exercise bike in her basement.  Reviewed THR, pulse, RPE, sign and symptoms, pulse oximetery and when to  call 911 or MD.  Also discussed weather considerations and indoor options.  Pt voiced understanding.   Expected Outcomes Short: Use RPE daily to regulate intensity. Long: Follow program prescription in THR. Short term: go over home exercise   long term: continue to attend rehab Short: continue to exercise at RPE of 12 then increase workload    Long term: continue to attend rehab Short term: continue to exerise at workload and increase when ready   long term: continue to attend rehab Short: start to add in more exercise at home. Long: conintue to improve stamina.    Row Name 07/28/23 0737 08/03/23 0758 09/02/23 0818 09/22/23 0708       Exercise Goal Re-Evaluation   Exercise Goals Review Increase Physical Activity;Able to understand and use rate of perceived exertion (RPE) scale;Understanding of Exercise Prescription Increase Physical Activity;Increase Strength and Stamina;Understanding of Exercise Prescription Increase Physical Activity;Increase Strength and Stamina;Understanding of Exercise Prescription Increase Physical Activity;Increase Strength and Stamina;Understanding of Exercise Prescription    Comments Kahealani is doing well in rehab. She has increased her level on the XR to level 3 and is pushing herself. She is focusing on her oxygen and PLB. She is currently exercising at 3.1 METs. Will continue to monitor and progress as able. Shanetta is doing doing well in rehab. She is walking and using her cardioglide on her off days. She is also doing weights and stretches each day.  She aims for 25-30 min each day.  She is looking forward to seeing her doctor on Wednesday.  He is hoping he will be pleasantly surprised. Lorelai is doing well in rehab. She continues to exericse outside of rehab by walking, weights and stretching. She states that she has more engery to do her everyday activities. Marlet is doing well in rehab. She is tolerating exercise and  is is setting goals on the XR for how many miles she  rides each class. On the treadmill her oxygen does tend to drop to 86-88 but goes back up when she stops and does PLB. She is on her 30th visit so she is almost done with the program. Will continue to monitor and progress as able.    Expected Outcomes Short: increase walking speed on treadmill   long: continue to attend rehab Short: Continue to exercise on her off days Long: Conitnue to improve stamina Short: Continue to exercise on her off days Long: Conitnue to improve stamina Short: Continue to exercise on her off days Long: Conitnue to improve stamina             Discharge Exercise Prescription (Final Exercise Prescription Changes):  Exercise Prescription Changes - 09/21/23 1200       Response to Exercise   Blood Pressure (Admit) 120/60    Blood Pressure (Exit) 128/70    Heart Rate (Admit) 64 bpm    Heart Rate (Exercise) 93 bpm    Heart Rate (Exit) 70 bpm    Oxygen Saturation (Admit) 94 %    Oxygen Saturation (Exercise) 88 %    Oxygen Saturation (Exit) 93 %    Rating of Perceived Exertion (Exercise) 12    Perceived Dyspnea (Exercise) 2    Duration Continue with 30 min of aerobic exercise without signs/symptoms of physical distress.    Intensity THRR unchanged      Progression   Progression Continue to progress workloads to maintain intensity without signs/symptoms of physical distress.      Resistance Training   Training Prescription Yes    Weight 3    Reps 10-15      Treadmill   MPH 2.2    Grade 0.5    Minutes 15    METs 2.84      Recumbant Elliptical   Level 3    RPM 52    Minutes 15    METs 4.9      Home Exercise Plan   Plans to continue exercise at Home (comment)    Frequency Add 2 additional days to program exercise sessions.      Oxygen   Maintain Oxygen Saturation 88% or higher             Nutrition:  Target Goals: Understanding of nutrition guidelines, daily intake of sodium 1500mg , cholesterol 200mg , calories 30% from fat and 7% or less from  saturated fats, daily to have 5 or more servings of fruits and vegetables.  Biometrics:  Pre Biometrics - 06/04/23 1006       Pre Biometrics   Height 5' 1.5 (1.562 m)    Weight 142 lb 10.2 oz (64.7 kg)    Waist Circumference 33.5 inches    Hip Circumference 38 inches    Waist to Hip Ratio 0.88 %    BMI (Calculated) 26.52    Grip Strength 25.3 kg              Nutrition Therapy Plan and Nutrition Goals:   Nutrition Assessments:  MEDIFICTS Score Key: >=70 Need to make dietary changes  40-70 Heart Healthy Diet <= 40 Therapeutic Level Cholesterol Diet  Flowsheet Row PULMONARY REHAB CHRONIC OBSTRUCTIVE PULMONARY DISEASE from 06/08/2023 in [facility]  Picture Your Plate Total Score on Admission 13      Picture Your Plate Scores: <59 Unhealthy dietary pattern with much room for improvement. 41-50 Dietary pattern unlikely to meet recommendations  for good health and room for improvement. 51-60 More healthful dietary pattern, with some room for improvement.  >60 Healthy dietary pattern, although there may be some specific behaviors that could be improved.    Nutrition Goals Re-Evaluation:  Nutrition Goals Re-Evaluation     Row Name 06/15/23 [phone] 07/06/23 0808 08/03/23 0803 09/02/23 0829       Goals   Nutrition Goal Healthy eating Short term: continue to cut down on chips long term: continue to eat healthy Short: Conitnue to stay away from bad foods and splurge on occassion Long; conitnue to eat healthy Healthy eating    Comment Sherleen has been working on her healthy eating. She has started to cook veggie and has learned she likes peppers. She eats more chicken within the week and it is normally baked or grilled. She stated that she does not eat a lot of sweets. She is happy with her progress in healthy eating. Amos has been working on her diet.  She has been staying away from chips and sweets.  It is starting to pay off as her weight is starting to  come down.  She is getting lots of fruits and vegetables.  She is pleased with weight coming off.  She says she is feeling better.  She is staying away from soda and milk and sticking to green tea and water .  She says she feels better overall with her diet now. Kiriana continues to work on her diet.  She is staying away from the bad foods and sticks with her healthier snacks.  She does have a little debbie on occassion, but trying to stay away.  She has been enjoying the clemintines as a snack.  She contineus to stay away from chips too. Myya continues to work on her diet. She has been cutting back on her sweets and picking healthier snacks. She stated that she has lost some weight and is watching the calories in what she eats.    Expected Outcome Short term: continue to cut down on chips   long term: continue to eat healthy Short: Conitnue to stay away from bad foods and splurge on occassion Long; conitnue to eat healthy short: Continue to sway out for healthy snacks Long: Continue to focus on healthy eating habits Short: Continues to pick healthy snacks   long term: foacus on healtjy eating             Nutrition Goals Discharge (Final Nutrition Goals Re-Evaluation):  Nutrition Goals Re-Evaluation - 09/02/23 0829       Goals   Nutrition Goal Healthy eating    Comment Labrina continues to work on her diet. She has been cutting back on her sweets and picking healthier snacks. She stated that she has lost some weight and is watching the calories in what she eats.    Expected Outcome Short: Continues to pick healthy snacks   long term: foacus on healtjy eating             Psychosocial: Target Goals: Acknowledge presence or absence of significant depression and/or stress, maximize coping skills, provide positive support system. Participant is able to verbalize types and ability to use techniques and skills needed for reducing stress and depression.  Initial Review & Psychosocial  Screening:  Initial Psych Review & Screening - 05/29/23 0936       Initial Review   Current issues with Current Sleep Concerns      Family Dynamics   Good Support System? Yes  Barriers   Psychosocial barriers to participate in program There are no identifiable barriers or psychosocial needs.      Screening Interventions   Interventions Encouraged to exercise;To provide support and resources with identified psychosocial needs;Provide feedback about the scores to participant    Expected Outcomes Short Term goal: Utilizing psychosocial counselor, staff and physician to assist with identification of specific Stressors or current issues interfering with healing process. Setting desired goal for each stressor or current issue identified.;Long Term Goal: Stressors or current issues are controlled or eliminated.;Short Term goal: Identification and review with participant of any Quality of Life or Depression concerns found by scoring the questionnaire.;Long Term goal: The participant improves quality of Life and PHQ9 Scores as seen by post scores and/or verbalization of changes             Quality of Life Scores:  Scores of 19 and below usually indicate a poorer quality of life in these areas.  A difference of  2-3 points is a clinically meaningful difference.  A difference of 2-3 points in the total score of the Quality of Life Index has been associated with significant improvement in overall quality of life, self-image, physical symptoms, and general health in studies assessing change in quality of life.   PHQ-9: Review Flowsheet  More data exists      09/03/2023 08/05/2023 06/04/2023 03/05/2023 02/12/2023  Depression screen PHQ 2/9  Decreased Interest 0 0 0 0 0  Down, Depressed, Hopeless 0 0 0 0 0  PHQ - 2 Score 0 0 0 0 0  Altered sleeping 0 1 1 0 -  Tired, decreased energy 0 0 0 1 -  Change in appetite 0 0 0 0 -  Feeling bad or failure about yourself  0 0 0 0 -  Trouble  concentrating 0 0 0 0 -  Moving slowly or fidgety/restless 0 0 0 0 -  Suicidal thoughts 0 0 0 0 -  PHQ-9 Score 0 1 1 1  -  Difficult doing work/chores Not difficult at all - Not difficult at all Somewhat difficult -   Interpretation of Total Score  Total Score Depression Severity:  1-4 = Minimal depression, 5-9 = Mild depression, 10-14 = Moderate depression, 15-19 = Moderately severe depression, 20-27 = Severe depression   Psychosocial Evaluation and Intervention:  Psychosocial Evaluation - 05/29/23 0936       Psychosocial Evaluation & Interventions   Interventions Stress management education;Relaxation education;Encouraged to exercise with the program and follow exercise prescription    Comments 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 She lives with her husband of 42 years and they have a son and 2 grandchildren. Her husband and grandchildren are her main support people. She tries to live a very active life but has not been able to do as much as she would like recently due to her advancing COPD. She does walk everyday 30 mins. She denies any depression, anxiety or stress. She does have trouble staying asleep some nights. She recently started taking benadryl and was taking the generic brand from [facility]. She did not realize she was taking benadryl and ask if this was safe.  I advised her to speak with her pcp about her concerns about taking benadryl. She has tried melotonin in the past and it did not help. She said she had been reluctant to participate in the program for a long time eventhough Dr. [name] had recommended it but she saw a video on you-tube about pulmonary rehabilitation  and its benefits and decided to give it a try. She does have a $15 dollar copayment and she was not sure if this was going to be a barrier but said she wanted to do as much as she could of the program. No other barriers identified. Her main goals for the program are to get her lungs stronger and be able  to do all the activities she wants to do. She wants to be able to help her husband mow the yard. She said she was not able to go out onto the beach with her grandchildren this summer for the first time since they were born, 14 years, due to her SOB and this really brothered her. She would like to get her lungs strong enough to participate in these activities.    Expected Outcomes Short Term: Start and attend the program consistently. Long Term: Get her lung stronger and be able to do all the activities she wants to do.    Continue Psychosocial Services  Follow up required by staff             Psychosocial Re-Evaluation:  Psychosocial Re-Evaluation     Row Name 06/15/23 0806 07/06/23 0811 08/03/23 0801 09/02/23 0820       Psychosocial Re-Evaluation   Current issues with Current Sleep Concerns Current Sleep Concerns Current Sleep Concerns Current Sleep Concerns    Comments Ceairra said that she was taking an sleep aid she got from [employer] and when talking to her PCP they noticed it was a form of benadryl. She has started to not take anything for sleep. She said she will go to sleep pretty fast but will wake up within 2 hours and her mind will start racing. SHe has no other barriors Leanny is doing well in rehab.  She had a hard time sleeping last night. She has a hard time calming her mind and staying awake from husband keeping her awake with his snoring.  Otherwise, she is feeling good mentally and doing well.  She has gotten used to the lack of sleep and adjust each day.  She is generally a positive person and enjoys coming to exercise. Yasmine is doing well in rehab.  She is feeling good mentally.  She is enjoying that she is feeling better and more active again. She does admit to a short fuse and prays about it.  She denies any stressors.  She continues to just get her 3 hrs of sleep with naps in the afternoon. She feels like it has gotten a little better with exercise.  She is enjoying rehab  and getting out with her classmates. Marshayla is well in rehab. SHe is feeling good overall and enjoys coming to rehab. She is being more active. She denies any stressors. She is still napping during the day. She feels better overall with exericse.    Expected Outcomes Shor term: continue to work on sleep    Long term: continue to have no barriors Short: Continue to work on sleep Long: conitnue to stay positive. Short: Continue to exercise for mental boost and sleep Long: Conitnue to focus on positives. Short: Continue to exercise for mental boost and sleep Long: Conitnue to focus on positives.    Interventions Stress management education;Relaxation education;Encouraged to attend Pulmonary Rehabilitation for the exercise Stress management education;Relaxation education;Encouraged to attend Pulmonary Rehabilitation for the exercise Stress management education;Relaxation education;Encouraged to attend Pulmonary Rehabilitation for the exercise Stress management education;Relaxation education;Encouraged to attend Pulmonary Rehabilitation for the exercise  Continue Psychosocial Services  Follow up required by staff Follow up required by staff Follow up required by staff Follow up required by staff             Psychosocial Discharge (Final Psychosocial Re-Evaluation):  Psychosocial Re-Evaluation - 09/02/23 0820       Psychosocial Re-Evaluation   Current issues with Current Sleep Concerns    Comments Jacqulin is well in rehab. SHe is feeling good overall and enjoys coming to rehab. She is being more active. She denies any stressors. She is still napping during the day. She feels better overall with exericse.    Expected Outcomes Short: Continue to exercise for mental boost and sleep Long: Conitnue to focus on positives.    Interventions Stress management education;Relaxation education;Encouraged to attend Pulmonary Rehabilitation for the exercise    Continue Psychosocial Services  Follow up required by  staff              Education: Education Goals: Education classes will be provided on a weekly basis, covering required topics. Participant will state understanding/return demonstration of topics presented.  Learning Barriers/Preferences:  Learning Barriers/Preferences - 05/29/23 0935       Learning Barriers/Preferences   Learning Barriers None    Learning Preferences Audio;Written Material             Education Topics: How Lungs Work and Diseases: - Discuss the anatomy of the lungs and diseases that can affect the lungs, such as COPD. Flowsheet Row PULMONARY REHAB CHRONIC OBSTRUCTIVE PULMONARY DISEASE from 09/02/2023 in [facility]  Date 08/05/23  Educator [facility]  Instruction Review Code 1- Verbalizes Understanding       Exercise: -Discuss the importance of exercise, FITT principles of exercise, normal and abnormal responses to exercise, and how to exercise safely.   Environmental Irritants: -Discuss types of environmental irritants and how to limit exposure to environmental irritants.   Meds/Inhalers and oxygen: - Discuss respiratory medications, definition of an inhaler and oxygen, and the proper way to use an inhaler and oxygen.   Energy Saving Techniques: - Discuss methods to conserve energy and decrease shortness of breath when performing activities of daily living.    Bronchial Hygiene / Breathing Techniques: - Discuss breathing mechanics, pursed-lip breathing technique,  proper posture, effective ways to clear airways, and other functional breathing techniques   Cleaning Equipment: - Provides group verbal and written instruction about the health risks of elevated stress, cause of high stress, and healthy ways to reduce stress.   Nutrition I: Fats: - Discuss the types of cholesterol, what cholesterol does to the body, and how cholesterol levels can be controlled. Flowsheet Row PULMONARY REHAB CHRONIC OBSTRUCTIVE PULMONARY DISEASE  from 09/02/2023 in [employer] [facility]  Date 07/08/23  Educator [facility]  Instruction Review Code 1- Verbalizes Understanding       Nutrition II: Labels: -Discuss the different components of food labels and how to read food labels. Flowsheet Row PULMONARY REHAB CHRONIC OBSTRUCTIVE PULMONARY DISEASE from 09/02/2023 in [place] [facility]  Date 07/08/23  Educator [facility]  Instruction Review Code 1- Verbalizes Understanding       Respiratory Infections: - Discuss the signs and symptoms of respiratory infections, ways to prevent respiratory infections, and the importance of seeking medical treatment when having a respiratory infection.   Stress I: Signs and Symptoms: - Discuss the causes of stress, how stress may lead to anxiety and depression, and ways to limit stress. Flowsheet Row PULMONARY REHAB CHRONIC OBSTRUCTIVE PULMONARY DISEASE from  09/02/2023 in [place] [facility]  Date 07/15/23  Educator [facility]  Instruction Review Code 1- Verbalizes Understanding       Stress II: Relaxation: -Discuss relaxation techniques to limit stress. Flowsheet Row PULMONARY REHAB CHRONIC OBSTRUCTIVE PULMONARY DISEASE from 09/02/2023 in [place] [facility]  Date 07/15/23  Educator [employer]  Instruction Review Code 1- Verbalizes Understanding       Oxygen for Home/Travel: - Discuss how to prepare for travel when on oxygen and proper ways to transport and store oxygen to ensure safety.   Knowledge Questionnaire Score:  Knowledge Questionnaire Score - 06/08/23 0812       Knowledge Questionnaire Score   Pre Score 15/18             Core Components/Risk Factors/Patient Goals at Admission:  Personal Goals and Risk Factors at Admission - 05/29/23 0935       Core Components/Risk Factors/Patient Goals on Admission    Weight Management Weight Maintenance    Improve shortness of breath with ADL's Yes    Intervention Provide education, individualized  exercise plan and daily activity instruction to help decrease symptoms of SOB with activities of daily living.    Expected Outcomes Short Term: Improve cardiorespiratory fitness to achieve a reduction of symptoms when performing ADLs;Long Term: Be able to perform more ADLs without symptoms or delay the onset of symptoms    Increase knowledge of respiratory medications and ability to use respiratory devices properly  Yes    Intervention Provide education and demonstration as needed of appropriate use of medications, inhalers, and oxygen therapy.    Expected Outcomes Short Term: Achieves understanding of medications use. Understands that oxygen is a medication prescribed by physician. Demonstrates appropriate use of inhaler and oxygen therapy.;Long Term: Maintain appropriate use of medications, inhalers, and oxygen therapy.    Hypertension Yes    Intervention Provide education on lifestyle modifcations including regular physical activity/exercise, weight management, moderate sodium restriction and increased consumption of fresh fruit, vegetables, and low fat dairy, alcohol moderation, and smoking cessation.;Monitor prescription use compliance.    Expected Outcomes Short Term: Continued assessment and intervention until BP is < 140/24mm HG in hypertensive participants. < 130/21mm HG in hypertensive participants with diabetes, heart failure or chronic kidney disease.;Long Term: Maintenance of blood pressure at goal levels.    Lipids Yes    Intervention Provide education and support for participant on nutrition & aerobic/resistive exercise along with prescribed medications to achieve LDL 70mg , HDL >40mg .    Expected Outcomes Short Term: Participant states understanding of desired cholesterol values and is compliant with medications prescribed. Participant is following exercise prescription and nutrition guidelines.;Long Term: Cholesterol controlled with medications as prescribed, with individualized exercise  RX and with personalized nutrition plan. Value goals: LDL < 70mg , HDL > 40 mg.             Core Components/Risk Factors/Patient Goals Review:   Goals and Risk Factor Review     Row Name 06/15/23 9177 07/06/23 0806 08/03/23 0805 09/02/23 0832       Core Components/Risk Factors/Patient Goals Review   Personal Goals Review Improve shortness of breath with ADL's;Weight Management/Obesity Improve shortness of breath with ADL's;Weight Management/Obesity;Increase knowledge of respiratory medications and ability to use respiratory devices properly. Improve shortness of breath with ADL's;Weight Management/Obesity;Increase knowledge of respiratory medications and ability to use respiratory devices properly.;Hypertension Improve shortness of breath with ADL's;Weight Management/Obesity;Increase knowledge of respiratory medications and ability to use respiratory devices properly.;Hypertension    Review Laniya is doing  good with exercise. She is working on [employer] with exercising by walking everyday and eating healthy. She stated that she wants to keep around the same weight she is and does not want to gain. She is also focusing on PLB to help improve her SOB. She has noticed she breaths through her mouth a lot and is trying to remeber PLB. She stated that she has felt better since exercising. Adrie is doing well in rehab.  She feels like her breathing is getting better overall.  She is able to do more with less SOB.  She does still see her saturations dropping but they do recover with PLB.  She is doing well on her meds.  She was excited about her weight today.  She was under 140 lb today!!  She is doing better with her diet and it is finally starting to pay off. Kailani is doing well in rehab.  Her weight continues to come down.  She is pleased that it is showing on scale.  She goes to doctor on Wednesday and eager to show off her progress in rehab.  Her breathing has gotten better but her  saturations are still on the lower side.  She is doing well on her meds.  Her blood pressures have been good.  She is going to take report with her to her appt. Katianna is doing well in rehab. Her weight as came down some and she is pleased. She is breathing a little better during exericse she states but her stats are lower in the 86-88%. Thye drop whe she is talking while exericsing the most. Her blood pressure continues to be [employer].    Expected Outcomes Short term: comtinue to focuse of PLB and weight managment   long term: continue to enjoy exercising and walking at home Short: Conitnue to work on breathing Long: Conitnue to work on [employer] loss Short: Take her report to doctor on Wednesday Long: Continue to work on breathing Short: continue to montior her oxygen stats  long term: use PLB when exericing and montiro BP             Core Components/Risk Factors/Patient Goals at Discharge (Final Review):   Goals and Risk Factor Review - 09/02/23 0832       Core Components/Risk Factors/Patient Goals Review   Personal Goals Review Improve shortness of breath with ADL's;Weight Management/Obesity;Increase knowledge of respiratory medications and ability to use respiratory devices properly.;Hypertension    Review Suella is doing well in rehab. Her weight as came down some and she is pleased. She is breathing a little better during exericse she states but her stats are lower in the 86-88%. Thye drop whe she is talking while exericsing the most. Her blood pressure continues to be [employer].    Expected Outcomes Short: continue to montior her oxygen stats  long term: use PLB when exericing and montiro BP             ITP Comments:  ITP Comments     Row Name 06/08/23 0759 07/01/23 0643 07/29/23 0647 08/26/23 0810 09/22/23 0734   ITP Comments First full day of exercise!  Patient was oriented to gym and equipment including functions, settings, policies, and procedures.  Patient's individual exercise  prescription and treatment plan were reviewed.  All starting workloads were established based on the results of the 6 minute walk test done at initial orientation visit.  The plan for exercise progression was also introduced and progression will be customized based on  patient's performance and goals 30 day review completed. ITP sent to Dr.Jehanzeb Memon, Medical Director of  Pulmonary Rehab. Continue with ITP unless changes are made by physician. 30 day review completed. ITP sent to Dr.Jehanzeb Memon, Medical Director of  Pulmonary Rehab. Continue with ITP unless changes are made by physician. 30 day review completed. ITP sent to Dr.Jehanzeb Memon, Medical Director of  Pulmonary Rehab. Continue with ITP unless changes are made by physician. 30 day review completed. ITP sent to Dr.Jehanzeb Memon, Medical Director of  Pulmonary Rehab. Continue with ITP unless changes are made by physician.            Comments: 30 day review

## 2023-09-28 ENCOUNTER — Encounter (HOSPITAL_COMMUNITY): Payer: Medicare Other

## 2023-09-30 ENCOUNTER — Encounter (HOSPITAL_COMMUNITY): Payer: Medicare Other

## 2023-10-05 ENCOUNTER — Encounter (HOSPITAL_COMMUNITY): Payer: Medicare Other

## 2023-10-21 ENCOUNTER — Encounter (HOSPITAL_COMMUNITY)
Admission: RE | Admit: 2023-10-21 | Discharge: 2023-10-21 | Disposition: A | Discharge status: Home / Self Care | Payer: Medicare Other | Source: Ambulatory Visit | Attending: Pulmonary Disease | Admitting: Pulmonary Disease

## 2023-10-21 ENCOUNTER — Encounter (HOSPITAL_COMMUNITY): Payer: Self-pay | Admitting: *Deleted

## 2023-10-21 DIAGNOSIS — J4489 Other specified chronic obstructive pulmonary disease: Secondary | ICD-10-CM | POA: Diagnosis not present

## 2023-10-21 NOTE — Progress Notes (Signed)
Daily Session Note  Patient Details  Name: BETZY BARBIER MRN: [id number] Date of Birth: 05/30/1961 Referring Provider:   Flowsheet Row PULMONARY REHAB COPD ORIENTATION from 06/04/2023 in [facility] CARD[facility]ATION  Referring Provider Dr. Vassie Loll       Encounter Date: 10/21/2023  Check In:  Session Check In - 10/21/23 0803       Check-In   Supervising physician immediately available to respond to emergencies See telemetry face sheet for immediately available MD    Location [facility]    Staff Present Fabio Pierce, MA, RCEP, CCRP, CCET;Heather Fredric Mare, BS, Exercise Physiologist;Debra Laural Benes, RN, BSN    Virtual Visit No    Medication changes reported     No    Fall or balance concerns reported    No    Warm-up and Cool-down Performed on first and last piece of equipment    Resistance Training Performed Yes    VAD Patient? No    PAD/SET Patient? No      Pain Assessment   Currently in Pain? No/denies             Capillary Blood Glucose: No results found for this or any previous visit (from the past 24 hours).    Social History   Tobacco Use  Smoking Status Former   Current packs/day: 0.00   Average packs/day: 3.0 packs/day for 33.0 years (99.0 ttl pk-yrs)   Types: Cigarettes   Start date: 10/27/1972   Quit date: 2007   Years since quitting: 18.0  Smokeless Tobacco Never    Goals Met:  Proper associated with RPD/PD & O2 Sat Independence with exercise equipment Using PLB without cueing & demonstrates good technique Exercise tolerated well No report of concerns or symptoms today Strength training completed today  Goals Unmet:  Not Applicable  Comments: Pt able to follow exercise prescription today without complaint.  Will continue to monitor for progression.

## 2023-10-21 NOTE — Progress Notes (Signed)
Pulmonary Individual Treatment Plan  Patient Details  Name: [name] MRN: [id number] Date of Birth: [birth year] Referring Provider:   Flowsheet Row PULMONARY REHAB COPD [facility]ATION [facility]  Referring Provider Dr. 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)  Patient's Home Medications on Admission:   Current Outpatient Medications:    albuterol (PROVENTIL) (2.5 MG/3ML) 0.083% nebulizer solution, USE 1 VIAL IN NEBULIZER EVERY 4 HOURS AS NEEDED FOR SHORTNESS OF BREATH, Disp: 360 mL, Rfl: 0   albuterol (VENTOLIN HFA) 108 (90 Base) MCG/ACT inhaler, Inhale 2 puffs into the lungs every 4 (four) hours as needed for wheezing or shortness of breath., Disp: 8 g, Rfl: 5   amoxicillin-clavulanate (AUGMENTIN) 875-125 MG tablet, Take 1 tablet by mouth 2 (two) times daily., Disp: 14 tablet, Rfl: 0   aspirin 81 MG EC tablet, Take 81 mg by mouth at bedtime., Disp: , Rfl:    Cholecalciferol (VITAMIN D3) 400 units CAPS, Take 400 Units by mouth at bedtime. , Disp: , Rfl:    ezetimibe (ZETIA) 10 MG tablet, Take 1 tablet (10 mg total) by mouth daily., Disp: 90 tablet, Rfl: 3   Fluticasone-Umeclidin-Vilant (TRELEGY ELLIPTA) 100-62.5-25 MCG/ACT AEPB, Inhale 1 puff into the lungs daily., Disp: 1 each, Rfl: 11   levothyroxine (SYNTHROID) 125 MCG tablet, 1 qam all days except Mondays  use 1/2 in the am., Disp: 90 tablet, Rfl: 1   metoprolol succinate (TOPROL-XL) 25 MG 24 hr tablet, 1/2 qd, Disp: 90 tablet, Rfl: 3   Multiple Vitamin (MULTIVITAMIN) tablet, Take 1 tablet by mouth daily., Disp: , Rfl:    nitroGLYCERIN (NITROSTAT) 0.4 MG SL tablet, Place 1 tablet (0.4 mg total) under the tongue every 5 (five) minutes x 3 doses as needed for chest pain (if no relief after 3rd dose, proceed to ED or call  911)., Disp: 25 tablet, Rfl: 3   pantoprazole (PROTONIX) 40 MG tablet, Take 1 tablet (40 mg total) by mouth daily., Disp: 90 tablet, Rfl: 1   predniSONE (DELTASONE) 20 MG tablet, 2 every day for 5 days, Disp: 10 tablet, Rfl: 0   rosuvastatin (CRESTOR) 40 MG tablet, Take 1 tablet (40 mg total) by mouth daily., Disp: 90 tablet, Rfl: 1   vitamin C (ASCORBIC ACID) 500 MG tablet, Take 500 mg by mouth daily., Disp: , Rfl:  No current facility-administered medications for this visit.  Facility-Administered Medications Ordered in Other Visits:    [facility] dose#1 phenylephrine 1%/ketorolac 0.3% (OMIDRIA) in BSS 500 ML (MUST DILUTE PRIOR TO USE) [employer], , , PRN, Pecolia Ades, MD, 500 mL at 11/14/22 1054  Past Medical History: Past Medical History:  Diagnosis Date   COPD (chronic obstructive pulmonary disease) (HCC)    Coronary atherosclerosis of native coronary artery    DES RCA 5/09, nonobstructive left system   Essential hypertension    Hypothyroidism    Mixed hyperlipidemia    NSTEMI (non-ST elevated myocardial infarction) (HCC)    2009    Tobacco Use: Social History   Tobacco Use  Smoking Status Former   Current packs/day: 0.00   Average packs/day: 3.0 packs/day for 33.0 years (99.0 ttl pk-yrs)   Types: Cigarettes   Start date: 10/27/1972   Quit date: 2007   Years since quitting: 18.0  Smokeless Tobacco Never  Labs: Review Flowsheet  More data exists      Latest Ref Rng & Units 07/29/2021 01/01/2022 07/03/2022 01/19/2023 07/27/2023  Labs for ITP Cardiac and Pulmonary Rehab  Cholestrol 100 - 199 mg/dL 161  096  045  409  811   LDL (calc) 0 - 99 mg/dL 94  70  90  83  71   HDL-C >39 mg/dL 49  57  52  53  53   Trlycerides 0 - 149 mg/dL 914  782  956  213  086     Capillary Blood Glucose: Lab Results  Component Value Date   GLUCAP 97 06/21/2018     Pulmonary Assessment Scores:  Pulmonary Assessment Scores     Row Name 06/08/23 0811         ADL UCSD   ADL Phase  Entry     SOB Score total 54     Rest 0     Walk 3     Stairs 3     Bath 1     Dress 1     Shop 3       CAT Score   CAT Score 18       mMRC Score   mMRC Score 3             UCSD: Self-administered rating of dyspnea associated with activities of daily living (ADLs) 6-point scale (0 = "not at all" to 5 = "maximal or unable to do because of breathlessness")  Scoring Scores range from 0 to 120.  Minimally important difference is 5 units  CAT: CAT can identify the health impairment of COPD patients and is better correlated with disease progression.  CAT has a scoring range of zero to 40. The CAT score is classified into four groups of low (less than 10), medium (10 - 20), high (21-30) and very high (31-40) based on the impact level of disease on health status. A CAT score over 10 suggests significant symptoms.  A worsening CAT score could be explained by an exacerbation, poor medication adherence, poor inhaler technique, or progression of COPD or comorbid conditions.  CAT MCID is 2 points  mMRC: mMRC (Modified Medical Research Council) Dyspnea Scale is used to assess the degree of baseline functional disability in patients of respiratory disease due to dyspnea. No minimal important difference is established. A decrease in score of 1 point or greater is considered a positive change.   Pulmonary Function Assessment:   Exercise Target Goals: Exercise Program Goal: Individual exercise prescription set using results from initial 6 min walk test and THRR while considering  patient's activity barriers and safety.   Exercise Prescription Goal: Initial exercise prescription builds to 30-45 minutes a day of aerobic activity, 2-3 days per week.  Home exercise guidelines will be given to patient during program as part of exercise prescription that the participant will acknowledge.  Activity Barriers & Risk Stratification:  Activity Barriers & Cardiac Risk Stratification - 05/29/23 0907        Activity Barriers & Cardiac Risk Stratification   Activity Barriers Shortness of Breath;Deconditioning    Cardiac Risk Stratification Moderate             6 Minute Walk:  6 Minute Walk     Row Name 06/04/23 1003         6 Minute Walk   Phase Initial     Distance 1060 feet     Walk Time 6 minutes     #  of Rest Breaks 0     MPH 2.01     METS 2.66     RPE 11     Perceived Dyspnea  1     VO2 Peak 9.31     Symptoms No     Resting HR 67 bpm     Resting BP 110/60     Resting Oxygen Saturation  94 %     Exercise Oxygen Saturation  during 6 min walk 85 %     Max Ex. HR 90 bpm     Max Ex. BP 132/60     2 Minute Post BP 120/60       Interval HR   1 Minute HR 106     2 Minute HR 93     3 Minute HR 88     4 Minute HR 90     5 Minute HR 88     6 Minute HR 89     2 Minute Post HR 75     Interval Heart Rate? Yes       Interval Oxygen   Interval Oxygen? Yes     Baseline Oxygen Saturation % 94 %     1 Minute Oxygen Saturation % 88 %     1 Minute Liters of Oxygen 0 L     2 Minute Oxygen Saturation % 86 %     2 Minute Liters of Oxygen 0 L     3 Minute Oxygen Saturation % 86 %     3 Minute Liters of Oxygen 0 L     4 Minute Oxygen Saturation % 85 %     4 Minute Liters of Oxygen 0 L     5 Minute Oxygen Saturation % 86 %     5 Minute Liters of Oxygen 0 L     6 Minute Oxygen Saturation % 87 %     6 Minute Liters of Oxygen 0 L     2 Minute Post Oxygen Saturation % 94 %     2 Minute Post Liters of Oxygen 0 L              Oxygen Initial Assessment:   Oxygen Re-Evaluation:  Oxygen Re-Evaluation     Row Name 06/08/23 0802 06/15/23 0819 07/06/23 0813 08/03/23 0807 09/02/23 0834     Program Oxygen Prescription   Program Oxygen Prescription -- None None None None     Home Oxygen   Home Oxygen Device -- None None None None   Sleep Oxygen Prescription -- None None None None   Home Exercise Oxygen Prescription -- None None None None   Home Resting Oxygen  Prescription -- None None None None     Goals/Expected Outcomes   Short Term Goals -- -- To learn and understand importance of monitoring SPO2 with pulse oximeter and demonstrate accurate use of the pulse oximeter.;To learn and understand importance of maintaining oxygen saturations>88%;To learn and demonstrate proper pursed lip breathing techniques or other breathing techniques. ;To learn and demonstrate proper use of respiratory medications To learn and understand importance of monitoring SPO2 with pulse oximeter and demonstrate accurate use of the pulse oximeter.;To learn and understand importance of maintaining oxygen saturations>88%;To learn and demonstrate proper pursed lip breathing techniques or other breathing techniques. ;To learn and demonstrate proper use of respiratory medications To learn and understand importance of monitoring SPO2 with pulse oximeter and demonstrate accurate use of the pulse oximeter.;To learn and understand importance of maintaining oxygen saturations>88%;To learn  and demonstrate proper pursed lip breathing techniques or other breathing techniques. ;To learn and demonstrate proper use of respiratory medications   Long  Term Goals -- -- Verbalizes importance of monitoring SPO2 with pulse oximeter and return demonstration;Maintenance of O2 saturations>88%;Compliance with respiratory medication;Exhibits proper breathing techniques, such as pursed lip breathing or other method taught during program session Verbalizes importance of monitoring SPO2 with pulse oximeter and return demonstration;Maintenance of O2 saturations>88%;Compliance with respiratory medication;Exhibits proper breathing techniques, such as pursed lip breathing or other method taught during program session Verbalizes importance of monitoring SPO2 with pulse oximeter and return demonstration;Maintenance of O2 saturations>88%;Compliance with respiratory medication;Exhibits proper breathing techniques, such as pursed  lip breathing or other method taught during program session   Comments Reviewed PLB technique with pt.  Talked about how it works and it's importance in maintaining their exercise saturations. Camrie is doing well in the program so far. Her breathing is staying WNL when exrcising 88-90s. She feels her breathing improving each week and her energy increaing as well. She does breath through her mouth and is trying PLB. Tondra is doing well in rehab.  Her breathing is doing better and she is feelng better overall.  Her sats conitnue to drop some with exercise but she does not feel too winded. She is good about using her PLB to get it to recover.  Her family has also noticed a difference in her breathing too. Mikaili is doing well in rehab. Her breathing is getting better, but her saturations continue to be on the lower side.  She is going to doctor on Wednesday and willl take her report with her that day.  She will talk to them about her numbers.  She has been using her PLB to help with breathing.  She is pleased with her improvement and progress. Justines oxygen does continue to drop during exreicse and talking to the 85-86% It does increase when she does PLB and takes deep breaths. She stated that she feels like her breathing has improved.   Goals/Expected Outcomes Short: Become more profiecient at using PLB. Long: Become independent at using PLB. Short : continue to foucse on PLB when exercign    long: continue to monitor breathing and oxygen stats when exercising Short: Conitnue to use to help with PLB.  Long: Continue to monitor saturations closely. Short: Take saturations to doctor on Wednesday Long; Continue to improve breathing. Short: monitor oxygen stats   long term: contiue to work on PLB when exercising            Oxygen Discharge (Final Oxygen Re-Evaluation):  Oxygen Re-Evaluation - 09/02/23 0834       Program Oxygen Prescription   Program Oxygen Prescription None      Home Oxygen   Home  Oxygen Device None    Sleep Oxygen Prescription None    Home Exercise Oxygen Prescription None    Home Resting Oxygen Prescription None      Goals/Expected Outcomes   Short Term Goals To learn and understand importance of monitoring SPO2 with pulse oximeter and demonstrate accurate use of the pulse oximeter.;To learn and understand importance of maintaining oxygen saturations>88%;To learn and demonstrate proper pursed lip breathing techniques or other breathing techniques. ;To learn and demonstrate proper use of respiratory medications    Long  Term Goals Verbalizes importance of monitoring SPO2 with pulse oximeter and return demonstration;Maintenance of O2 saturations>88%;Compliance with respiratory medication;Exhibits proper breathing techniques, such as pursed lip breathing or other method taught during program session  Comments Justines oxygen does continue to drop during exreicse and talking to the 85-86% It does increase when she does PLB and takes deep breaths. She stated that she feels like her breathing has improved.    Goals/Expected Outcomes Short: monitor oxygen stats   long term: contiue to work on PLB when exercising             Initial Exercise Prescription:  Initial Exercise Prescription - 06/04/23 1000       Date of Initial Exercise RX and Referring Provider   Date 06/04/23    Referring Provider Dr. Vassie Loll      Oxygen   Maintain Oxygen Saturation 88% or higher      Treadmill   MPH 2    Grade 0    Minutes 15      Recumbant Elliptical   Level 1    RPM 60    Minutes 15      Prescription Details   Frequency (times per week) 2    Duration Progress to 30 minutes of continuous aerobic without signs/symptoms of physical distress      Intensity   THRR 40-80% of Max Heartrate 103-138    Ratings of Perceived Exertion 11-13    Perceived Dyspnea 0-4      Resistance Training   Training Prescription Yes    Weight 3    Reps 10-15             Perform  Capillary Blood Glucose checks as needed.  Exercise Prescription Changes:   Exercise Prescription Changes     Row Name 06/15/23 1300 07/01/23 1300 07/06/23 0800 07/13/23 1200 07/27/23 1200     Response to Exercise   Blood Pressure (Admit) 118/70 122/68 -- 124/64 118/60   Blood Pressure (Exercise) 126/70 132/70 -- -- --   Blood Pressure (Exit) 100/70 112/64 -- 110/60 110/60   Heart Rate (Admit) 73 bpm 70 bpm -- 71 bpm 67 bpm   Heart Rate (Exercise) 85 bpm 93 bpm -- 90 bpm 90 bpm   Heart Rate (Exit) 75 bpm 81 bpm -- 74 bpm 79 bpm   Oxygen Saturation (Admit) 95 % 93 % -- 91 % 92 %   Oxygen Saturation (Exercise) 88 % 90 % -- 88 % 88 %   Oxygen Saturation (Exit) 94 % 91 % -- 94 % 94 %   Rating of Perceived Exertion (Exercise) 12 12 -- 12 12   Perceived Dyspnea (Exercise) 1 1 -- 2 2   Duration Continue with 30 min of aerobic exercise without signs/symptoms of physical distress. Continue with 30 min of aerobic exercise without signs/symptoms of physical distress. -- Continue with 30 min of aerobic exercise without signs/symptoms of physical distress. Continue with 30 min of aerobic exercise without signs/symptoms of physical distress.   Intensity THRR unchanged THRR unchanged -- THRR unchanged THRR unchanged     Progression   Progression Continue to progress workloads to maintain intensity without signs/symptoms of physical distress. Continue to progress workloads to maintain intensity without signs/symptoms of physical distress. -- Continue to progress workloads to maintain intensity without signs/symptoms of physical distress. Continue to progress workloads to maintain intensity without signs/symptoms of physical distress.     Resistance Training   Training Prescription Yes Yes -- Yes Yes   Weight 3 lbs 3 lbs -- 3 lbs / blue band 3 lbs / blue band   Reps 10-15 10-15 -- 10-15 10-15   Time 1 Minutes -- -- -- --  Treadmill   MPH 2 2.2 -- 2 1.7   Grade 0 0.5 -- 0.5 0   Minutes 15 15 --  15 15   METs 2.53 2.84 -- 2.67 2.3     Recumbant Elliptical   Level 3 3 -- 3 3   RPM 47 38 -- 45 47   Minutes 15 15 -- 15 15   METs 2.3 2.5 -- 3.1 3.1     Home Exercise Plan   Plans to continue exercise at -- -- Home (comment)  walking, [employer] Home (comment) Home (comment)   Frequency -- -- Add 2 additional days to program exercise sessions. Add 2 additional days to program exercise sessions. Add 2 additional days to program exercise sessions.   Initial Home Exercises Provided -- -- 07/06/23 -- --     Oxygen   Maintain Oxygen Saturation 88% or higher 88% or higher -- 88% or higher 88% or higher    Row Name 08/10/23 1500 09/07/23 1300 09/21/23 1200         Response to Exercise   Blood Pressure (Admit) 104/66 112/68 120/60     Blood Pressure (Exit) 102/60 120/60 128/70     Heart Rate (Admit) 72 bpm 66 bpm 64 bpm     Heart Rate (Exercise) 95 bpm 88 bpm 93 bpm     Heart Rate (Exit) 78 bpm 73 bpm 70 bpm     Oxygen Saturation (Admit) 95 % 94 % 94 %     Oxygen Saturation (Exercise) 85 % 88 % 88 %     Oxygen Saturation (Exit) 94 % 95 % 93 %     Rating of Perceived Exertion (Exercise) 13 12 12      Perceived Dyspnea (Exercise) 2 1 2      Duration Continue with 30 min of aerobic exercise without signs/symptoms of physical distress. Continue with 30 min of aerobic exercise without signs/symptoms of physical distress. Continue with 30 min of aerobic exercise without signs/symptoms of physical distress.     Intensity THRR unchanged THRR unchanged THRR unchanged       Progression   Progression Continue to progress workloads to maintain intensity without signs/symptoms of physical distress. Continue to progress workloads to maintain intensity without signs/symptoms of physical distress. Continue to progress workloads to maintain intensity without signs/symptoms of physical distress.       Resistance Training   Training Prescription Yes Yes Yes     Weight 3 lbs 3 lbs 3     Reps -- 10-15  10-15       Treadmill   MPH -- 2 2.2     Grade -- 0.5 0.5     Minutes -- 15 15     METs -- 2.67 2.84       Recumbant Elliptical   Level -- 3 3     RPM -- 47 52     Minutes -- 15 15     METs -- 3.4 4.9       Home Exercise Plan   Plans to continue exercise at -- Home (comment) Home (comment)     Frequency -- Add 2 additional days to program exercise sessions. Add 2 additional days to program exercise sessions.       Oxygen   Maintain Oxygen Saturation -- 88% or higher 88% or higher              Exercise Comments:   Exercise Comments     Row Name 06/17/23 [phone]  Exercise Comments Reviewed home exercise                Exercise Goals and Review:   Exercise Goals     Row Name 06/04/23 1005             Exercise Goals   Increase Physical Activity Yes       Intervention Provide advice, education, support and counseling about physical activity/exercise needs.;Develop an individualized exercise prescription for aerobic and resistive training based on initial evaluation findings, risk stratification, comorbidities and participant's personal goals.       Expected Outcomes Short Term: Attend rehab on a regular basis to increase amount of physical activity.;Long Term: Add in home exercise to make exercise part of routine and to increase amount of physical activity.;Long Term: Exercising regularly at least 3-5 days a week.       Increase Strength and Stamina Yes       Intervention Provide advice, education, support and counseling about physical activity/exercise needs.;Develop an individualized exercise prescription for aerobic and resistive training based on initial evaluation findings, risk stratification, comorbidities and participant's personal goals.       Expected Outcomes Short Term: Increase workloads from initial exercise prescription for resistance, speed, and METs.;Short Term: Perform resistance training exercises routinely during rehab and add in  resistance training at home;Long Term: Improve cardiorespiratory fitness, muscular endurance and strength as measured by increased METs and functional capacity ( )       Able to understand and use rate of perceived exertion (RPE) scale Yes       Intervention Provide education and explanation on how to use RPE scale       Expected Outcomes Short Term: Able to use RPE daily in rehab to express subjective intensity level;Long Term:  Able to use RPE to guide intensity level when exercising independently       Able to understand and use Dyspnea scale Yes       Intervention Provide education and explanation on how to use Dyspnea scale       Expected Outcomes Short Term: Able to use Dyspnea scale daily in rehab to express subjective sense of shortness of breath during exertion;Long Term: Able to use Dyspnea scale to guide intensity level when exercising independently       Knowledge and understanding of Target Heart Rate Range (THRR) Yes       Intervention Provide education and explanation of THRR including how the numbers were predicted and where they are located for reference       Expected Outcomes Short Term: Able to use daily as guideline for intensity in rehab;Short Term: Able to state/look up THRR;Long Term: Able to use THRR to govern intensity when exercising independently       Able to check pulse independently Yes       Intervention Provide education and demonstration on how to check pulse in carotid and radial arteries.;Review the importance of being able to check your own pulse for safety during independent exercise       Expected Outcomes Short Term: Able to explain why pulse checking is important during independent exercise;Long Term: Able to check pulse independently and accurately       Understanding of Exercise Prescription Yes       Intervention Provide education, explanation, and written materials on patient's individual exercise prescription       Expected Outcomes Short Term: Able to  explain program exercise prescription;Long Term: Able to explain home exercise prescription to exercise independently  Exercise Goals Re-Evaluation :  Exercise Goals Re-Evaluation     Row Name 06/08/23 0800 06/15/23 0757 06/15/23 1332 07/01/23 1312 07/06/23 0759     Exercise Goal Re-Evaluation   Exercise Goals Review Increase Physical Activity;Increase Strength and Stamina;Able to understand and use Dyspnea scale;Understanding of Exercise Prescription Increase Physical Activity;Increase Strength and Stamina;Understanding of Exercise Prescription Increase Physical Activity;Increase Strength and Stamina;Understanding of Exercise Prescription Increase Physical Activity;Increase Strength and Stamina;Understanding of Exercise Prescription Increase Physical Activity;Increase Strength and Stamina;Understanding of Exercise Prescription   Comments Reviewed RPE  and dyspnea scale, THR and program prescription with pt today.  Pt voiced understanding and was given a copy of goals to take home. Kerry-Anne has just started the program in the past week. She has been doing great in the program so far. She continues to walk everyday. She stated that she feels better and has more energy. She enjoys coming to rehab and aslo likes the social aspect. Naasia has tolerated exercise well. She is increaing her level on the XR to level 3. She is also walking everyday. Will continue to monitor and progress as able. Petrea has been tolerating exericse well, She has inceased her speed and incline on the treadmill in the last week to 2.2/0.5. Will continue to monitor and progress as able. Adalia is doing well in rehab.  She is starting to improve her stamina.  Her breathing is starting to improve.  Reviewed home exercise with pt today.  Pt plans to walk and use [employer] at home for exercise.  She also has a exercise bike in her basement.  Reviewed THR, pulse, RPE, sign and symptoms, pulse oximetery and when to  call 911 or MD.  Also discussed weather considerations and indoor options.  Pt voiced understanding.   Expected Outcomes Short: Use RPE daily to regulate intensity. Long: Follow program prescription in THR. Short term: go over home exercise   long term: continue to attend rehab Short: continue to exercise at RPE of 12 then increase workload    Long term: continue to attend rehab Short term: continue to exerise at workload and increase when ready   long term: continue to attend rehab Short: start to add in more exercise at home. Long: conintue to improve stamina.    Row Name 07/28/23 0737 08/03/23 0758 09/02/23 0818 09/22/23 0708 10/09/23 1347     Exercise Goal Re-Evaluation   Exercise Goals Review Increase Physical Activity;Able to understand and use rate of perceived exertion (RPE) scale;Understanding of Exercise Prescription Increase Physical Activity;Increase Strength and Stamina;Understanding of Exercise Prescription Increase Physical Activity;Increase Strength and Stamina;Understanding of Exercise Prescription Increase Physical Activity;Increase Strength and Stamina;Understanding of Exercise Prescription Increase Physical Activity;Increase Strength and Stamina;Understanding of Exercise Prescription   Comments Kynlei is doing well in rehab. She has increased her level on the XR to level 3 and is pushing herself. She is focusing on her oxygen and PLB. She is currently exercising at 3.1 METs. Will continue to monitor and progress as able. Krystn is doing doing well in rehab. She is walking and using her cardioglide on her off days. She is also doing weights and stretches each day.  She aims for 25-30 min each day.  She is looking forward to seeing her doctor on Wednesday.  He is hoping he will be pleasantly surprised. Chameka is doing well in rehab. She continues to exericse outside of rehab by walking, weights and stretching. She states that she has more engery to do her everyday activities. Monzerrath is  doing well in rehab. She is tolerating exercise and is is setting goals on the XR for how many miles she rides each class. On the treadmill her oxygen does tend to drop to 86-88 but goes back up when she stops and does PLB. She is on her 30th visit so she is almost done with the program. Will continue to monitor and progress as able. Brennah has not attend rehab since 09/21/2023, she stated that it is too cold for her in the mornings to go outside. Will update when able.   Expected Outcomes Short: increase walking speed on treadmill   long: continue to attend rehab Short: Continue to exercise on her off days Long: Conitnue to improve stamina Short: Continue to exercise on her off days Long: Conitnue to improve stamina Short: Continue to exercise on her off days Long: Conitnue to improve stamina --            Discharge Exercise Prescription (Final Exercise Prescription Changes):  Exercise Prescription Changes - 09/21/23 1200       Response to Exercise   Blood Pressure (Admit) 120/60    Blood Pressure (Exit) 128/70    Heart Rate (Admit) 64 bpm    Heart Rate (Exercise) 93 bpm    Heart Rate (Exit) 70 bpm    Oxygen Saturation (Admit) 94 %    Oxygen Saturation (Exercise) 88 %    Oxygen Saturation (Exit) 93 %    Rating of Perceived Exertion (Exercise) 12    Perceived Dyspnea (Exercise) 2    Duration Continue with 30 min of aerobic exercise without signs/symptoms of physical distress.    Intensity THRR unchanged      Progression   Progression Continue to progress workloads to maintain intensity without signs/symptoms of physical distress.      Resistance Training   Training Prescription Yes    Weight 3    Reps 10-15      Treadmill   MPH 2.2    Grade 0.5    Minutes 15    METs 2.84      Recumbant Elliptical   Level 3    RPM 52    Minutes 15    METs 4.9      Home Exercise Plan   Plans to continue exercise at Home (comment)    Frequency Add 2 additional days to program exercise  sessions.      Oxygen   Maintain Oxygen Saturation 88% or higher             Nutrition:  Target Goals: Understanding of nutrition guidelines, daily intake of sodium 1500mg , cholesterol 200mg , calories 30% from fat and 7% or less from saturated fats, daily to have 5 or more servings of fruits and vegetables.  Biometrics:  Pre Biometrics - 06/04/23 1006       Pre Biometrics   Height 5' 1.5" (1.562 m)    Weight 142 lb 10.2 oz (64.7 kg)    Waist Circumference 33.5 inches    Hip Circumference 38 inches    Waist to Hip Ratio 0.88 %    BMI (Calculated) 26.52    Grip Strength 25.3 kg              Nutrition Therapy Plan and Nutrition Goals:   Nutrition Assessments:  MEDIFICTS Score Key: >=70 Need to make dietary changes  40-70 Heart Healthy Diet <= 40 Therapeutic Level Cholesterol Diet  Flowsheet Row PULMONARY REHAB CHRONIC OBSTRUCTIVE PULMONARY DISEASE from 06/08/2023 in [place] [facility]  Picture Your Plate Total Score on Admission 13      Picture Your Plate Scores: <16 Unhealthy dietary pattern with much room for improvement. 41-50 Dietary pattern unlikely to meet recommendations for good health and room for improvement. 51-60 More healthful dietary pattern, with some room for improvement.  >60 Healthy dietary pattern, although there may be some specific behaviors that could be improved.    Nutrition Goals Re-Evaluation:  Nutrition Goals Re-Evaluation     Row Name 06/15/23 [phone] 07/06/23 0808 08/03/23 0803 09/02/23 0829       Goals   Nutrition Goal Healthy eating Short term: continue to cut down on chips long term: continue to eat healthy Short: Conitnue to stay away from bad foods and splurge on occassion Long; conitnue to eat healthy Healthy eating    Comment Fatimata has been working on her healthy eating. She has started to cook veggie and has learned she likes peppers. She eats more chicken within the week and it is normally baked or  grilled. She stated that she does not eat a lot of sweets. She is happy with her progress in healthy eating. Analicia has been working on her diet.  She has been staying away from chips and sweets.  It is starting to pay off as her weight is starting to come down.  She is getting lots of fruits and vegetables.  She is pleased with weight coming off.  She says she is feeling better.  She is staying away from soda and milk and sticking to green tea and water.  She says she feels better overall with her diet now. Vernon continues to work on her diet.  She is staying away from the "bad foods" and sticks with her healthier snacks.  She does have a little debbie on occassion, but trying to stay away.  She has been enjoying the clemintines as a snack.  She contineus to stay away from chips too. Minami continues to work on her diet. She has been cutting back on her sweets and picking healthier snacks. She stated that she has lost some weight and is watching the calories in what she eats.    Expected Outcome Short term: continue to cut down on chips   long term: continue to eat healthy Short: Conitnue to stay away from bad foods and splurge on occassion Long; conitnue to eat healthy short: Continue to sway out for healthy snacks Long: Continue to focus on healthy eating habits Short: Continues to pick healthy snacks   long term: foacus on healtjy eating             Nutrition Goals Discharge (Final Nutrition Goals Re-Evaluation):  Nutrition Goals Re-Evaluation - 09/02/23 0829       Goals   Nutrition Goal Healthy eating    Comment Kaelen continues to work on her diet. She has been cutting back on her sweets and picking healthier snacks. She stated that she has lost some weight and is watching the calories in what she eats.    Expected Outcome Short: Continues to pick healthy snacks   long term: foacus on healtjy eating             Psychosocial: Target Goals: Acknowledge presence or absence of  significant depression and/or stress, maximize coping skills, provide positive support system. Participant is able to verbalize types and ability to use techniques and skills needed for reducing stress and depression.  Initial Review & Psychosocial Screening:  Initial Psych Review & Screening - 05/29/23  [id number]       Initial Review   Current issues with Current Sleep Concerns      Family Dynamics   Good Support System? Yes      Barriers   Psychosocial barriers to participate in program There are no identifiable barriers or psychosocial needs.      Screening Interventions   Interventions Encouraged to exercise;To provide support and resources with identified psychosocial needs;Provide feedback about the scores to participant    Expected Outcomes Short Term goal: Utilizing psychosocial counselor, staff and physician to assist with identification of specific Stressors or current issues interfering with healing process. Setting desired goal for each stressor or current issue identified.;Long Term Goal: Stressors or current issues are controlled or eliminated.;Short Term goal: Identification and review with participant of any Quality of Life or Depression concerns found by scoring the questionnaire.;Long Term goal: The participant improves quality of Life and PHQ9 Scores as seen by post scores and/or verbalization of changes             Quality of Life Scores:  Scores of 19 and below usually indicate a poorer quality of life in these areas.  A difference of  2-3 points is a clinically meaningful difference.  A difference of 2-3 points in the total score of the Quality of Life Index has been associated with significant improvement in overall quality of life, self-image, physical symptoms, and general health in studies assessing change in quality of life.   PHQ-9: Review Flowsheet  More data exists      09/03/2023 08/05/2023 06/04/2023 03/05/2023 02/12/2023  Depression screen PHQ 2/9   Decreased Interest 0 0 0 0 0  Down, Depressed, Hopeless 0 0 0 0 0  PHQ - 2 Score 0 0 0 0 0  Altered sleeping 0 1 1 0 -  Tired, decreased energy 0 0 0 1 -  Change in appetite 0 0 0 0 -  Feeling bad or failure about yourself  0 0 0 0 -  Trouble concentrating 0 0 0 0 -  Moving slowly or fidgety/restless 0 0 0 0 -  Suicidal thoughts 0 0 0 0 -  PHQ-9 Score 0 1 1 1  -  Difficult doing work/chores Not difficult at all - Not difficult at all Somewhat difficult -   Interpretation of Total Score  Total Score Depression Severity:  1-4 = Minimal depression, 5-9 = Mild depression, 10-14 = Moderate depression, 15-19 = Moderately severe depression, 20-27 = Severe depression   Psychosocial Evaluation and Intervention:  Psychosocial Evaluation - 05/29/23 0936       Psychosocial Evaluation & Interventions   Interventions Stress management education;Relaxation education;Encouraged to exercise with the program and follow exercise prescription    Comments 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 She lives with her husband of 42 years and they have a son and 2 grandchildren. Her husband and grandchildren are her main support people. She tries to live a very active life but has not been able to do as much as she would like recently due to her advancing COPD. She does walk everyday 30 mins. She denies any depression, anxiety or stress. She does have trouble staying asleep some nights. She recently started taking benadryl and was taking the generic brand from [facility]. She did not realize she was taking benadryl and ask if this was safe.  I advised her to speak with her pcp about her concerns about taking benadryl. She has tried melotonin in the past and  it did not help. She said she had been reluctant to participate in the program for a long time eventhough Dr. Vassie Loll had recommended it but she saw a video on you-tube about pulmonary rehabilitation and its benefits and decided to give it a try. She  does have a $15 dollar copayment and she was not sure if this was going to be a barrier but said she wanted to do as much as she could of the program. No other barriers identified. Her main goals for the program are to get her lungs stronger and be able to do all the activities she wants to do. She wants to be able to help her husband mow the yard. She said she was not able to go out onto the beach with her grandchildren this summer for the first time since they were born, 14 years, due to her SOB and this really brothered her. She would like to get her lungs strong enough to participate in these activities.    Expected Outcomes Short Term: Start and attend the program consistently. Long Term: Get her lung stronger and be able to do all the activities she wants to do.    Continue Psychosocial Services  Follow up required by staff             Psychosocial Re-Evaluation:  Psychosocial Re-Evaluation     Row Name 06/15/23 0806 07/06/23 0811 08/03/23 0801 09/02/23 0820       Psychosocial Re-Evaluation   Current issues with Current Sleep Concerns Current Sleep Concerns Current Sleep Concerns Current Sleep Concerns    Comments Shabana said that she was taking an sleep aid she got from [employer] and when talking to her PCP they noticed it was a form of benadryl. She has started to not take anything for sleep. She said she will go to sleep pretty fast but will wake up within 2 hours and her mind will start racing. SHe has no other barriors Karris is doing well in rehab.  She had a hard time sleeping last night. She has a hard time calming her mind and staying awake from husband keeping her awake with his snoring.  Otherwise, she is feeling good mentally and doing well.  She has gotten used to the lack of sleep and adjust each day.  She is generally a positive person and enjoys coming to exercise. Malai is doing well in rehab.  She is feeling good mentally.  She is enjoying that she is feeling better and  more active again. She does admit to a short fuse and prays about it.  She denies any stressors.  She continues to just get her 3 hrs of sleep with naps in the afternoon. She feels like it has gotten a little better with exercise.  She is enjoying rehab and getting out with her classmates. Lashell is well in rehab. SHe is feeling good overall and enjoys coming to rehab. She is being more active. She denies any stressors. She is still napping during the day. She feels better overall with exericse.    Expected Outcomes Shor term: continue to work on sleep    Long term: continue to have no barriors Short: Continue to work on sleep Long: conitnue to stay positive. Short: Continue to exercise for mental boost and sleep Long: Conitnue to focus on positives. Short: Continue to exercise for mental boost and sleep Long: Conitnue to focus on positives.    Interventions Stress management education;Relaxation education;Encouraged to attend Pulmonary Rehabilitation for the  exercise Stress management education;Relaxation education;Encouraged to attend Pulmonary Rehabilitation for the exercise Stress management education;Relaxation education;Encouraged to attend Pulmonary Rehabilitation for the exercise Stress management education;Relaxation education;Encouraged to attend Pulmonary Rehabilitation for the exercise    Continue Psychosocial Services  Follow up required by staff Follow up required by staff Follow up required by staff Follow up required by staff             Psychosocial Discharge (Final Psychosocial Re-Evaluation):  Psychosocial Re-Evaluation - 09/02/23 0820       Psychosocial Re-Evaluation   Current issues with Current Sleep Concerns    Comments Tranika is well in rehab. SHe is feeling good overall and enjoys coming to rehab. She is being more active. She denies any stressors. She is still napping during the day. She feels better overall with exericse.    Expected Outcomes Short: Continue to exercise  for mental boost and sleep Long: Conitnue to focus on positives.    Interventions Stress management education;Relaxation education;Encouraged to attend Pulmonary Rehabilitation for the exercise    Continue Psychosocial Services  Follow up required by staff              Education: Education Goals: Education classes will be provided on a weekly basis, covering required topics. Participant will state understanding/return demonstration of topics presented.  Learning Barriers/Preferences:  Learning Barriers/Preferences - 05/29/23 0935       Learning Barriers/Preferences   Learning Barriers None    Learning Preferences Audio;Written Material             Education Topics: How Lungs Work and Diseases: - Discuss the anatomy of the lungs and diseases that can affect the lungs, such as COPD. Flowsheet Row PULMONARY REHAB CHRONIC OBSTRUCTIVE PULMONARY DISEASE from 09/02/2023 in [place] [facility]  Date 08/05/23  Educator [facility]  Instruction Review Code 1- Verbalizes Understanding       Exercise: -Discuss the importance of exercise, FITT principles of exercise, normal and abnormal responses to exercise, and how to exercise safely.   Environmental Irritants: -Discuss types of environmental irritants and how to limit exposure to environmental irritants.   Meds/Inhalers and oxygen: - Discuss respiratory medications, definition of an inhaler and oxygen, and the proper way to use an inhaler and oxygen.   Energy Saving Techniques: - Discuss methods to conserve energy and decrease shortness of breath when performing activities of daily living.    Bronchial Hygiene / Breathing Techniques: - Discuss breathing mechanics, pursed-lip breathing technique,  proper posture, effective ways to clear airways, and other functional breathing techniques   Cleaning Equipment: - Provides group verbal and written instruction about the health risks of elevated stress, cause of high  stress, and healthy ways to reduce stress.   Nutrition I: Fats: - Discuss the types of cholesterol, what cholesterol does to the body, and how cholesterol levels can be controlled. Flowsheet Row PULMONARY REHAB CHRONIC OBSTRUCTIVE PULMONARY DISEASE from 09/02/2023 in [place] [facility]  Date 07/08/23  Educator [facility]  Instruction Review Code 1- Verbalizes Understanding       Nutrition II: Labels: -Discuss the different components of food labels and how to read food labels. Flowsheet Row PULMONARY REHAB CHRONIC OBSTRUCTIVE PULMONARY DISEASE from 09/02/2023 in [place] [facility]  Date 07/08/23  Educator [facility]  Instruction Review Code 1- Verbalizes Understanding       Respiratory Infections: - Discuss the signs and symptoms of respiratory infections, ways to prevent respiratory infections, and the importance of seeking medical treatment when having a  respiratory infection.   Stress I: Signs and Symptoms: - Discuss the causes of stress, how stress may lead to anxiety and depression, and ways to limit stress. Flowsheet Row PULMONARY REHAB CHRONIC OBSTRUCTIVE PULMONARY DISEASE from 09/02/2023 in [place] [facility]  Date 07/15/23  Educator [facility]  Instruction Review Code 1- Verbalizes Understanding       Stress II: Relaxation: -Discuss relaxation techniques to limit stress. Flowsheet Row PULMONARY REHAB CHRONIC OBSTRUCTIVE PULMONARY DISEASE from 09/02/2023 in [place] [facility]  Date 07/15/23  Educator [facility]  Instruction Review Code 1- Verbalizes Understanding       Oxygen for Home/Travel: - Discuss how to prepare for travel when on oxygen and proper ways to transport and store oxygen to ensure safety.   Knowledge Questionnaire Score:  Knowledge Questionnaire Score - 06/08/23 0812       Knowledge Questionnaire Score   Pre Score 15/18             Core Components/Risk Factors/Patient Goals at Admission:  Personal  Goals and Risk Factors at Admission - 05/29/23 0935       Core Components/Risk Factors/Patient Goals on Admission    Weight Management Weight Maintenance    Improve shortness of breath with ADL's Yes    Intervention Provide education, individualized exercise plan and daily activity instruction to help decrease symptoms of SOB with activities of daily living.    Expected Outcomes Short Term: Improve cardiorespiratory fitness to achieve a reduction of symptoms when performing ADLs;Long Term: Be able to perform more ADLs without symptoms or delay the onset of symptoms    Increase knowledge of respiratory medications and ability to use respiratory devices properly  Yes    Intervention Provide education and demonstration as needed of appropriate use of medications, inhalers, and oxygen therapy.    Expected Outcomes Short Term: Achieves understanding of medications use. Understands that oxygen is a medication prescribed by physician. Demonstrates appropriate use of inhaler and oxygen therapy.;Long Term: Maintain appropriate use of medications, inhalers, and oxygen therapy.    Hypertension Yes    Intervention Provide education on lifestyle modifcations including regular physical activity/exercise, weight management, moderate sodium restriction and increased consumption of fresh fruit, vegetables, and low fat dairy, alcohol moderation, and smoking cessation.;Monitor prescription use compliance.    Expected Outcomes Short Term: Continued assessment and intervention until BP is < 140/75mm HG in hypertensive participants. < 130/3mm HG in hypertensive participants with diabetes, heart failure or chronic kidney disease.;Long Term: Maintenance of blood pressure at goal levels.    Lipids Yes    Intervention Provide education and support for participant on nutrition & aerobic/resistive exercise along with prescribed medications to achieve LDL 70mg , HDL >40mg .    Expected Outcomes Short Term: Participant states  understanding of desired cholesterol values and is compliant with medications prescribed. Participant is following exercise prescription and nutrition guidelines.;Long Term: Cholesterol controlled with medications as prescribed, with individualized exercise RX and with personalized nutrition plan. Value goals: LDL < 70mg , HDL > 40 mg.             Core Components/Risk Factors/Patient Goals Review:   Goals and Risk Factor Review     Row Name 06/15/23 4782 07/06/23 0806 08/03/23 0805 09/02/23 0832       Core Components/Risk Factors/Patient Goals Review   Personal Goals Review Improve shortness of breath with ADL's;Weight Management/Obesity Improve shortness of breath with ADL's;Weight Management/Obesity;Increase knowledge of respiratory medications and ability to use respiratory devices properly. Improve shortness of breath with ADL's;Weight  Management/Obesity;Increase knowledge of respiratory medications and ability to use respiratory devices properly.;Hypertension Improve shortness of breath with ADL's;Weight Management/Obesity;Increase knowledge of respiratory medications and ability to use respiratory devices properly.;Hypertension    Review Liyanna is doing good with exercise. She is working on [employer] with exercising by walking everyday and eating healthy. She stated that she wants to keep around the same weight she is and does not want to gain. She is also focusing on PLB to help improve her SOB. She has noticed she breaths through her mouth a lot and is trying to remeber PLB. She stated that she has felt better since exercising. Lismary is doing well in rehab.  She feels like her breathing is getting better overall.  She is able to do more with less SOB.  She does still see her saturations dropping but they do recover with PLB.  She is doing well on her meds.  She was excited about her weight today.  She was under 140 lb today!!  She is doing better with her diet and it is finally  starting to pay off. Kiri is doing well in rehab.  Her weight continues to come down.  She is pleased that it is showing on scale.  She goes to doctor on Wednesday and eager to show off her progress in rehab.  Her breathing has gotten better but her saturations are still on the lower side.  She is doing well on her meds.  Her blood pressures have been good.  She is going to take report with her to her appt. Kaisley is doing well in rehab. Her weight as came down some and she is pleased. She is breathing a little better during exericse she states but her stats are lower in the 86-88%. Thye drop whe she is talking while exericsing the most. Her blood pressure continues to be [employer].    Expected Outcomes Short term: comtinue to focuse of PLB and weight managment   long term: continue to enjoy exercising and walking at home Short: Conitnue to work on breathing Long: Conitnue to work on [employer] loss Short: Take her report to doctor on Wednesday Long: Continue to work on breathing Short: continue to montior her oxygen stats  long term: use PLB when exericing and montiro BP             Core Components/Risk Factors/Patient Goals at Discharge (Final Review):   Goals and Risk Factor Review - 09/02/23 0832       Core Components/Risk Factors/Patient Goals Review   Personal Goals Review Improve shortness of breath with ADL's;Weight Management/Obesity;Increase knowledge of respiratory medications and ability to use respiratory devices properly.;Hypertension    Review Jae is doing well in rehab. Her weight as came down some and she is pleased. She is breathing a little better during exericse she states but her stats are lower in the 86-88%. Thye drop whe she is talking while exericsing the most. Her blood pressure continues to be [employer].    Expected Outcomes Short: continue to montior her oxygen stats  long term: use PLB when exericing and montiro BP             ITP Comments:  ITP Comments     Row Name  06/08/23 0759 07/01/23 0643 07/29/23 0647 08/26/23 0810 09/22/23 0734   ITP Comments First full day of exercise!  Patient was oriented to gym and equipment including functions, settings, policies, and procedures.  Patient's individual exercise prescription and treatment plan were reviewed.  All starting workloads were established based on the results of the 6 minute walk test done at initial orientation visit.  The plan for exercise progression was also introduced and progression will be customized based on patient's performance and goals 30 day review completed. ITP sent to Dr.Jehanzeb Memon, Medical Director of  Pulmonary Rehab. Continue with ITP unless changes are made by physician. 30 day review completed. ITP sent to Dr.Jehanzeb Memon, Medical Director of  Pulmonary Rehab. Continue with ITP unless changes are made by physician. 30 day review completed. ITP sent to Dr.Jehanzeb Memon, Medical Director of  Pulmonary Rehab. Continue with ITP unless changes are made by physician. 30 day review completed. ITP sent to Dr.Jehanzeb Memon, Medical Director of  Pulmonary Rehab. Continue with ITP unless changes are made by physician.    Row Name 10/21/23 0803 10/21/23 0820         ITP Comments First time returning in January.  Has not attened since 09/21/23. 30 day review completed. ITP sent to Dr.Jehanzeb Memon, Medical Director of  Pulmonary Rehab. Continue with ITP unless changes are made by physician.  Pt has not attended this month until today.  Unable to assess for goals.               Comments: 30 day review

## 2023-10-23 ENCOUNTER — Encounter (HOSPITAL_COMMUNITY)
Admission: RE | Admit: 2023-10-23 | Discharge: 2023-10-23 | Disposition: A | Discharge status: Home / Self Care | Payer: Medicare Other | Source: Ambulatory Visit | Attending: Pulmonary Disease | Admitting: Pulmonary Disease

## 2023-10-23 DIAGNOSIS — J4489 Other specified chronic obstructive pulmonary disease: Secondary | ICD-10-CM

## 2023-10-23 NOTE — Progress Notes (Signed)
Daily Session Note  Patient Details  Name: [name] MRN: [id number] Date of Birth: 1960/11/12 Referring Provider:   Flowsheet Row PULMONARY REHAB COPD ORIENTATION from 06/04/2023 in [facility] CARDIAC REHABILITATION  Referring Provider Dr. Vassie Loll       Encounter Date: 10/23/2023  Check In:  Session Check In - 10/23/23 0759       Check-In   Supervising physician immediately available to respond to emergencies See telemetry face sheet for immediately available MD    Location [facility]    Staff Present Fabio Pierce, MA, RCEP, CCRP, CCET;Heather Fredric Mare, [place], Exercise Physiologist;Tina Jake Shark, RN;Debra Laural Benes, RN, BSN    Virtual Visit No    Medication changes reported     No    Fall or balance concerns reported    No    Warm-up and Cool-down Performed on first and last piece of equipment    Resistance Training Performed Yes    VAD Patient? No    PAD/SET Patient? No      Pain Assessment   Currently in Pain? No/denies             Capillary Blood Glucose: No results found for this or any previous visit (from the past 24 hours).    Social History   Tobacco Use  Smoking Status Former   Current packs/day: 0.00   Average packs/day: 3.0 packs/day for 33.0 years (99.0 ttl pk-yrs)   Types: Cigarettes   Start date: 10/27/1972   Quit date: 2007   Years since quitting: 18.0  Smokeless Tobacco Never    Goals Met:  Proper associated with RPD/PD & O2 Sat Independence with exercise equipment Using PLB without cueing & demonstrates good technique Exercise tolerated well No report of concerns or symptoms today Strength training completed today  Goals Unmet:  Not Applicable  Comments: Pt able to follow exercise prescription today without complaint.  Will continue to monitor for progression.

## 2023-10-26 ENCOUNTER — Encounter (HOSPITAL_COMMUNITY)
Admission: RE | Admit: 2023-10-26 | Discharge: 2023-10-26 | Disposition: A | Discharge status: Home / Self Care | Payer: Medicare Other | Source: Ambulatory Visit | Attending: Pulmonary Disease | Admitting: Pulmonary Disease

## 2023-10-26 VITALS — Ht 61.5 in | Wt 138.0 lb

## 2023-10-26 DIAGNOSIS — J4489 Other specified chronic obstructive pulmonary disease: Secondary | ICD-10-CM | POA: Diagnosis not present

## 2023-10-26 NOTE — Progress Notes (Signed)
Daily Session Note  Patient Details  Name: [name] MRN: [id number] Date of Birth: 1961-06-29 Referring Provider:   Flowsheet Row PULMONARY REHAB COPD ORIENTATION from 06/04/2023 in [facility] CARDIAC REHABILITATION  Referring Provider Dr. Vassie Loll       Encounter Date: 10/26/2023  Check In:  Session Check In - 10/26/23 0812       Check-In   Location [facility]    Staff Present Hulen Luster, BS, RRT, CPFT;Keylin Podolsky [place], MA, RCEP, CCRP, Dow Adolph, RN, BSN    Virtual Visit No    Medication changes reported     No    Fall or balance concerns reported    No    Warm-up and Cool-down Performed on first and last piece of equipment    Resistance Training Performed Yes    VAD Patient? No    PAD/SET Patient? No      Pain Assessment   Currently in Pain? No/denies             Capillary Blood Glucose: No results found for this or any previous visit (from the past 24 hours).    Social History   Tobacco Use  Smoking Status Former   Current packs/day: 0.00   Average packs/day: 3.0 packs/day for 33.0 years (99.0 ttl pk-yrs)   Types: Cigarettes   Start date: 10/27/1972   Quit date: 2007   Years since quitting: 18.1  Smokeless Tobacco Never    Goals Met:  Proper associated with RPD/PD & O2 Sat Independence with exercise equipment Using PLB without cueing & demonstrates good technique Exercise tolerated well No report of concerns or symptoms today Strength training completed today  Goals Unmet:  Not Applicable  Comments: Pt able to follow exercise prescription today without complaint.  Will continue to monitor for progression.  6 Minute Walk     Row Name 06/04/23 1003 10/26/23 0812       6 Minute Walk   Phase Initial Discharge    Distance 1060 feet 1615 feet    Distance % Change -- 52.4 %    Distance Feet Change -- 555 ft    Walk Time 6 minutes 6 minutes    # of Rest Breaks 0 0    MPH 2.01 3.06    METS 2.66 3.69    RPE 11 11     Perceived Dyspnea  1 1    VO2 Peak 9.31 12.93    Symptoms No Yes (comment)    Comments -- SOB    Resting HR 67 bpm 74 bpm    Resting BP 110/60 100/60    Resting Oxygen Saturation  94 % 94 %    Exercise Oxygen Saturation  during 6 min walk 85 % 79 %    Max Ex. HR 90 bpm 84 bpm    Max Ex. BP 132/60 146/74    2 Minute Post BP 120/60 134/72      Interval HR   1 Minute HR 106 --    2 Minute HR 93 --    3 Minute HR 88 --    4 Minute HR 90 --    5 Minute HR 88 --    6 Minute HR 89 84    2 Minute Post HR 75 72    Interval Heart Rate? Yes Yes      Interval Oxygen   Interval Oxygen? Yes Yes    Baseline Oxygen Saturation % 94 % --    1 Minute Oxygen Saturation %  88 % --    1 Minute Liters of Oxygen 0 L --    2 Minute Oxygen Saturation % 86 % --    2 Minute Liters of Oxygen 0 L --    3 Minute Oxygen Saturation % 86 % --    3 Minute Liters of Oxygen 0 L --    4 Minute Oxygen Saturation % 85 % --    4 Minute Liters of Oxygen 0 L --    5 Minute Oxygen Saturation % 86 % --    5 Minute Liters of Oxygen 0 L --    6 Minute Oxygen Saturation % 87 % 79 %    6 Minute Liters of Oxygen 0 L 0 L    2 Minute Post Oxygen Saturation % 94 % 90 %    2 Minute Post Liters of Oxygen 0 L 0 L

## 2023-10-26 NOTE — Patient Instructions (Signed)
Discharge Patient Instructions  Patient Details  Name: [name] MRN: [id number] Date of Birth: 03/17/1961 Referring Provider:  Babs Sciara, MD   Number of Visits: 69  Reason for Discharge:  Patient reached a stable level of exercise. Patient independent in their exercise. Patient has met program and personal goals.  Smoking History:  Social History   Tobacco Use  Smoking Status Former   Current packs/day: 0.00   Average packs/day: 3.0 packs/day for 33.0 years (99.0 ttl pk-yrs)   Types: Cigarettes   Start date: 10/27/1972   Quit date: 2007   Years since quitting: 18.1  Smokeless Tobacco Never    Diagnosis:  COPD with chronic bronchitis (HCC)  Initial Exercise Prescription:  Initial Exercise Prescription - 06/04/23 1000       Date of Initial Exercise RX and Referring Provider   Date 06/04/23    Referring Provider Dr. Vassie Loll      Oxygen   Maintain Oxygen Saturation 88% or higher      Treadmill   MPH 2    Grade 0    Minutes 15      Recumbant Elliptical   Level 1    RPM 60    Minutes 15      Prescription Details   Frequency (times per week) 2    Duration Progress to 30 minutes of continuous aerobic without signs/symptoms of physical distress      Intensity   THRR 40-80% of Max Heartrate 103-138    Ratings of Perceived Exertion 11-13    Perceived Dyspnea 0-4      Resistance Training   Training Prescription Yes    Weight 3    Reps 10-15             Discharge Exercise Prescription (Final Exercise Prescription Changes):  Exercise Prescription Changes - 09/21/23 1200       Response to Exercise   Blood Pressure (Admit) 120/60    Blood Pressure (Exit) 128/70    Heart Rate (Admit) 64 bpm    Heart Rate (Exercise) 93 bpm    Heart Rate (Exit) 70 bpm    Oxygen Saturation (Admit) 94 %    Oxygen Saturation (Exercise) 88 %    Oxygen Saturation (Exit) 93 %    Rating of Perceived Exertion (Exercise) 12    Perceived Dyspnea (Exercise) 2     Duration Continue with 30 min of aerobic exercise without signs/symptoms of physical distress.    Intensity THRR unchanged      Progression   Progression Continue to progress workloads to maintain intensity without signs/symptoms of physical distress.      Resistance Training   Training Prescription Yes    Weight 3    Reps 10-15      Treadmill   MPH 2.2    Grade 0.5    Minutes 15    METs 2.84      Recumbant Elliptical   Level 3    RPM 52    Minutes 15    METs 4.9      Home Exercise Plan   Plans to continue exercise at Home (comment)    Frequency Add 2 additional days to program exercise sessions.      Oxygen   Maintain Oxygen Saturation 88% or higher             Functional Capacity:  6 Minute Walk     Row Name 06/04/23 1003 10/26/23 0812       6 Minute Walk  Phase Initial Discharge    Distance 1060 feet 1615 feet    Distance % Change -- 52.4 %    Distance Feet Change -- 555 ft    Walk Time 6 minutes 6 minutes    # of Rest Breaks 0 0    MPH 2.01 3.06    METS 2.66 3.69    RPE 11 11    Perceived Dyspnea  1 1    VO2 Peak 9.31 12.93    Symptoms No Yes (comment)    Comments -- SOB    Resting HR 67 bpm 74 bpm    Resting BP 110/60 100/60    Resting Oxygen Saturation  94 % 94 %    Exercise Oxygen Saturation  during 6 min walk 85 % 79 %    Max Ex. HR 90 bpm 84 bpm    Max Ex. BP 132/60 146/74    2 Minute Post BP 120/60 134/72      Interval HR   1 Minute HR 106 --    2 Minute HR 93 --    3 Minute HR 88 --    4 Minute HR 90 --    5 Minute HR 88 --    6 Minute HR 89 84    2 Minute Post HR 75 72    Interval Heart Rate? Yes Yes      Interval Oxygen   Interval Oxygen? Yes Yes    Baseline Oxygen Saturation % 94 % --    1 Minute Oxygen Saturation % 88 % --    1 Minute Liters of Oxygen 0 L --    2 Minute Oxygen Saturation % 86 % --    2 Minute Liters of Oxygen 0 L --    3 Minute Oxygen Saturation % 86 % --    3 Minute Liters of Oxygen 0 L --    4 Minute  Oxygen Saturation % 85 % --    4 Minute Liters of Oxygen 0 L --    5 Minute Oxygen Saturation % 86 % --    5 Minute Liters of Oxygen 0 L --    6 Minute Oxygen Saturation % 87 % 79 %    6 Minute Liters of Oxygen 0 L 0 L    2 Minute Post Oxygen Saturation % 94 % 90 %    2 Minute Post Liters of Oxygen 0 L 0 L             Nutrition & Weight - Outcomes:  Pre Biometrics - 06/04/23 1006       Pre Biometrics   Height 5' 1.5" (1.562 m)    Weight 64.7 kg    Waist Circumference 33.5 inches    Hip Circumference 38 inches    Waist to Hip Ratio 0.88 %    BMI (Calculated) 26.52    Grip Strength 25.3 kg             Post Biometrics - 10/26/23 0815        Post  Biometrics   Height 5' 1.5" (1.562 m)    Weight 62.6 kg    Waist Circumference 32 inches    Hip Circumference 37 inches    Waist to Hip Ratio 0.86 %    BMI (Calculated) 25.66    Grip Strength 25.2 kg    Single Leg Stand 30 seconds

## 2023-10-27 DIAGNOSIS — Z1231 Encounter for screening mammogram for malignant neoplasm of breast: Secondary | ICD-10-CM | POA: Diagnosis not present

## 2023-10-27 LAB — HM MAMMOGRAPHY

## 2023-10-28 ENCOUNTER — Encounter (HOSPITAL_COMMUNITY)
Admission: RE | Admit: 2023-10-28 | Discharge: 2023-10-28 | Disposition: A | Discharge status: Home / Self Care | Payer: Medicare Other | Source: Ambulatory Visit | Attending: Pulmonary Disease

## 2023-10-28 DIAGNOSIS — J4489 Other specified chronic obstructive pulmonary disease: Secondary | ICD-10-CM

## 2023-10-28 NOTE — Progress Notes (Signed)
 Daily Session Note  Patient Details  Name: KAMEREN BAADE MRN: [id number] Date of Birth: [birth year] Referring Provider:   Flowsheet Row PULMONARY REHAB COPD ORIENTATION from 06/04/2023 in [facility] CARDIAC REHABILITATION  Referring Provider Dr. [name]       Encounter Date: 10/28/2023  Check In:  Session Check In - 10/28/23 0745       Check-In   Supervising physician immediately available to respond to emergencies See telemetry face sheet for immediately available MD    Location [facility]    Staff Present Powell Benders, BS, Exercise Physiologist;Jessica Vonzell, MA, RCEP, CCRP, CCET    Virtual Visit No    Medication changes reported     No    Fall or balance concerns reported    No    Tobacco Cessation No Change    Warm-up and Cool-down Performed on first and last piece of equipment    Resistance Training Performed Yes    VAD Patient? No    PAD/SET Patient? No      Pain Assessment   Currently in Pain? No/denies    Multiple Pain Sites No             Capillary Blood Glucose: No results found for this or any previous visit (from the past 24 hours).    Social History   Tobacco Use  Smoking Status Former   Current packs/day: 0.00   Average packs/day: 3.0 packs/day for 33.0 years (99.0 ttl pk-yrs)   Types: Cigarettes   Start date: 10/27/1972   Quit date: 2007   Years since quitting: 18.1  Smokeless Tobacco Never    Goals Met:  Independence with exercise equipment Exercise tolerated well No report of concerns or symptoms today Strength training completed today  Goals Unmet:  Not Applicable  Comments: Pt able to follow exercise prescription today without complaint.  Will continue to monitor for progression.

## 2023-10-30 ENCOUNTER — Encounter (HOSPITAL_COMMUNITY)
Admission: RE | Admit: 2023-10-30 | Discharge: 2023-10-30 | Disposition: A | Discharge status: Home / Self Care | Payer: Medicare Other | Source: Ambulatory Visit | Attending: Pulmonary Disease | Admitting: Pulmonary Disease

## 2023-10-30 DIAGNOSIS — J4489 Other specified chronic obstructive pulmonary disease: Secondary | ICD-10-CM | POA: Diagnosis not present

## 2023-10-30 NOTE — Progress Notes (Signed)
 Daily Session Note  Patient Details  Name: [name] MRN: [id number] Date of Birth: 1961-02-23 Referring Provider:   Flowsheet Row PULMONARY REHAB COPD ORIENTATION from 06/04/2023 in [facility] CARDIAC REHABILITATION  Referring Provider Dr. [name]       Encounter Date: 10/30/2023  Check In:  Session Check In - 10/30/23 9177       Check-In   Supervising physician immediately available to respond to emergencies See telemetry face sheet for immediately available MD    Staff Present Ellouise Gear, RN;Chari Parmenter Vonzell, MA, RCEP, CCRP, CCET    Virtual Visit No    Medication changes reported     No    Fall or balance concerns reported    No    Tobacco Cessation No Change    Resistance Training Performed Yes    VAD Patient? No    PAD/SET Patient? No      Pain Assessment   Currently in Pain? No/denies    Multiple Pain Sites No             Capillary Blood Glucose: No results found for this or any previous visit (from the past 24 hours).    Social History   Tobacco Use  Smoking Status Former   Current packs/day: 0.00   Average packs/day: 3.0 packs/day for 33.0 years (99.0 ttl pk-yrs)   Types: Cigarettes   Start date: 10/27/1972   Quit date: 2007   Years since quitting: 18.1  Smokeless Tobacco Never    Goals Met:  Independence with exercise equipment Exercise tolerated well No report of concerns or symptoms today Strength training completed today  Goals Unmet:  Not Applicable  Comments: Pt able to follow exercise prescription today without complaint.  Will continue to monitor for progression.

## 2023-10-30 NOTE — Progress Notes (Signed)
 Daily Session Note  Patient Details  Name: [name] MRN: [id number] Date of Birth: 1961-08-07 Referring Provider:   Flowsheet Row PULMONARY REHAB COPD ORIENTATION from 06/04/2023 in [facility] CARDIAC REHABILITATION  Referring Provider Dr. [name]       Encounter Date: 10/30/2023  Check In:  Session Check In - 10/30/23 0817       Check-In   Supervising physician immediately available to respond to emergencies See telemetry face sheet for immediately available MD    Location [facility]    Staff Present Ellouise Gear, RN;Danyiel Crespin Vonzell, MA, RCEP, CCRP, CCET    Virtual Visit No    Medication changes reported     No    Fall or balance concerns reported    No    Tobacco Cessation No Change    Warm-up and Cool-down Performed on first and last piece of equipment    Resistance Training Performed Yes    VAD Patient? No    PAD/SET Patient? No      Pain Assessment   Currently in Pain? No/denies    Multiple Pain Sites No             Capillary Blood Glucose: No results found for this or any previous visit (from the past 24 hours).    Social History   Tobacco Use  Smoking Status Former   Current packs/day: 0.00   Average packs/day: 3.0 packs/day for 33.0 years (99.0 ttl pk-yrs)   Types: Cigarettes   Start date: 10/27/1972   Quit date: 2007   Years since quitting: 18.1  Smokeless Tobacco Never    Goals Met:  Proper associated with RPD/PD & O2 Sat Independence with exercise equipment Using PLB without cueing & demonstrates good technique Exercise tolerated well No report of concerns or symptoms today Strength training completed today  Goals Unmet:  Not Applicable  Comments: Pt able to follow exercise prescription today without complaint.  Will continue to monitor for progression.

## 2023-11-02 ENCOUNTER — Encounter (HOSPITAL_COMMUNITY)
Admission: RE | Admit: 2023-11-02 | Discharge: 2023-11-02 | Disposition: A | Discharge status: Home / Self Care | Payer: Medicare Other | Source: Ambulatory Visit | Attending: Pulmonary Disease | Admitting: Pulmonary Disease

## 2023-11-02 DIAGNOSIS — J4489 Other specified chronic obstructive pulmonary disease: Secondary | ICD-10-CM | POA: Diagnosis not present

## 2023-11-02 NOTE — Progress Notes (Signed)
 Daily Session Note  Patient Details  Name: [name] MRN: [id number] Date of Birth: January 13, 1961 Referring Provider:   Flowsheet Row PULMONARY REHAB COPD ORIENTATION from 06/04/2023 in [facility] CARDIAC REHABILITATION  Referring Provider Dr. Villa Greaser       Encounter Date: 11/02/2023  Check In:  Session Check In - 11/02/23 0802       Check-In   Supervising physician immediately available to respond to emergencies See telemetry face sheet for immediately available MD    Location [facility]    Staff Present Clotilda Danish, BS, Exercise Physiologist;Phyllis Billingsley, RN    Virtual Visit No    Medication changes reported     No    Fall or balance concerns reported    No    Warm-up and Cool-down Performed on first and last piece of equipment    Resistance Training Performed Yes    VAD Patient? No    PAD/SET Patient? No      Pain Assessment   Currently in Pain? No/denies             Capillary Blood Glucose: No results found for this or any previous visit (from the past 24 hours).    Social History   Tobacco Use  Smoking Status Former   Current packs/day: 0.00   Average packs/day: 3.0 packs/day for 33.0 years (99.0 ttl pk-yrs)   Types: Cigarettes   Start date: 10/27/1972   Quit date: 2007   Years since quitting: 18.1  Smokeless Tobacco Never    Goals Met:  Independence with exercise equipment Exercise tolerated well No report of concerns or symptoms today Strength training completed today  Goals Unmet:  Not Applicable  Comments: Pt able to follow exercise prescription today without complaint.  Will continue to monitor for progression.

## 2023-11-04 ENCOUNTER — Encounter (HOSPITAL_COMMUNITY): Payer: Medicare Other

## 2023-11-06 ENCOUNTER — Encounter (HOSPITAL_COMMUNITY)
Admission: RE | Admit: 2023-11-06 | Discharge: 2023-11-06 | Disposition: A | Discharge status: Home / Self Care | Payer: Medicare Other | Source: Ambulatory Visit | Attending: Pulmonary Disease | Admitting: Pulmonary Disease

## 2023-11-06 DIAGNOSIS — J4489 Other specified chronic obstructive pulmonary disease: Secondary | ICD-10-CM

## 2023-11-06 NOTE — Progress Notes (Signed)
Daily Session Note  Patient Details  Name: [name] MRN: [id number] Date of Birth: 1961-07-22 Referring Provider:   Flowsheet Row PULMONARY REHAB COPD ORIENTATION from 06/04/2023 in [facility] CARDIAC REHABILITATION  Referring Provider Dr. Vassie Loll       Encounter Date: 11/06/2023  Check In:  Session Check In - 11/06/23 0837       Check-In   Staff Present Terrance Mass, RN;Jessica Juanetta Gosling, MA, RCEP, CCRP, CCET    Virtual Visit No    Medication changes reported     No    Fall or balance concerns reported    No    Resistance Training Performed Yes    VAD Patient? No    PAD/SET Patient? No      Pain Assessment   Currently in Pain? No/denies    Multiple Pain Sites No             Capillary Blood Glucose: No results found for this or any previous visit (from the past 24 hours).    Social History   Tobacco Use  Smoking Status Former   Current packs/day: 0.00   Average packs/day: 3.0 packs/day for 33.0 years (99.0 ttl pk-yrs)   Types: Cigarettes   Start date: 10/27/1972   Quit date: 2007   Years since quitting: 18.1  Smokeless Tobacco Never    Goals Met:  Independence with exercise equipment Exercise tolerated well Personal goals reviewed No report of concerns or symptoms today Strength training completed today  Goals Unmet:  Not Applicable  Comments:  Pt able to follow exercise prescription today without complaint.  Will continue to monitor for progression.

## 2023-11-09 ENCOUNTER — Encounter (HOSPITAL_COMMUNITY)
Admission: RE | Admit: 2023-11-09 | Discharge: 2023-11-09 | Disposition: A | Discharge status: Home / Self Care | Payer: Medicare Other | Source: Ambulatory Visit | Attending: Pulmonary Disease

## 2023-11-09 DIAGNOSIS — J4489 Other specified chronic obstructive pulmonary disease: Secondary | ICD-10-CM

## 2023-11-09 NOTE — Progress Notes (Signed)
Discharge Progress Report  Patient Details  Name: [name] MRN: [id number] Date of Birth: 05-Apr-1961 Referring Provider:   Flowsheet Row PULMONARY REHAB COPD ORIENTATION from 06/04/2023 in [facility] CARDIAC REHABILITATION  Referring Provider Dr. Vassie Loll        Number of Visits: 36  Reason for Discharge:  Patient reached a stable level of exercise. Patient independent in their exercise. Patient has met program and personal goals.  Smoking History:  Social History   Tobacco Use  Smoking Status Former   Current packs/day: 0.00   Average packs/day: 3.0 packs/day for 33.0 years (99.0 ttl pk-yrs)   Types: Cigarettes   Start date: 10/27/1972   Quit date: 2007   Years since quitting: 18.1  Smokeless Tobacco Never    Diagnosis:  COPD with chronic bronchitis (HCC)  ADL [facility]:  Pulmonary Assessment Scores     Row Name 06/08/23 [phone] 11/03/23 1009       ADL [facility]   ADL Phase Entry Exit    SOB Score total 54 21    Rest 0 0    Walk 3 1    Stairs 3 2    Bath 1 1    Dress 1 1    Shop 3 1      CAT Score   CAT Score 18 8      mMRC Score   mMRC Score 3 1             Initial Exercise Prescription:  Initial Exercise Prescription - 06/04/23 1000       Date of Initial Exercise RX and Referring Provider   Date 06/04/23    Referring Provider Dr. Vassie Loll      Oxygen   Maintain Oxygen Saturation 88% or higher      Treadmill   MPH 2    Grade 0    Minutes 15      Recumbant Elliptical   Level 1    RPM 60    Minutes 15      Prescription Details   Frequency (times per week) 2    Duration Progress to 30 minutes of continuous aerobic without signs/symptoms of physical distress      Intensity   THRR 40-80% of Max Heartrate 103-138    Ratings of Perceived Exertion 11-13    Perceived Dyspnea 0-4      Resistance Training   Training Prescription Yes    Weight 3    Reps 10-15             Discharge Exercise Prescription (Final Exercise Prescription  Changes):  Exercise Prescription Changes - 10/28/23 1500       Response to Exercise   Blood Pressure (Admit) 126/64    Blood Pressure (Exit) 122/64    Heart Rate (Admit) 68 bpm    Heart Rate (Exercise) 90 bpm    Heart Rate (Exit) 68 bpm    Oxygen Saturation (Admit) 91 %    Oxygen Saturation (Exercise) 90 %    Oxygen Saturation (Exit) 93 %    Rating of Perceived Exertion (Exercise) 12    Perceived Dyspnea (Exercise) 2    Duration Continue with 30 min of aerobic exercise without signs/symptoms of physical distress.    Intensity THRR unchanged      Progression   Progression Continue to progress workloads to maintain intensity without signs/symptoms of physical distress.      Resistance Training   Training Prescription Yes    Weight 3    Reps 10-15  Treadmill   MPH 2.1    Grade 0    Minutes 15    METs 2.61      Recumbant Elliptical   Level 3    RPM 50    Minutes 15    METs 3.7      Home Exercise Plan   Plans to continue exercise at Home (comment)    Frequency Add 2 additional days to program exercise sessions.      Oxygen   Maintain Oxygen Saturation 88% or higher             Functional Capacity:  6 Minute Walk     Row Name 06/04/23 1003 10/26/23 0812       6 Minute Walk   Phase Initial Discharge    Distance 1060 feet 1615 feet    Distance % Change -- 52.4 %    Distance Feet Change -- 555 ft    Walk Time 6 minutes 6 minutes    # of Rest Breaks 0 0    MPH 2.01 3.06    METS 2.66 3.69    RPE 11 11    Perceived Dyspnea  1 1    VO2 Peak 9.31 12.93    Symptoms No Yes (comment)    Comments -- SOB    Resting HR 67 bpm 74 bpm    Resting BP 110/60 100/60    Resting Oxygen Saturation  94 % 94 %    Exercise Oxygen Saturation  during 6 min walk 85 % 79 %    Max Ex. HR 90 bpm 84 bpm    Max Ex. BP 132/60 146/74    2 Minute Post BP 120/60 134/72      Interval HR   1 Minute HR 106 --    2 Minute HR 93 --    3 Minute HR 88 --    4 Minute HR 90 --    5  Minute HR 88 --    6 Minute HR 89 84    2 Minute Post HR 75 72    Interval Heart Rate? Yes Yes      Interval Oxygen   Interval Oxygen? Yes Yes    Baseline Oxygen Saturation % 94 % --    1 Minute Oxygen Saturation % 88 % --    1 Minute Liters of Oxygen 0 L --    2 Minute Oxygen Saturation % 86 % --    2 Minute Liters of Oxygen 0 L --    3 Minute Oxygen Saturation % 86 % --    3 Minute Liters of Oxygen 0 L --    4 Minute Oxygen Saturation % 85 % --    4 Minute Liters of Oxygen 0 L --    5 Minute Oxygen Saturation % 86 % --    5 Minute Liters of Oxygen 0 L --    6 Minute Oxygen Saturation % 87 % 79 %    6 Minute Liters of Oxygen 0 L 0 L    2 Minute Post Oxygen Saturation % 94 % 90 %    2 Minute Post Liters of Oxygen 0 L 0 L             Psychological, QOL, Others - Outcomes: PHQ 2/9:    11/03/2023   10:08 AM 09/03/2023    4:03 PM 08/05/2023   10:33 AM 06/04/2023    9:34 AM 03/05/2023    9:54 AM  Depression screen PHQ 2/9  Decreased Interest 0 0 0 0 0  Down, Depressed, Hopeless 0 0 0 0 0  PHQ - 2 Score 0 0 0 0 0  Altered sleeping 1 0 1 1 0  Tired, decreased energy 0 0 0 0 1  Change in appetite 0 0 0 0 0  Feeling bad or failure about yourself  0 0 0 0 0  Trouble concentrating 0 0 0 0 0  Moving slowly or fidgety/restless 0 0 0 0 0  Suicidal thoughts 0 0 0 0 0  PHQ-9 Score 1 0 1 1 1   Difficult doing work/chores Not difficult at all Not difficult at all  Not difficult at all Somewhat difficult      Personal Goals Discharge:  Nutrition & Weight - Outcomes:  Pre Biometrics - 06/04/23 1006       Pre Biometrics   Height 5' 1.5" (1.562 m)    Weight 64.7 kg    Waist Circumference 33.5 inches    Hip Circumference 38 inches    Waist to Hip Ratio 0.88 %    BMI (Calculated) 26.52    Grip Strength 25.3 kg             Post Biometrics - 10/26/23 0815        Post  Biometrics   Height 5' 1.5" (1.562 m)    Weight 62.6 kg    Waist Circumference 32 inches    Hip  Circumference 37 inches    Waist to Hip Ratio 0.86 %    BMI (Calculated) 25.66    Grip Strength 25.2 kg    Single Leg Stand 30 seconds            Goals reviewed with patient; copy given to patient.

## 2023-11-09 NOTE — Progress Notes (Signed)
Daily Session Note  Patient Details  Name: [name] MRN: [id number] Date of Birth: [birth year] Referring Provider:   Flowsheet Row PULMONARY REHAB COPD ORIENTATION from 06/04/2023 in [facility] CARDIAC REHABILITATION  Referring Provider Dr. Vassie Loll       Encounter Date: 11/09/2023  Check In:  Session Check In - 11/09/23 0804       Check-In   Supervising physician immediately available to respond to emergencies See telemetry face sheet for immediately available MD    Location [facility]    Staff Present Hulen Luster, BS, RRT, CPFT;Debra Laural Benes, RN, BSN;Ramy Greth, MA, RCEP, CCRP, CCET    Virtual Visit No    Medication changes reported     No    Fall or balance concerns reported    No    Warm-up and Cool-down Performed on first and last piece of equipment    Resistance Training Performed Yes    VAD Patient? No    PAD/SET Patient? No      Pain Assessment   Currently in Pain? No/denies             Capillary Blood Glucose: No results found for this or any previous visit (from the past 24 hours).    Social History   Tobacco Use  Smoking Status Former   Current packs/day: 0.00   Average packs/day: 3.0 packs/day for 33.0 years (99.0 ttl pk-yrs)   Types: Cigarettes   Start date: 10/27/1972   Quit date: 2007   Years since quitting: 18.1  Smokeless Tobacco Never    Goals Met:  Proper associated with RPD/PD & O2 Sat Independence with exercise equipment Using PLB without cueing & demonstrates good technique Exercise tolerated well Personal goals reviewed No report of concerns or symptoms today Strength training completed today  Goals Unmet:  Not Applicable  Comments:  Cotina graduated today from  rehab with 36 sessions completed.  Details of the patient's exercise prescription and what She needs to do in order to continue the prescription and progress were discussed with patient.  Patient was given a copy of prescription and goals.   Patient verbalized understanding. Christean plans to continue to exercise by walking at home.

## 2023-11-09 NOTE — Progress Notes (Signed)
Pulmonary Individual Treatment Plan  Patient Details  Name: [name] MRN: [id number] Date of Birth: 01-03-1961 Referring Provider:   Flowsheet Row PULMONARY REHAB COPD ORIENTATION from 06/04/2023 in [facility] CARDIAC REHABILITATION  Referring Provider Dr. Vassie Loll       Initial Encounter Date:  Flowsheet Row PULMONARY REHAB COPD ORIENTATION from 06/04/2023 in [facility] CARDIAC REHABILITATION  Date 06/04/23       Visit Diagnosis: COPD with chronic bronchitis (HCC)  Patient's Home Medications on Admission:   Current Outpatient Medications:    albuterol (PROVENTIL) (2.5 MG/3ML) 0.083% nebulizer solution, USE 1 VIAL IN NEBULIZER EVERY 4 HOURS AS NEEDED FOR SHORTNESS OF BREATH, Disp: 360 mL, Rfl: 0   albuterol (VENTOLIN HFA) 108 (90 Base) MCG/ACT inhaler, Inhale 2 puffs into the lungs every 4 (four) hours as needed for wheezing or shortness of breath., Disp: 8 g, Rfl: 5   amoxicillin-clavulanate (AUGMENTIN) 875-125 MG tablet, Take 1 tablet by mouth 2 (two) times daily., Disp: 14 tablet, Rfl: 0   aspirin 81 MG EC tablet, Take 81 mg by mouth at bedtime., Disp: , Rfl:    Cholecalciferol (VITAMIN D3) 400 units CAPS, Take 400 Units by mouth at bedtime. , Disp: , Rfl:    ezetimibe (ZETIA) 10 MG tablet, Take 1 tablet (10 mg total) by mouth daily., Disp: 90 tablet, Rfl: 3   Fluticasone-Umeclidin-Vilant (TRELEGY ELLIPTA) 100-62.5-25 MCG/ACT AEPB, Inhale 1 puff into the lungs daily., Disp: 1 each, Rfl: 11   levothyroxine (SYNTHROID) 125 MCG tablet, 1 qam all days except Mondays  use 1/2 in the am., Disp: 90 tablet, Rfl: 1   metoprolol succinate (TOPROL-XL) 25 MG 24 hr tablet, 1/2 qd, Disp: 90 tablet, Rfl: 3   Multiple Vitamin (MULTIVITAMIN) tablet, Take 1 tablet by mouth daily., Disp: , Rfl:    nitroGLYCERIN (NITROSTAT) 0.4 MG SL tablet, Place 1 tablet (0.4 mg total) under the tongue every 5 (five) minutes x 3 doses as needed for chest pain (if no relief after 3rd dose, proceed to ED or call  [phone])., Disp: 25 tablet, Rfl: 3   pantoprazole (PROTONIX) 40 MG tablet, Take 1 tablet (40 mg total) by mouth daily., Disp: 90 tablet, Rfl: 1   predniSONE (DELTASONE) 20 MG tablet, 2 every day for 5 days, Disp: 10 tablet, Rfl: 0   rosuvastatin (CRESTOR) 40 MG tablet, Take 1 tablet (40 mg total) by mouth daily., Disp: 90 tablet, Rfl: 1   vitamin C (ASCORBIC ACID) 500 MG tablet, Take 500 mg by mouth daily., Disp: , Rfl:  No current facility-administered medications for this encounter.  Facility-Administered Medications Ordered in Other Encounters:    [facility] dose#1 phenylephrine 1%/ketorolac 0.3% (OMIDRIA) in BSS 500 ML (MUST DILUTE PRIOR TO USE) [employer], , , PRN, Pecolia Ades, MD, 500 mL at 11/14/22 1054  Past Medical History: Past Medical History:  Diagnosis Date   COPD (chronic obstructive pulmonary disease) (HCC)    Coronary atherosclerosis of native coronary artery    DES RCA 5/09, nonobstructive left system   Essential hypertension    Hypothyroidism    Mixed hyperlipidemia    NSTEMI (non-ST elevated myocardial infarction) (HCC)    2009    Tobacco Use: Social History   Tobacco Use  Smoking Status Former   Current packs/day: 0.00   Average packs/day: 3.0 packs/day for 33.0 years (99.0 ttl pk-yrs)   Types: Cigarettes   Start date: 10/27/1972   Quit date: 2007   Years since quitting: 18.1  Smokeless Tobacco Never  Labs: Review Flowsheet  More data exists      Latest Ref Rng & Units 07/29/2021 01/01/2022 07/03/2022 01/19/2023 07/27/2023  Labs for ITP Cardiac and Pulmonary Rehab  Cholestrol 100 - 199 mg/dL 638  756  433  295  188   LDL (calc) 0 - 99 mg/dL 94  70  90  83  71   HDL-C >39 mg/dL 49  57  52  53  53   Trlycerides 0 - 149 mg/dL 416  606  301  601  093     Capillary Blood Glucose: Lab Results  Component Value Date   GLUCAP 97 06/21/2018     Pulmonary Assessment Scores:  Pulmonary Assessment Scores     Row Name 06/08/23 0811 11/03/23 1009       ADL  UCSD   ADL Phase Entry Exit    SOB Score total 54 21    Rest 0 0    Walk 3 1    Stairs 3 2    Bath 1 1    Dress 1 1    Shop 3 1      CAT Score   CAT Score 18 8      mMRC Score   mMRC Score 3 1            UCSD: Self-administered rating of dyspnea associated with activities of daily living (ADLs) 6-point scale (0 = "not at all" to 5 = "maximal or unable to do because of breathlessness")  Scoring Scores range from 0 to 120.  Minimally important difference is 5 units  CAT: CAT can identify the health impairment of COPD patients and is better correlated with disease progression.  CAT has a scoring range of zero to 40. The CAT score is classified into four groups of low (less than 10), medium (10 - 20), high (21-30) and very high (31-40) based on the impact level of disease on health status. A CAT score over 10 suggests significant symptoms.  A worsening CAT score could be explained by an exacerbation, poor medication adherence, poor inhaler technique, or progression of COPD or comorbid conditions.  CAT MCID is 2 points  mMRC: mMRC (Modified Medical Research Council) Dyspnea Scale is used to assess the degree of baseline functional disability in patients of respiratory disease due to dyspnea. No minimal important difference is established. A decrease in score of 1 point or greater is considered a positive change.   Pulmonary Function Assessment:  Pulmonary Function Assessment - 11/03/23 1009       Breath   Shortness of Breath Yes             Exercise Target Goals: Exercise Program Goal: Individual exercise prescription set using results from initial 6 min walk test and THRR while considering  patient's activity barriers and safety.   Exercise Prescription Goal: Initial exercise prescription builds to 30-45 minutes a day of aerobic activity, 2-3 days per week.  Home exercise guidelines will be given to patient during program as part of exercise prescription that the  participant will acknowledge.  Activity Barriers & Risk Stratification:  Activity Barriers & Cardiac Risk Stratification - 05/29/23 0907       Activity Barriers & Cardiac Risk Stratification   Activity Barriers Shortness of Breath;Deconditioning    Cardiac Risk Stratification Moderate             6 Minute Walk:  6 Minute Walk     Row Name 06/04/23 1003 10/26/23 2355  6 Minute Walk   Phase Initial Discharge    Distance 1060 feet 1615 feet    Distance % Change -- 52.4 %    Distance Feet Change -- 555 ft    Walk Time 6 minutes 6 minutes    # of Rest Breaks 0 0    MPH 2.01 3.06    METS 2.66 3.69    RPE 11 11    Perceived Dyspnea  1 1    VO2 Peak 9.31 12.93    Symptoms No Yes (comment)    Comments -- SOB    Resting HR 67 bpm 74 bpm    Resting BP 110/60 100/60    Resting Oxygen Saturation  94 % 94 %    Exercise Oxygen Saturation  during 6 min walk 85 % 79 %    Max Ex. HR 90 bpm 84 bpm    Max Ex. BP 132/60 146/74    2 Minute Post BP 120/60 134/72      Interval HR   1 Minute HR 106 --    2 Minute HR 93 --    3 Minute HR 88 --    4 Minute HR 90 --    5 Minute HR 88 --    6 Minute HR 89 84    2 Minute Post HR 75 72    Interval Heart Rate? Yes Yes      Interval Oxygen   Interval Oxygen? Yes Yes    Baseline Oxygen Saturation % 94 % --    1 Minute Oxygen Saturation % 88 % --    1 Minute Liters of Oxygen 0 L --    2 Minute Oxygen Saturation % 86 % --    2 Minute Liters of Oxygen 0 L --    3 Minute Oxygen Saturation % 86 % --    3 Minute Liters of Oxygen 0 L --    4 Minute Oxygen Saturation % 85 % --    4 Minute Liters of Oxygen 0 L --    5 Minute Oxygen Saturation % 86 % --    5 Minute Liters of Oxygen 0 L --    6 Minute Oxygen Saturation % 87 % 79 %    6 Minute Liters of Oxygen 0 L 0 L    2 Minute Post Oxygen Saturation % 94 % 90 %    2 Minute Post Liters of Oxygen 0 L 0 L             Oxygen Initial Assessment:   Oxygen Re-Evaluation:  Oxygen  Re-Evaluation     Row Name 06/08/23 0802 06/15/23 0819 07/06/23 0813 08/03/23 0807 09/02/23 0834     Program Oxygen Prescription   Program Oxygen Prescription -- None None None None     Home Oxygen   Home Oxygen Device -- None None None None   Sleep Oxygen Prescription -- None None None None   Home Exercise Oxygen Prescription -- None None None None   Home Resting Oxygen Prescription -- None None None None     Goals/Expected Outcomes   Short Term Goals -- -- To learn and understand importance of monitoring SPO2 with pulse oximeter and demonstrate accurate use of the pulse oximeter.;To learn and understand importance of maintaining oxygen saturations>88%;To learn and demonstrate proper pursed lip breathing techniques or other breathing techniques. ;To learn and demonstrate proper use of respiratory medications To learn and understand importance of monitoring SPO2 with pulse oximeter and demonstrate accurate  use of the pulse oximeter.;To learn and understand importance of maintaining oxygen saturations>88%;To learn and demonstrate proper pursed lip breathing techniques or other breathing techniques. ;To learn and demonstrate proper use of respiratory medications To learn and understand importance of monitoring SPO2 with pulse oximeter and demonstrate accurate use of the pulse oximeter.;To learn and understand importance of maintaining oxygen saturations>88%;To learn and demonstrate proper pursed lip breathing techniques or other breathing techniques. ;To learn and demonstrate proper use of respiratory medications   Long  Term Goals -- -- Verbalizes importance of monitoring SPO2 with pulse oximeter and return demonstration;Maintenance of O2 saturations>88%;Compliance with respiratory medication;Exhibits proper breathing techniques, such as pursed lip breathing or other method taught during program session Verbalizes importance of monitoring SPO2 with pulse oximeter and return demonstration;Maintenance of  O2 saturations>88%;Compliance with respiratory medication;Exhibits proper breathing techniques, such as pursed lip breathing or other method taught during program session Verbalizes importance of monitoring SPO2 with pulse oximeter and return demonstration;Maintenance of O2 saturations>88%;Compliance with respiratory medication;Exhibits proper breathing techniques, such as pursed lip breathing or other method taught during program session   Comments Reviewed PLB technique with pt.  Talked about how it works and it's importance in maintaining their exercise saturations. Lakishia is doing well in the program so far. Her breathing is staying WNL when exrcising 88-90s. She feels her breathing improving each week and her energy increaing as well. She does breath through her mouth and is trying PLB. Daejah is doing well in rehab.  Her breathing is doing better and she is feelng better overall.  Her sats conitnue to drop some with exercise but she does not feel too winded. She is good about using her PLB to get it to recover.  Her family has also noticed a difference in her breathing too. Chyna is doing well in rehab. Her breathing is getting better, but her saturations continue to be on the lower side.  She is going to doctor on Wednesday and willl take her report with her that day.  She will talk to them about her numbers.  She has been using her PLB to help with breathing.  She is pleased with her improvement and progress. Justines oxygen does continue to drop during exreicse and talking to the 85-86% It does increase when she does PLB and takes deep breaths. She stated that she feels like her breathing has improved.   Goals/Expected Outcomes Short: Become more profiecient at using PLB. Long: Become independent at using PLB. Short : continue to foucse on PLB when exercign    long: continue to monitor breathing and oxygen stats when exercising Short: Conitnue to use to help with PLB.  Long: Continue to monitor  saturations closely. Short: Take saturations to doctor on Wednesday Long; Continue to improve breathing. Short: monitor oxygen stats   long term: contiue to work on PLB when exercising    Row Name 10/28/23 0806             Program Oxygen Prescription   Program Oxygen Prescription None         Home Oxygen   Home Oxygen Device None       Sleep Oxygen Prescription None       Home Exercise Oxygen Prescription None       Home Resting Oxygen Prescription None         Goals/Expected Outcomes   Short Term Goals To learn and understand importance of monitoring SPO2 with pulse oximeter and demonstrate accurate use of the pulse oximeter.;To  learn and understand importance of maintaining oxygen saturations>88%;To learn and demonstrate proper pursed lip breathing techniques or other breathing techniques. ;To learn and demonstrate proper use of respiratory medications       Long  Term Goals Verbalizes importance of monitoring SPO2 with pulse oximeter and return demonstration;Maintenance of O2 saturations>88%;Compliance with respiratory medication;Exhibits proper breathing techniques, such as pursed lip breathing or other method taught during program session       Comments Emalee is doing well with her breathing.  Her sats do drop with walking, but she uses her PLB to keep them up.  She is good about using her PLB and monitoring her saturations.  She feels her breathing has gotten better and more efficient.  She is doing well with her inhlaers too.       Goals/Expected Outcomes Continue to manage pulmonary disease                Oxygen Discharge (Final Oxygen Re-Evaluation):  Oxygen Re-Evaluation - 10/28/23 0806       Program Oxygen Prescription   Program Oxygen Prescription None      Home Oxygen   Home Oxygen Device None    Sleep Oxygen Prescription None    Home Exercise Oxygen Prescription None    Home Resting Oxygen Prescription None      Goals/Expected Outcomes   Short Term Goals To  learn and understand importance of monitoring SPO2 with pulse oximeter and demonstrate accurate use of the pulse oximeter.;To learn and understand importance of maintaining oxygen saturations>88%;To learn and demonstrate proper pursed lip breathing techniques or other breathing techniques. ;To learn and demonstrate proper use of respiratory medications    Long  Term Goals Verbalizes importance of monitoring SPO2 with pulse oximeter and return demonstration;Maintenance of O2 saturations>88%;Compliance with respiratory medication;Exhibits proper breathing techniques, such as pursed lip breathing or other method taught during program session    Comments Neve is doing well with her breathing.  Her sats do drop with walking, but she uses her PLB to keep them up.  She is good about using her PLB and monitoring her saturations.  She feels her breathing has gotten better and more efficient.  She is doing well with her inhlaers too.    Goals/Expected Outcomes Continue to manage pulmonary disease             Initial Exercise Prescription:  Initial Exercise Prescription - 06/04/23 1000       Date of Initial Exercise RX and Referring Provider   Date 06/04/23    Referring Provider Dr. Vassie Loll      Oxygen   Maintain Oxygen Saturation 88% or higher      Treadmill   MPH 2    Grade 0    Minutes 15      Recumbant Elliptical   Level 1    RPM 60    Minutes 15      Prescription Details   Frequency (times per week) 2    Duration Progress to 30 minutes of continuous aerobic without signs/symptoms of physical distress      Intensity   THRR 40-80% of Max Heartrate 103-138    Ratings of Perceived Exertion 11-13    Perceived Dyspnea 0-4      Resistance Training   Training Prescription Yes    Weight 3    Reps 10-15             Perform Capillary Blood Glucose checks as needed.  Exercise Prescription Changes:   Exercise  Prescription Changes     Row Name 06/15/23 1300 07/01/23 1300  07/06/23 0800 07/13/23 1200 07/27/23 1200     Response to Exercise   Blood Pressure (Admit) 118/70 122/68 -- 124/64 118/60   Blood Pressure (Exercise) 126/70 132/70 -- -- --   Blood Pressure (Exit) 100/70 112/64 -- 110/60 110/60   Heart Rate (Admit) 73 bpm 70 bpm -- 71 bpm 67 bpm   Heart Rate (Exercise) 85 bpm 93 bpm -- 90 bpm 90 bpm   Heart Rate (Exit) 75 bpm 81 bpm -- 74 bpm 79 bpm   Oxygen Saturation (Admit) 95 % 93 % -- 91 % 92 %   Oxygen Saturation (Exercise) 88 % 90 % -- 88 % 88 %   Oxygen Saturation (Exit) 94 % 91 % -- 94 % 94 %   Rating of Perceived Exertion (Exercise) 12 12 -- 12 12   Perceived Dyspnea (Exercise) 1 1 -- 2 2   Duration Continue with 30 min of aerobic exercise without signs/symptoms of physical distress. Continue with 30 min of aerobic exercise without signs/symptoms of physical distress. -- Continue with 30 min of aerobic exercise without signs/symptoms of physical distress. Continue with 30 min of aerobic exercise without signs/symptoms of physical distress.   Intensity THRR unchanged THRR unchanged -- THRR unchanged THRR unchanged     Progression   Progression Continue to progress workloads to maintain intensity without signs/symptoms of physical distress. Continue to progress workloads to maintain intensity without signs/symptoms of physical distress. -- Continue to progress workloads to maintain intensity without signs/symptoms of physical distress. Continue to progress workloads to maintain intensity without signs/symptoms of physical distress.     Resistance Training   Training Prescription Yes Yes -- Yes Yes   Weight 3 lbs 3 lbs -- 3 lbs / blue band 3 lbs / blue band   Reps 10-15 10-15 -- 10-15 10-15   Time 1 Minutes -- -- -- --     Treadmill   MPH 2 2.2 -- 2 1.7   Grade 0 0.5 -- 0.5 0   Minutes 15 15 -- 15 15   METs 2.53 2.84 -- 2.67 2.3     Recumbant Elliptical   Level 3 3 -- 3 3   RPM 47 38 -- 45 47   Minutes 15 15 -- 15 15   METs 2.3 2.5 -- 3.1  3.1     Home Exercise Plan   Plans to continue exercise at -- -- Home (comment)  walking, [facility] Home (comment) Home (comment)   Frequency -- -- Add 2 additional days to program exercise sessions. Add 2 additional days to program exercise sessions. Add 2 additional days to program exercise sessions.   Initial Home Exercises Provided -- -- 07/06/23 -- --     Oxygen   Maintain Oxygen Saturation 88% or higher 88% or higher -- 88% or higher 88% or higher    Row Name 08/10/23 1500 09/07/23 1300 09/21/23 1200 10/28/23 1500       Response to Exercise   Blood Pressure (Admit) 104/66 112/68 120/60 126/64    Blood Pressure (Exit) 102/60 120/60 128/70 122/64    Heart Rate (Admit) 72 bpm 66 bpm 64 bpm 68 bpm    Heart Rate (Exercise) 95 bpm 88 bpm 93 bpm 90 bpm    Heart Rate (Exit) 78 bpm 73 bpm 70 bpm 68 bpm    Oxygen Saturation (Admit) 95 % 94 % 94 % 91 %    Oxygen Saturation (Exercise)  85 % 88 % 88 % 90 %    Oxygen Saturation (Exit) 94 % 95 % 93 % 93 %    Rating of Perceived Exertion (Exercise) 13 12 12 12     Perceived Dyspnea (Exercise) 2 1 2 2     Duration Continue with 30 min of aerobic exercise without signs/symptoms of physical distress. Continue with 30 min of aerobic exercise without signs/symptoms of physical distress. Continue with 30 min of aerobic exercise without signs/symptoms of physical distress. Continue with 30 min of aerobic exercise without signs/symptoms of physical distress.    Intensity THRR unchanged THRR unchanged THRR unchanged THRR unchanged      Progression   Progression Continue to progress workloads to maintain intensity without signs/symptoms of physical distress. Continue to progress workloads to maintain intensity without signs/symptoms of physical distress. Continue to progress workloads to maintain intensity without signs/symptoms of physical distress. Continue to progress workloads to maintain intensity without signs/symptoms of physical distress.       Resistance Training   Training Prescription Yes Yes Yes Yes    Weight 3 lbs 3 lbs 3 3    Reps -- 10-15 10-15 10-15      Treadmill   MPH -- 2 2.2 2.1    Grade -- 0.5 0.5 0    Minutes -- 15 15 15     METs -- 2.67 2.84 2.61      Recumbant Elliptical   Level -- 3 3 3     RPM -- 47 52 50    Minutes -- 15 15 15     METs -- 3.4 4.9 3.7      Home Exercise Plan   Plans to continue exercise at -- Home (comment) Home (comment) Home (comment)    Frequency -- Add 2 additional days to program exercise sessions. Add 2 additional days to program exercise sessions. Add 2 additional days to program exercise sessions.      Oxygen   Maintain Oxygen Saturation -- 88% or higher 88% or higher 88% or higher             Exercise Comments:   Exercise Comments     Row Name 06/17/23 0814 11/09/23 0805         Exercise Comments Reviewed home exercise Verla graduated today from  rehab with 36 sessions completed.  Details of the patient's exercise prescription and what She needs to do in order to continue the prescription and progress were discussed with patient.  Patient was given a copy of prescription and goals.  Patient verbalized understanding. Ricardo plans to continue to exercise by walking at home.               Exercise Goals and Review:   Exercise Goals     Row Name 06/04/23 1005             Exercise Goals   Increase Physical Activity Yes       Intervention Provide advice, education, support and counseling about physical activity/exercise needs.;Develop an individualized exercise prescription for aerobic and resistive training based on initial evaluation findings, risk stratification, comorbidities and participant's personal goals.       Expected Outcomes Short Term: Attend rehab on a regular basis to increase amount of physical activity.;Long Term: Add in home exercise to make exercise part of routine and to increase amount of physical activity.;Long Term: Exercising regularly  at least 3-5 days a week.       Increase Strength and Stamina Yes  Intervention Provide advice, education, support and counseling about physical activity/exercise needs.;Develop an individualized exercise prescription for aerobic and resistive training based on initial evaluation findings, risk stratification, comorbidities and participant's personal goals.       Expected Outcomes Short Term: Increase workloads from initial exercise prescription for resistance, speed, and METs.;Short Term: Perform resistance training exercises routinely during rehab and add in resistance training at home;Long Term: Improve cardiorespiratory fitness, muscular endurance and strength as measured by increased METs and functional capacity ( )       Able to understand and use rate of perceived exertion (RPE) scale Yes       Intervention Provide education and explanation on how to use RPE scale       Expected Outcomes Short Term: Able to use RPE daily in rehab to express subjective intensity level;Long Term:  Able to use RPE to guide intensity level when exercising independently       Able to understand and use Dyspnea scale Yes       Intervention Provide education and explanation on how to use Dyspnea scale       Expected Outcomes Short Term: Able to use Dyspnea scale daily in rehab to express subjective sense of shortness of breath during exertion;Long Term: Able to use Dyspnea scale to guide intensity level when exercising independently       Knowledge and understanding of Target Heart Rate Range (THRR) Yes       Intervention Provide education and explanation of THRR including how the numbers were predicted and where they are located for reference       Expected Outcomes Short Term: Able to use daily as guideline for intensity in rehab;Short Term: Able to state/look up THRR;Long Term: Able to use THRR to govern intensity when exercising independently       Able to check pulse independently Yes       Intervention  Provide education and demonstration on how to check pulse in carotid and radial arteries.;Review the importance of being able to check your own pulse for safety during independent exercise       Expected Outcomes Short Term: Able to explain why pulse checking is important during independent exercise;Long Term: Able to check pulse independently and accurately       Understanding of Exercise Prescription Yes       Intervention Provide education, explanation, and written materials on patient's individual exercise prescription       Expected Outcomes Short Term: Able to explain program exercise prescription;Long Term: Able to explain home exercise prescription to exercise independently                Exercise Goals Re-Evaluation :  Exercise Goals Re-Evaluation     Row Name 06/08/23 0800 06/15/23 0757 06/15/23 1332 07/01/23 1312 07/06/23 0759     Exercise Goal Re-Evaluation   Exercise Goals Review Increase Physical Activity;Increase Strength and Stamina;Able to understand and use Dyspnea scale;Understanding of Exercise Prescription Increase Physical Activity;Increase Strength and Stamina;Understanding of Exercise Prescription Increase Physical Activity;Increase Strength and Stamina;Understanding of Exercise Prescription Increase Physical Activity;Increase Strength and Stamina;Understanding of Exercise Prescription Increase Physical Activity;Increase Strength and Stamina;Understanding of Exercise Prescription   Comments Reviewed RPE  and dyspnea scale, THR and program prescription with pt today.  Pt voiced understanding and was given a copy of goals to take home. Almedia has just started the program in the past week. She has been doing great in the program so far. She continues to walk everyday. She stated that she  feels better and has more energy. She enjoys coming to rehab and aslo likes the social aspect. Mekia has tolerated exercise well. She is increaing her level on the XR to level 3. She is  also walking everyday. Will continue to monitor and progress as able. Jimmi has been tolerating exericse well, She has inceased her speed and incline on the treadmill in the last week to 2.2/0.5. Will continue to monitor and progress as able. Ara is doing well in rehab.  She is starting to improve her stamina.  Her breathing is starting to improve.  Reviewed home exercise with pt today.  Pt plans to walk and use Cardioglide at home for exercise.  She also has a exercise bike in her basement.  Reviewed THR, pulse, RPE, sign and symptoms, pulse oximetery and when to call 911 or MD.  Also discussed weather considerations and indoor options.  Pt voiced understanding.   Expected Outcomes Short: Use RPE daily to regulate intensity. Long: Follow program prescription in THR. Short term: go over home exercise   long term: continue to attend rehab Short: continue to exercise at RPE of 12 then increase workload    Long term: continue to attend rehab Short term: continue to exerise at workload and increase when ready   long term: continue to attend rehab Short: start to add in more exercise at home. Long: conintue to improve stamina.    Row Name 07/28/23 0737 08/03/23 0758 09/02/23 0818 09/22/23 0708 10/09/23 1347     Exercise Goal Re-Evaluation   Exercise Goals Review Increase Physical Activity;Able to understand and use rate of perceived exertion (RPE) scale;Understanding of Exercise Prescription Increase Physical Activity;Increase Strength and Stamina;Understanding of Exercise Prescription Increase Physical Activity;Increase Strength and Stamina;Understanding of Exercise Prescription Increase Physical Activity;Increase Strength and Stamina;Understanding of Exercise Prescription Increase Physical Activity;Increase Strength and Stamina;Understanding of Exercise Prescription   Comments Juleen is doing well in rehab. She has increased her level on the XR to level 3 and is pushing herself. She is focusing on her  oxygen and PLB. She is currently exercising at 3.1 METs. Will continue to monitor and progress as able. Brittnae is doing doing well in rehab. She is walking and using her cardioglide on her off days. She is also doing weights and stretches each day.  She aims for 25-30 min each day.  She is looking forward to seeing her doctor on Wednesday.  He is hoping he will be pleasantly surprised. Tonetta is doing well in rehab. She continues to exericse outside of rehab by walking, weights and stretching. She states that she has more engery to do her everyday activities. Evelene is doing well in rehab. She is tolerating exercise and is is setting goals on the XR for how many miles she rides each class. On the treadmill her oxygen does tend to drop to 86-88 but goes back up when she stops and does PLB. She is on her 30th visit so she is almost done with the program. Will continue to monitor and progress as able. Atiyana has not attend rehab since 09/21/2023, she stated that it is too cold for her in the mornings to go outside. Will update when able.   Expected Outcomes Short: increase walking speed on treadmill   long: continue to attend rehab Short: Continue to exercise on her off days Long: Conitnue to improve stamina Short: Continue to exercise on her off days Long: Conitnue to improve stamina Short: Continue to exercise on her off days Long: Conitnue  to improve stamina --    Row Name 10/28/23 0759             Exercise Goal Re-Evaluation   Exercise Goals Review Increase Physical Activity;Increase Strength and Stamina;Understanding of Exercise Prescription       Comments Joslyne is nearing graduation. She improved her post by over 574ft.  She is planning to continue to exercise by walking and using her Cardioglide at home.       Expected Outcomes Continue to exercise independently                Discharge Exercise Prescription (Final Exercise Prescription Changes):  Exercise Prescription Changes -  10/28/23 1500       Response to Exercise   Blood Pressure (Admit) 126/64    Blood Pressure (Exit) 122/64    Heart Rate (Admit) 68 bpm    Heart Rate (Exercise) 90 bpm    Heart Rate (Exit) 68 bpm    Oxygen Saturation (Admit) 91 %    Oxygen Saturation (Exercise) 90 %    Oxygen Saturation (Exit) 93 %    Rating of Perceived Exertion (Exercise) 12    Perceived Dyspnea (Exercise) 2    Duration Continue with 30 min of aerobic exercise without signs/symptoms of physical distress.    Intensity THRR unchanged      Progression   Progression Continue to progress workloads to maintain intensity without signs/symptoms of physical distress.      Resistance Training   Training Prescription Yes    Weight 3    Reps 10-15      Treadmill   MPH 2.1    Grade 0    Minutes 15    METs 2.61      Recumbant Elliptical   Level 3    RPM 50    Minutes 15    METs 3.7      Home Exercise Plan   Plans to continue exercise at Home (comment)    Frequency Add 2 additional days to program exercise sessions.      Oxygen   Maintain Oxygen Saturation 88% or higher             Nutrition:  Target Goals: Understanding of nutrition guidelines, daily intake of sodium 1500mg , cholesterol 200mg , calories 30% from fat and 7% or less from saturated fats, daily to have 5 or more servings of fruits and vegetables.  Biometrics:  Pre Biometrics - 06/04/23 1006       Pre Biometrics   Height 5' 1.5" (1.562 m)    Weight 64.7 kg    Waist Circumference 33.5 inches    Hip Circumference 38 inches    Waist to Hip Ratio 0.88 %    BMI (Calculated) 26.52    Grip Strength 25.3 kg             Post Biometrics - 10/26/23 0815        Post  Biometrics   Height 5' 1.5" (1.562 m)    Weight 62.6 kg    Waist Circumference 32 inches    Hip Circumference 37 inches    Waist to Hip Ratio 0.86 %    BMI (Calculated) 25.66    Grip Strength 25.2 kg    Single Leg Stand 30 seconds             Nutrition Therapy  Plan and Nutrition Goals:   Nutrition Assessments:  MEDIFICTS Score Key: >=70 Need to make dietary changes  40-70 Heart Healthy Diet <= 40  Therapeutic Level Cholesterol Diet  Flowsheet Row PULMONARY REHAB CHRONIC OBSTRUCTIVE PULMONARY DISEASE from 11/02/[facility]23 in [facility]  Picture Your Plate Total Score on Admission 13  Picture Your Plate Total Score on Discharge 77      Picture Your Plate Scores: <16 Unhealthy dietary pattern with much room for improvement. 41-50 Dietary pattern unlikely to meet recommendations for good health and room for improvement. 51-60 More healthful dietary pattern, with some room for improvement.  >60 Healthy dietary pattern, although there may be some specific behaviors that could be improved.    Nutrition Goals Re-Evaluation:  Nutrition Goals Re-Evaluation     Row Name 06/15/23 [id number] 07/06/23 0808 08/03/23 0803 09/02/23 0829 10/28/23 0802     Goals   Nutrition Goal Healthy eating Short term: continue to cut down on chips long term: continue to eat healthy Short: Conitnue to stay away from bad foods and splurge on occassion Long; conitnue to eat healthy Healthy eating Short: Continues to pick healthy snacks long term: foacus on healtjy eating   Comment Lodema has been working on her healthy eating. She has started to cook veggie and has learned she likes peppers. She eats more chicken within the week and it is normally baked or grilled. She stated that she does not eat a lot of sweets. She is happy with her progress in healthy eating. Basil has been working on her diet.  She has been staying away from chips and sweets.  It is starting to pay off as her weight is starting to come down.  She is getting lots of fruits and vegetables.  She is pleased with weight coming off.  She says she is feeling better.  She is staying away from soda and milk and sticking to green tea and water.  She says she feels better overall with her diet now.  Lovell continues to work on her diet.  She is staying away from the "bad foods" and sticks with her healthier snacks.  She does have a little debbie on occassion, but trying to stay away.  She has been enjoying the clemintines as a snack.  She contineus to stay away from chips too. Amarii continues to work on her diet. She has been cutting back on her sweets and picking healthier snacks. She stated that she has lost some weight and is watching the calories in what she eats. Nadiah is doing well in rehab.  She did go out to eat for her sister in law's birthday on Monday and her weight was up today.  Normally she is doing well with her diet.  She is trying to stay away from sweets and snacks.  However, she did buy chips for Super Bowl.   Expected Outcome Short term: continue to cut down on chips   long term: continue to eat healthy Short: Conitnue to stay away from bad foods and splurge on occassion Long; conitnue to eat healthy short: Continue to sway out for healthy snacks Long: Continue to focus on healthy eating habits Short: Continues to pick healthy snacks   long term: foacus on healtjy eating Get back to healthy snacks and healthy eating            Nutrition Goals Discharge (Final Nutrition Goals Re-Evaluation):  Nutrition Goals Re-Evaluation - 10/28/23 0802       Goals   Nutrition Goal Short: Continues to pick healthy snacks long term: foacus on healtjy eating    Comment Yousra is doing well in rehab.  She did go out to eat for her sister in law's birthday on Monday and her weight was up today.  Normally she is doing well with her diet.  She is trying to stay away from sweets and snacks.  However, she did buy chips for Super Bowl.    Expected Outcome Get back to healthy snacks and healthy eating             Psychosocial: Target Goals: Acknowledge presence or absence of significant depression and/or stress, maximize coping skills, provide positive support system. Participant is able  to verbalize types and ability to use techniques and skills needed for reducing stress and depression.  Initial Review & Psychosocial Screening:  Initial Psych Review & Screening - 05/29/23 0936       Initial Review   Current issues with Current Sleep Concerns      Family Dynamics   Good Support System? Yes      Barriers   Psychosocial barriers to participate in program There are no identifiable barriers or psychosocial needs.      Screening Interventions   Interventions Encouraged to exercise;To provide support and resources with identified psychosocial needs;Provide feedback about the scores to participant    Expected Outcomes Short Term goal: Utilizing psychosocial counselor, staff and physician to assist with identification of specific Stressors or current issues interfering with healing process. Setting desired goal for each stressor or current issue identified.;Long Term Goal: Stressors or current issues are controlled or eliminated.;Short Term goal: Identification and review with participant of any Quality of Life or Depression concerns found by scoring the questionnaire.;Long Term goal: The participant improves quality of Life and PHQ9 Scores as seen by post scores and/or verbalization of changes             Quality of Life Scores:  Scores of 19 and below usually indicate a poorer quality of life in these areas.  A difference of  2-3 points is a clinically meaningful difference.  A difference of 2-3 points in the total score of the Quality of Life Index has been associated with significant improvement in overall quality of life, self-image, physical symptoms, and general health in studies assessing change in quality of life.   PHQ-9: Review Flowsheet  More data exists      11/03/2023 09/03/2023 08/05/2023 06/04/2023 03/05/2023  Depression screen PHQ 2/9  Decreased Interest 0 0 0 0 0  Down, Depressed, Hopeless 0 0 0 0 0  PHQ - 2 Score 0 0 0 0 0  Altered sleeping 1 0 1 1 0   Tired, decreased energy 0 0 0 0 1  Change in appetite 0 0 0 0 0  Feeling bad or failure about yourself  0 0 0 0 0  Trouble concentrating 0 0 0 0 0  Moving slowly or fidgety/restless 0 0 0 0 0  Suicidal thoughts 0 0 0 0 0  PHQ-9 Score 1 0 1 1 1   Difficult doing work/chores Not difficult at all Not difficult at all - Not difficult at all Somewhat difficult   Interpretation of Total Score  Total Score Depression Severity:  1-4 = Minimal depression, 5-9 = Mild depression, 10-14 = Moderate depression, 15-19 = Moderately severe depression, 20-27 = Severe depression   Psychosocial Evaluation and Intervention:  Psychosocial Evaluation - 05/29/23 0936       Psychosocial Evaluation & Interventions   Interventions Stress management education;Relaxation education;Encouraged to exercise with the program and follow exercise prescription    Comments 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 She lives with her husband of 42 years and they have a son and 2 grandchildren. Her husband and grandchildren are her main support people. She tries to live a very active life but has not been able to do as much as she would like recently due to her advancing COPD. She does walk everyday 30 mins. She denies any depression, anxiety or stress. She does have trouble staying asleep some nights. She recently started taking benadryl and was taking the generic brand from [facility]. She did not realize she was taking benadryl and ask if this was safe.  I advised her to speak with her pcp about her concerns about taking benadryl. She has tried melotonin in the past and it did not help. She said she had been reluctant to participate in the program for a long time eventhough Dr. Vassie Loll had recommended it but she saw a video on you-tube about pulmonary rehabilitation and its benefits and decided to give it a try. She does have a $15 dollar copayment and she was not sure if this was going to be a barrier but said she wanted  to do as much as she could of the program. No other barriers identified. Her main goals for the program are to get her lungs stronger and be able to do all the activities she wants to do. She wants to be able to help her husband mow the yard. She said she was not able to go out onto the beach with her grandchildren this summer for the first time since they were born, 14 years, due to her SOB and this really brothered her. She would like to get her lungs strong enough to participate in these activities.    Expected Outcomes Short Term: Start and attend the program consistently. Long Term: Get her lung stronger and be able to do all the activities she wants to do.    Continue Psychosocial Services  Follow up required by staff             Psychosocial Re-Evaluation:  Psychosocial Re-Evaluation     Row Name 06/15/23 0806 07/06/23 0811 08/03/23 0801 09/02/23 0820 10/28/23 0800     Psychosocial Re-Evaluation   Current issues with Current Sleep Concerns Current Sleep Concerns Current Sleep Concerns Current Sleep Concerns None Identified   Comments Suni said that she was taking an sleep aid she got from [employer] and when talking to her PCP they noticed it was a form of benadryl. She has started to not take anything for sleep. She said she will go to sleep pretty fast but will wake up within 2 hours and her mind will start racing. SHe has no other barriors Lyberti is doing well in rehab.  She had a hard time sleeping last night. She has a hard time calming her mind and staying awake from husband keeping her awake with his snoring.  Otherwise, she is feeling good mentally and doing well.  She has gotten used to the lack of sleep and adjust each day.  She is generally a positive person and enjoys coming to exercise. Steffi is doing well in rehab.  She is feeling good mentally.  She is enjoying that she is feeling better and more active again. She does admit to a short fuse and prays about it.  She denies  any stressors.  She continues to just get her 3 hrs of sleep with naps in the afternoon. She feels like it has  gotten a little better with exercise.  She is enjoying rehab and getting out with her classmates. Shanyia is well in rehab. SHe is feeling good overall and enjoys coming to rehab. She is being more active. She denies any stressors. She is still napping during the day. She feels better overall with exericse. Ryliegh is doing well in rehab.  She is feeling good mentally and staying active.  She denies any major stressors.  She is sleeping better.  She is excited but also disappointed to be graduating.  She has enjoyed the program and the motivation to keep exercising.   Expected Outcomes Shor term: continue to work on sleep    Long term: continue to have no barriors Short: Continue to work on sleep Long: conitnue to stay positive. Short: Continue to exercise for mental boost and sleep Long: Conitnue to focus on positives. Short: Continue to exercise for mental boost and sleep Long: Conitnue to focus on positives. Short: graduate Long: Continue to stay positive and exercise for mental boost   Interventions Stress management education;Relaxation education;Encouraged to attend Pulmonary Rehabilitation for the exercise Stress management education;Relaxation education;Encouraged to attend Pulmonary Rehabilitation for the exercise Stress management education;Relaxation education;Encouraged to attend Pulmonary Rehabilitation for the exercise Stress management education;Relaxation education;Encouraged to attend Pulmonary Rehabilitation for the exercise Encouraged to attend Pulmonary Rehabilitation for the exercise   Continue Psychosocial Services  Follow up required by staff Follow up required by staff Follow up required by staff Follow up required by staff Follow up required by staff            Psychosocial Discharge (Final Psychosocial Re-Evaluation):  Psychosocial Re-Evaluation - 10/28/23 0800        Psychosocial Re-Evaluation   Current issues with None Identified    Comments Kileigh is doing well in rehab.  She is feeling good mentally and staying active.  She denies any major stressors.  She is sleeping better.  She is excited but also disappointed to be graduating.  She has enjoyed the program and the motivation to keep exercising.    Expected Outcomes Short: graduate Long: Continue to stay positive and exercise for mental boost    Interventions Encouraged to attend Pulmonary Rehabilitation for the exercise    Continue Psychosocial Services  Follow up required by staff              Education: Education Goals: Education classes will be provided on a weekly basis, covering required topics. Participant will state understanding/return demonstration of topics presented.  Learning Barriers/Preferences:  Learning Barriers/Preferences - 05/29/23 0935       Learning Barriers/Preferences   Learning Barriers None    Learning Preferences Audio;Written Material             Education Topics: How Lungs Work and Diseases: - Discuss the anatomy of the lungs and diseases that can affect the lungs, such as COPD. Flowsheet Row PULMONARY REHAB CHRONIC OBSTRUCTIVE PULMONARY DISEASE from 10/28/2023 in [facility] [facility]  Date 08/05/23  Educator [facility]  Instruction Review Code 1- Verbalizes Understanding       Exercise: -Discuss the importance of exercise, FITT principles of exercise, normal and abnormal responses to exercise, and how to exercise safely.   Environmental Irritants: -Discuss types of environmental irritants and how to limit exposure to environmental irritants.   Meds/Inhalers and oxygen: - Discuss respiratory medications, definition of an inhaler and oxygen, and the proper way to use an inhaler and oxygen.   Energy Saving Techniques: - Discuss methods to  conserve energy and decrease shortness of breath when performing activities of daily living.     Bronchial Hygiene / Breathing Techniques: - Discuss breathing mechanics, pursed-lip breathing technique,  proper posture, effective ways to clear airways, and other functional breathing techniques   Cleaning Equipment: - Provides group verbal and written instruction about the health risks of elevated stress, cause of high stress, and healthy ways to reduce stress.   Nutrition I: Fats: - Discuss the types of cholesterol, what cholesterol does to the body, and how cholesterol levels can be controlled. Flowsheet Row PULMONARY REHAB CHRONIC OBSTRUCTIVE PULMONARY DISEASE from 10/28/2023 in [place] [facility]  Date 07/08/23  Educator [facility]  Instruction Review Code 1- Verbalizes Understanding       Nutrition II: Labels: -Discuss the different components of food labels and how to read food labels. Flowsheet Row PULMONARY REHAB CHRONIC OBSTRUCTIVE PULMONARY DISEASE from 10/28/2023 in [place] [facility]  Date 07/08/23  Educator [facility]  Instruction Review Code 1- Verbalizes Understanding       Respiratory Infections: - Discuss the signs and symptoms of respiratory infections, ways to prevent respiratory infections, and the importance of seeking medical treatment when having a respiratory infection.   Stress I: Signs and Symptoms: - Discuss the causes of stress, how stress may lead to anxiety and depression, and ways to limit stress. Flowsheet Row PULMONARY REHAB CHRONIC OBSTRUCTIVE PULMONARY DISEASE from 10/28/2023 in [place] [facility]  Date 07/15/23  Educator [facility]  Instruction Review Code 1- Verbalizes Understanding       Stress II: Relaxation: -Discuss relaxation techniques to limit stress. Flowsheet Row PULMONARY REHAB CHRONIC OBSTRUCTIVE PULMONARY DISEASE from 10/28/2023 in [place] [facility]  Date 07/15/23  Educator [facility]  Instruction Review Code 1- Verbalizes Understanding       Oxygen for Home/Travel: - Discuss how to  prepare for travel when on oxygen and proper ways to transport and store oxygen to ensure safety.   Knowledge Questionnaire Score:  Knowledge Questionnaire Score - 11/03/23 1008       Knowledge Questionnaire Score   Pre Score 15/18    Post Score 18/18             Core Components/Risk Factors/Patient Goals at Admission:  Personal Goals and Risk Factors at Admission - 05/29/23 0935       Core Components/Risk Factors/Patient Goals on Admission    Weight Management Weight Maintenance    Improve shortness of breath with ADL's Yes    Intervention Provide education, individualized exercise plan and daily activity instruction to help decrease symptoms of SOB with activities of daily living.    Expected Outcomes Short Term: Improve cardiorespiratory fitness to achieve a reduction of symptoms when performing ADLs;Long Term: Be able to perform more ADLs without symptoms or delay the onset of symptoms    Increase knowledge of respiratory medications and ability to use respiratory devices properly  Yes    Intervention Provide education and demonstration as needed of appropriate use of medications, inhalers, and oxygen therapy.    Expected Outcomes Short Term: Achieves understanding of medications use. Understands that oxygen is a medication prescribed by physician. Demonstrates appropriate use of inhaler and oxygen therapy.;Long Term: Maintain appropriate use of medications, inhalers, and oxygen therapy.    Hypertension Yes    Intervention Provide education on lifestyle modifcations including regular physical activity/exercise, weight management, moderate sodium restriction and increased consumption of fresh fruit, vegetables, and low fat dairy, alcohol moderation, and smoking cessation.;Monitor prescription use compliance.  Expected Outcomes Short Term: Continued assessment and intervention until BP is < 140/76mm HG in hypertensive participants. < 130/30mm HG in hypertensive participants with  diabetes, heart failure or chronic kidney disease.;Long Term: Maintenance of blood pressure at goal levels.    Lipids Yes    Intervention Provide education and support for participant on nutrition & aerobic/resistive exercise along with prescribed medications to achieve LDL 70mg , HDL >40mg .    Expected Outcomes Short Term: Participant states understanding of desired cholesterol values and is compliant with medications prescribed. Participant is following exercise prescription and nutrition guidelines.;Long Term: Cholesterol controlled with medications as prescribed, with individualized exercise RX and with personalized nutrition plan. Value goals: LDL < 70mg , HDL > 40 mg.             Core Components/Risk Factors/Patient Goals Review:   Goals and Risk Factor Review     Row Name 06/15/23 [id number] 07/06/23 0806 08/03/23 0805 09/02/23 0832 10/28/23 0804     Core Components/Risk Factors/Patient Goals Review   Personal Goals Review Improve shortness of breath with ADL's;Weight Management/Obesity Improve shortness of breath with ADL's;Weight Management/Obesity;Increase knowledge of respiratory medications and ability to use respiratory devices properly. Improve shortness of breath with ADL's;Weight Management/Obesity;Increase knowledge of respiratory medications and ability to use respiratory devices properly.;Hypertension Improve shortness of breath with ADL's;Weight Management/Obesity;Increase knowledge of respiratory medications and ability to use respiratory devices properly.;Hypertension Improve shortness of breath with ADL's;Weight Management/Obesity;Increase knowledge of respiratory medications and ability to use respiratory devices properly.;Hypertension   Review Jennifier is doing good with exercise. She is working on [employer] with exercising by walking everyday and eating healthy. She stated that she wants to keep around the same weight she is and does not want to gain. She is also focusing  on PLB to help improve her SOB. She has noticed she breaths through her mouth a lot and is trying to remeber PLB. She stated that she has felt better since exercising. Tarrah is doing well in rehab.  She feels like her breathing is getting better overall.  She is able to do more with less SOB.  She does still see her saturations dropping but they do recover with PLB.  She is doing well on her meds.  She was excited about her weight today.  She was under 140 lb today!!  She is doing better with her diet and it is finally starting to pay off. Monya is doing well in rehab.  Her weight continues to come down.  She is pleased that it is showing on scale.  She goes to doctor on Wednesday and eager to show off her progress in rehab.  Her breathing has gotten better but her saturations are still on the lower side.  She is doing well on her meds.  Her blood pressures have been good.  She is going to take report with her to her appt. Rashawn is doing well in rehab. Her weight as came down some and she is pleased. She is breathing a little better during exericse she states but her stats are lower in the 86-88%. Thye drop whe she is talking while exericsing the most. Her blood pressure continues to be [employer]. Rashad is nearing graduation.  Her weight was up today from eating out, but had been holding steady.  Her pressures continue to do well.  She is doing well on her meds.  She is doing well with her breathing, although her satuarations do drop with walking and she needs to make sure  her hands are warm.   Expected Outcomes Short term: comtinue to focuse of PLB and weight managment   long term: continue to enjoy exercising and walking at home Short: Conitnue to work on breathing Long: Conitnue to work on [employer] loss Short: Take her report to doctor on Wednesday Long: Continue to work on breathing Short: continue to montior her oxygen stats  long term: use PLB when exericing and montiro BP Short; Continue to monitor oxygen  levels with exercise Long: Conitnue to monitor risk factors.            Core Components/Risk Factors/Patient Goals at Discharge (Final Review):   Goals and Risk Factor Review - 10/28/23 0804       Core Components/Risk Factors/Patient Goals Review   Personal Goals Review Improve shortness of breath with ADL's;Weight Management/Obesity;Increase knowledge of respiratory medications and ability to use respiratory devices properly.;Hypertension    Review Anelise is nearing graduation.  Her weight was up today from eating out, but had been holding steady.  Her pressures continue to do well.  She is doing well on her meds.  She is doing well with her breathing, although her satuarations do drop with walking and she needs to make sure her hands are warm.    Expected Outcomes Short; Continue to monitor oxygen levels with exercise Long: Conitnue to monitor risk factors.             ITP Comments:  ITP Comments     Row Name 06/08/23 [phone] 07/01/23 0643 07/29/23 0647 08/26/23 0810 09/22/23 0734   ITP Comments First full day of exercise!  Patient was oriented to gym and equipment including functions, settings, policies, and procedures.  Patient's individual exercise prescription and treatment plan were reviewed.  All starting workloads were established based on the results of the 6 minute walk test done at initial orientation visit.  The plan for exercise progression was also introduced and progression will be customized based on patient's performance and goals 30 day review completed. ITP sent to Dr.Jehanzeb Memon, Medical Director of  Pulmonary Rehab. Continue with ITP unless changes are made by physician. 30 day review completed. ITP sent to Dr.Jehanzeb Memon, Medical Director of  Pulmonary Rehab. Continue with ITP unless changes are made by physician. 30 day review completed. ITP sent to Dr.Jehanzeb Memon, Medical Director of  Pulmonary Rehab. Continue with ITP unless changes are made by physician.  30 day review completed. ITP sent to Dr.Jehanzeb Memon, Medical Director of  Pulmonary Rehab. Continue with ITP unless changes are made by physician.    Row Name 10/21/23 0803 10/21/23 0820 11/09/23 0805       ITP Comments First time returning in January.  Has not attened since 09/21/23. 30 day review completed. ITP sent to Dr.Jehanzeb Memon, Medical Director of  Pulmonary Rehab. Continue with ITP unless changes are made by physician.  Pt has not attended this month until today.  Unable to assess for goals. Abagayle graduated today from  rehab with 36 sessions completed.  Details of the patient's exercise prescription and what She needs to do in order to continue the prescription and progress were discussed with patient.  Patient was given a copy of prescription and goals.  Patient verbalized understanding. Reigna plans to continue to exercise by walking at home.              Comments: Discharge ITP

## 2023-11-11 ENCOUNTER — Encounter (HOSPITAL_COMMUNITY): Payer: Medicare Other

## 2023-11-16 ENCOUNTER — Encounter (HOSPITAL_COMMUNITY): Payer: Medicare Other

## 2023-11-18 ENCOUNTER — Encounter (HOSPITAL_COMMUNITY): Payer: Medicare Other

## 2023-11-25 DIAGNOSIS — K080 Exfoliation of teeth due to systemic causes: Secondary | ICD-10-CM | POA: Diagnosis not present

## 2023-12-29 DIAGNOSIS — K080 Exfoliation of teeth due to systemic causes: Secondary | ICD-10-CM | POA: Diagnosis not present

## 2024-01-13 NOTE — Progress Notes (Unsigned)
 [name], female    DOB: 09-19-1961    MRN: [id number]   Brief patient profile:  63   yowf  quit smoking 2007  former [place] [facility] pt self-referred back to pulmonary clinic in [place]  01/14/2024  for copd GOLD 3      History of Present Illness  01/14/2024  Pulmonary/ 1st office eval/ [facility] / [facility] trelegy 100 maint / last prednisone  months prior to OV   Chief Complaint  Patient presents with   COPD  Dyspnea:  changing bird feeders/ steps are problem/ sob coming back from [place] x 250 ft uphill back to house w/o stopping  Cough: none  Sleep: flat bed one pillow  SABA use: hfa and neb but rarely needing either  02 YNW:GNFA  LDSCT:done July 2024 though she is out > 15 y   No obvious day to day or daytime pattern/variability or assoc excess/ purulent sputum or mucus plugs or hemoptysis or cp or chest tightness, subjective wheeze or overt sinus or hb symptoms.    Also denies any obvious fluctuation of symptoms with weather or environmental changes or other aggravating or alleviating factors except as outlined above   No unusual exposure hx or h/o childhood pna/ asthma or knowledge of premature birth.  Current Allergies, Complete Past Medical History, Past Surgical History, Family History, and Social History were reviewed in [employer] record.  ROS  The following are not active complaints unless bolded Hoarseness, sore throat, dysphagia, dental problems, itching, sneezing,  nasal congestion or discharge of excess mucus or purulent secretions, ear ache,   fever, chills, sweats, unintended wt loss or wt gain, classically pleuritic or exertional cp,  orthopnea pnd or arm/hand swelling  or leg swelling, presyncope, palpitations, abdominal pain, anorexia, nausea, vomiting, diarrhea  or change in bowel habits or change in bladder habits, change in stools or change in urine, dysuria, hematuria,  rash, arthralgias, visual complaints, headache, numbness, weakness or  ataxia or problems with walking or coordination,  change in mood or  memory.            Outpatient Medications Prior to Visit  Medication Sig Dispense Refill   albuterol  (PROVENTIL ) (2.5 MG/3ML) 0.083% nebulizer solution USE 1 VIAL IN NEBULIZER EVERY 4 HOURS AS NEEDED FOR SHORTNESS OF BREATH 360 mL 0   albuterol  (VENTOLIN  HFA) 108 (90 Base) MCG/ACT inhaler Inhale 2 puffs into the lungs every 4 (four) hours as needed for wheezing or shortness of breath. 8 g 5   aspirin 81 MG EC tablet Take 81 mg by mouth at bedtime.     Cholecalciferol (VITAMIN D3) 400 units CAPS Take 400 Units by mouth at bedtime.      ezetimibe  (ZETIA ) 10 MG tablet Take 1 tablet (10 mg total) by mouth daily. 90 tablet 3   Fluticasone -Umeclidin-Vilant (TRELEGY ELLIPTA ) 100-62.5-25 MCG/ACT AEPB Inhale 1 puff into the lungs daily. 1 each 11   levothyroxine  (SYNTHROID ) 125 MCG tablet 1 qam all days except Mondays  use 1/2 in the am. 90 tablet 1   metoprolol  succinate (TOPROL -XL) 25 MG 24 hr tablet 1/2 qd 90 tablet 3   Multiple Vitamin (MULTIVITAMIN) tablet Take 1 tablet by mouth daily.     nitroGLYCERIN  (NITROSTAT ) 0.4 MG SL tablet Place 1 tablet (0.4 mg total) under the tongue every 5 (five) minutes x 3 doses as needed for chest pain (if no relief after 3rd dose, proceed to ED or call 911). 25 tablet 3   pantoprazole  (PROTONIX ) 40  MG tablet Take 1 tablet (40 mg total) by mouth daily. 90 tablet 1   rosuvastatin  (CRESTOR ) 40 MG tablet Take 1 tablet (40 mg total) by mouth daily. 90 tablet 1   amoxicillin -clavulanate (AUGMENTIN ) 875-125 MG tablet Take 1 tablet by mouth 2 (two) times daily. (Patient not taking: Reported on 01/14/2024) 14 tablet 0   predniSONE  (DELTASONE ) 20 MG tablet 2 every day for 5 days (Patient not taking: Reported on 01/14/2024) 10 tablet 0   vitamin C (ASCORBIC ACID) 500 MG tablet Take 500 mg by mouth daily. (Patient not taking: Reported on 01/14/2024)     Facility-Administered Medications Prior to Visit   Medication Dose Route Frequency Provider Last Rate Last Admin   [facility] dose#1 phenylephrine  1%/ketorolac  0.3% (OMIDRIA ) in BSS 500 ML (MUST DILUTE PRIOR TO USE) [employer]    PRN Ardeth Krabbe, MD   500 mL at 11/14/22 1054    Past Medical History:  Diagnosis Date   COPD (chronic obstructive pulmonary disease) (HCC)    Coronary atherosclerosis of native coronary artery    DES RCA 5/09, nonobstructive left system   Essential hypertension    Hypothyroidism    Mixed hyperlipidemia    NSTEMI (non-ST elevated myocardial infarction) (HCC)    2009      Objective:     BP 122/71   Pulse 63   Ht 5\' 1"  (1.549 m)   Wt 135 lb 4 oz (61.3 kg)   SpO2 92% Comment: RA  BMI 25.56 kg/m   SpO2: 92 % (RA)  Amb wf nad   HEENT : Oropharynx  clear/ no thrush   Nasal turbinates nl    NECK :  without  apparent JVD/ palpable Nodes/TM    LUNGS: no acc muscle use,  Mild barrel  contour chest wall with bilateral  Distant bs s audible wheeze and  without cough on insp or exp maneuvers  and mild  Hyperresonant  to  percussion bilaterally     CV:  RRR  no s3 or murmur or increase in P2, and no edema   ABD:  soft and nontender with pos end  insp Hoover's  in the supine position.  No bruits or organomegaly appreciated   MS:  Nl gait/ ext warm without deformities Or obvious joint restrictions  calf tenderness, cyanosis or clubbing     SKIN: warm and dry without lesions    NEURO:  alert, approp, nl sensorium with  no motor or cerebellar deficits apparent.     I personally reviewed images and agree with radiology impression as follows:   Chest LDSCT     04/14/24       1. Lung-RADS 2, benign appearance or behavior. Continue annual screening with low-dose chest CT without contrast in 12 months. 2. Aortic Atherosclerosis (ICD10-I70.0) and Emphysema (ICD10-J43.9).    Assessment   COPD GOLD 2 Quit smoking 2007  - LDSCT   04/15/23  Mod Centrilobular emphysema  -  PFT's  05/12/23  FEV1 0.81 (37  % ) ratio 0.41  p 6 % improvement from saba p ? Trelegy 100  prior to study with DLCO  7.51 (42%)   and FV curve classically concave   - 01/14/2024  After extensive coaching inhaler device,  effectiveness =    25% from a baseline near 0 (short ti, late trigger)    Group D (now reclassified as E) in terms of symptom/risk and laba/lama/ICS  therefore appropriate rx at this point >>>  continue trelegy 100 and approp saba  hfa/ neb but work on tecnique   Re SABA :  I spent extra time with pt today reviewing appropriate use of albuterol  for prn use on exertion with the following points: 1) saba is for relief of sob that does not improve by walking a slower pace or resting but rather if the pt does not improve after trying this first. 2) If the pt is convinced, as many are, that saba helps recover from activity faster then it's easy to tell if this is the case by re-challenging : ie stop, take the inhaler, then p 5 minutes try the exact same activity (intensity of workload) that just caused the symptoms and see if they are substantially diminished or not after saba 3) if there is an activity that reproducibly causes the symptoms, try the saba 15 min before the activity on alternate days   If in fact the saba really does help, then fine to continue to use it prn but advised may need to look closer at the maintenance regimen being used to achieve better control of airways disease with exertion.     Multiple pulmonary nodules determined by computed tomography of lung Quite smoking 2007  - see last ldsct 04/15/23:   largest nodule = 6.8 mm >>> rec yearly f/u   She is out > 52 y but radiology recs continue with the LDSCT program another year/ advised   Discussed in detail all the  indications, usual  risks and alternatives  relative to the benefits with patient who agrees to proceed with w/u as outlined.       Each maintenance medication was reviewed in detail including emphasizing most importantly the  difference between maintenance and prns and under what circumstances the prns are to be triggered using an action plan format where appropriate.  Total time for H and P, chart review, counseling, reviewing hfa/dpi/neb  device(s) and generating customized AVS unique to this office visit / same day charting = 40 min eval with pt new to me wishing to establish for long term f/u for severe copd.            Vernestine Gondola, MD 01/14/2024

## 2024-01-14 ENCOUNTER — Other Ambulatory Visit: Payer: Self-pay

## 2024-01-14 ENCOUNTER — Telehealth: Payer: Self-pay | Admitting: Family Medicine

## 2024-01-14 ENCOUNTER — Ambulatory Visit: Admitting: Internal Medicine

## 2024-01-14 ENCOUNTER — Encounter: Payer: Self-pay | Admitting: Internal Medicine

## 2024-01-14 VITALS — BP 122/71 | HR 63 | Ht 61.0 in | Wt 135.2 lb

## 2024-01-14 DIAGNOSIS — Z79899 Other long term (current) drug therapy: Secondary | ICD-10-CM | POA: Diagnosis not present

## 2024-01-14 DIAGNOSIS — E038 Other specified hypothyroidism: Secondary | ICD-10-CM

## 2024-01-14 DIAGNOSIS — E782 Mixed hyperlipidemia: Secondary | ICD-10-CM | POA: Diagnosis not present

## 2024-01-14 DIAGNOSIS — R918 Other nonspecific abnormal finding of lung field: Secondary | ICD-10-CM | POA: Insufficient documentation

## 2024-01-14 DIAGNOSIS — I10 Essential (primary) hypertension: Secondary | ICD-10-CM | POA: Diagnosis not present

## 2024-01-14 DIAGNOSIS — Z87891 Personal history of nicotine dependence: Secondary | ICD-10-CM

## 2024-01-14 DIAGNOSIS — J4489 Other specified chronic obstructive pulmonary disease: Secondary | ICD-10-CM

## 2024-01-14 DIAGNOSIS — J439 Emphysema, unspecified: Secondary | ICD-10-CM

## 2024-01-14 NOTE — Assessment & Plan Note (Signed)
 Quit smoking 2007  - LDSCT   04/15/23  Mod Centrilobular emphysema  -  PFT's  05/12/23  FEV1 0.81 (37 % ) ratio 0.41  p 6 % improvement from saba p ? Trelegy 100  prior to study with DLCO  7.51 (42%)   and FV curve classically concave   - 01/14/2024  After extensive coaching inhaler device,  effectiveness =    25% from a baseline near 0 (short ti, late trigger)    Group D (now reclassified as E) in terms of symptom/risk and laba/lama/ICS  therefore appropriate rx at this point >>>  continue trelegy 100 and approp saba hfa/ neb but work on tecnique   Re SABA :  I spent extra time with pt today reviewing appropriate use of albuterol  for prn use on exertion with the following points: 1) saba is for relief of sob that does not improve by walking a slower pace or resting but rather if the pt does not improve after trying this first. 2) If the pt is convinced, as many are, that saba helps recover from activity faster then it's easy to tell if this is the case by re-challenging : ie stop, take the inhaler, then p 5 minutes try the exact same activity (intensity of workload) that just caused the symptoms and see if they are substantially diminished or not after saba 3) if there is an activity that reproducibly causes the symptoms, try the saba 15 min before the activity on alternate days   If in fact the saba really does help, then fine to continue to use it prn but advised may need to look closer at the maintenance regimen being used to achieve better control of airways disease with exertion.

## 2024-01-14 NOTE — Assessment & Plan Note (Signed)
 Quite smoking 2007  - see last ldsct 04/15/23:   largest nodule = 6.8 mm >>> rec yearly f/u   She is out > 23 y but radiology recs continue with the LDSCT program another year/ advised   Discussed in detail all the  indications, usual  risks and alternatives  relative to the benefits with patient who agrees to proceed with w/u as outlined.       Each maintenance medication was reviewed in detail including emphasizing most importantly the difference between maintenance and prns and under what circumstances the prns are to be triggered using an action plan format where appropriate.  Total time for H and P, chart review, counseling, reviewing hfa/dpi/neb  device(s) and generating customized AVS unique to this office visit / same day charting = 40 min eval with pt new to me wishing to establish for long term f/u for severe copd.

## 2024-01-14 NOTE — Patient Instructions (Addendum)
 Plan A = Automatic = Always=    Trelegy 100 one click and take several deep drags until get it all in   Plan B = Backup (to supplement plan A, not to replace it) Only use your albuterol  inhaler as a rescue medication to be used if you can't catch your breath by resting or doing a relaxed purse lip breathing pattern.  - The less you use it, the better it will work when you need it. - Ok to use the inhaler up to 2 puffs  every 4 hours if you must but call for appointment if use goes up over your usual need - Don't leave home without it !!  (think of it like the spare tire for your car)   - Work on inhaler technique:  relax and gently blow all the way out then take a nice smooth full deep breath back in, triggering the inhaler at same time you start breathing in.  Hold breath in for at least  5 seconds if you can.     Plan C = Crisis (instead of Plan B but only if Plan B stops working) - only use your albuterol  nebulizer if you first try Plan B and it fails to help > ok to use the nebulizer up to every 4 hours but if start needing it regularly call for immediate appointment   Please schedule a follow up visit in 6  months but call sooner if needed

## 2024-01-14 NOTE — Telephone Encounter (Signed)
 Please order lipid, liver, metabolic 7, TSH free T4  Hypothyroidism, hyperlipidemia, high risk medication

## 2024-01-15 LAB — HEPATIC FUNCTION PANEL
ALT: 14 IU/L (ref 0–32)
AST: 22 IU/L (ref 0–40)
Albumin: 4.5 g/dL (ref 3.9–4.9)
Alkaline Phosphatase: 72 IU/L (ref 44–121)
Bilirubin Total: 0.8 mg/dL (ref 0.0–1.2)
Bilirubin, Direct: 0.24 mg/dL (ref 0.00–0.40)
Total Protein: 6.8 g/dL (ref 6.0–8.5)

## 2024-01-15 LAB — BASIC METABOLIC PANEL WITH GFR
BUN/Creatinine Ratio: 20 (ref 12–28)
BUN: 14 mg/dL (ref 8–27)
CO2: 20 mmol/L (ref 20–29)
Calcium: 9.2 mg/dL (ref 8.7–10.3)
Chloride: 104 mmol/L (ref 96–106)
Creatinine, Ser: 0.71 mg/dL (ref 0.57–1.00)
Glucose: 91 mg/dL (ref 70–99)
Potassium: 4.5 mmol/L (ref 3.5–5.2)
Sodium: 144 mmol/L (ref 134–144)
eGFR: 94 mL/min/{1.73_m2} (ref >59)

## 2024-01-15 LAB — LIPID PANEL
Chol/HDL Ratio: 2.2 ratio (ref 0.0–4.4)
Cholesterol, Total: 134 mg/dL (ref 100–199)
HDL: 62 mg/dL (ref >39)
LDL Chol Calc (NIH): 53 mg/dL (ref 0–99)
Triglycerides: 104 mg/dL (ref 0–149)
VLDL Cholesterol Cal: 19 mg/dL (ref 5–40)

## 2024-01-15 LAB — TSH+FREE T4
Free T4: 1.8 ng/dL — ABNORMAL HIGH (ref 0.82–1.77)
TSH: 0.147 u[IU]/mL — ABNORMAL LOW (ref 0.450–4.500)

## 2024-01-18 ENCOUNTER — Encounter: Payer: Self-pay | Admitting: Family Medicine

## 2024-01-19 DIAGNOSIS — K080 Exfoliation of teeth due to systemic causes: Secondary | ICD-10-CM | POA: Diagnosis not present

## 2024-01-21 ENCOUNTER — Ambulatory Visit (INDEPENDENT_AMBULATORY_CARE_PROVIDER_SITE_OTHER): Payer: Medicare Other | Admitting: Family Medicine

## 2024-01-21 ENCOUNTER — Encounter: Payer: Self-pay | Admitting: *Deleted

## 2024-01-21 ENCOUNTER — Encounter: Payer: Self-pay | Admitting: Family Medicine

## 2024-01-21 VITALS — BP 130/74 | HR 56 | Temp 97.2°F | Ht 61.0 in | Wt 135.4 lb

## 2024-01-21 DIAGNOSIS — E782 Mixed hyperlipidemia: Secondary | ICD-10-CM | POA: Diagnosis not present

## 2024-01-21 DIAGNOSIS — I10 Essential (primary) hypertension: Secondary | ICD-10-CM

## 2024-01-21 DIAGNOSIS — E038 Other specified hypothyroidism: Secondary | ICD-10-CM

## 2024-01-21 DIAGNOSIS — Z23 Encounter for immunization: Secondary | ICD-10-CM

## 2024-01-21 MED ORDER — PANTOPRAZOLE SODIUM 40 MG PO TBEC: 40.0000 mg | DELAYED_RELEASE_TABLET | Freq: Every day | ORAL | 1 refills | Status: DC

## 2024-01-21 MED ORDER — METOPROLOL SUCCINATE ER 25 MG PO TB24: ORAL_TABLET | ORAL | 3 refills | Status: AC

## 2024-01-21 MED ORDER — ROSUVASTATIN CALCIUM 40 MG PO TABS: 40.0000 mg | ORAL_TABLET | Freq: Every day | ORAL | 1 refills | Status: DC

## 2024-01-21 MED ORDER — ALBUTEROL SULFATE HFA 108 (90 BASE) MCG/ACT IN AERS: 2.0000 | INHALATION_SPRAY | RESPIRATORY_TRACT | 12 refills | Status: AC | PRN

## 2024-01-21 MED ORDER — LEVOTHYROXINE SODIUM 125 MCG PO TABS: ORAL_TABLET | ORAL | 1 refills | Status: DC

## 2024-01-21 NOTE — Progress Notes (Signed)
 Subjective:    Patient ID: [name], female    DOB: [birth year], 63 y.o.   MRN: [id number]  HPI 6 month f/u    Hyperlipidemia  . COPD- breathing is better  Discussed the use of AI scribe software for clinical note transcription with the patient, who gave verbal consent to proceed.  History of Present Illness   [name] is a 63 year old female who presents for a follow-up visit.  After receiving the RSV vaccine, she has experienced fewer respiratory flares compared to last year. She saw her lung doctor last week and had blood work done on the same day. She is scheduled for a follow-up in six months, with the possibility of extending it to a year. Her current medications include Trelegy and an albuterol  inhaler that she uses infrequently.  She is working on her diet and exercise regimen, walking up to 45 minutes a day and doing additional exercises. She has significantly reduced her intake of potato chips, having only three bags this year. Her blood work showed good results, including liver enzymes, kidney function, sodium, potassium, and cholesterol levels. She has stopped taking an additional cholesterol medication as her current regimen appears effective.  Her thyroid  medication regimen includes taking one tablet daily except on Mondays and Fridays when she takes half a tablet. Recent blood work indicates her TSH is low and T4 is high. She has adjusted her dosing schedule accordingly.  She lives near her nephew and his wife, occasionally helping with their beagle. She walks indoors, around her house, and sometimes outside, depending on the weather.      Review of Systems     Objective:   Physical Exam General-in no acute distress Eyes-no discharge Lungs-respiratory rate normal, CTA CV-no murmurs,RRR Extremities skin warm dry no edema Neuro grossly normal Behavior normal, alert        Assessment & Plan:   Assessment and Plan    Thyroid  medication  adjustment Thyroid  medication dose too strong, indicated by low TSH and high T4 levels. - Adjust thyroid  medication to half a tablet on Mondays and Fridays, and one tablet on all other days. - Repeat thyroid  blood test at the end of July.  Hypertension Hypertension well-controlled with current metoprolol  regimen. - Continue metoprolol  at current dose of half tablet daily.  Hyperlipidemia Cholesterol levels well-controlled with current rosuvastatin  regimen. - Continue rosuvastatin  at current dose taken nightly. - Discontinue additional cholesterol medication.  Chronic obstructive pulmonary disease (COPD) COPD well-managed with current treatment regimen. - Continue Trelegy as prescribed. - Ensure albuterol  inhaler is available with plenty of refills.  Gastroesophageal reflux disease (GERD) GERD managed with current acid blocker regimen. - Continue current acid blocker medication.  Wellness Visit Actively managing health with lifestyle modifications. Blood pressure and blood work are satisfactory. - Continue current exercise regimen, including walking 45 minutes daily. - Maintain current diet with increased fruit intake and reduced potato chip consumption.  General Health Maintenance Immunizations up to date. Discussed updated pneumonia vaccine. - Administer updated pneumonia vaccine at her convenience. - Plan for flu shot in the fall.      1. Essential hypertension, benign (Primary) Blood pressure good control continue current medication healthy diet  2. Mixed hyperlipidemia Patient has done a great job of exercising healthy diet labs look great stop Zetia  continue statin  3. Other specified hypothyroidism Continue thyroid  medicine adjust the dose accordingly, half tablet on Monday half tablet Friday, 1 tablet on all other days Recheck labs  again in 8 to 10 weeks - TSH + free T4  4. Immunization due Today - Pneumococcal conjugate vaccine 20-valent (Prevnar  20)  Follow-up 6 months

## 2024-01-29 ENCOUNTER — Ambulatory Visit: Payer: Medicare Other

## 2024-01-29 DIAGNOSIS — Z0000 Encounter for general adult medical examination without abnormal findings: Secondary | ICD-10-CM | POA: Diagnosis not present

## 2024-01-29 DIAGNOSIS — Z780 Asymptomatic menopausal state: Secondary | ICD-10-CM

## 2024-01-29 NOTE — Patient Instructions (Signed)
 [name] , Thank you for taking time out of your busy schedule to complete your Annual Wellness Visit with me. I enjoyed our conversation and look forward to speaking with you again next year. I, as well as your care team,  appreciate your ongoing commitment to your health goals. Please review the following plan we discussed and let me know if I can assist you in the future.  Referrals: If you haven't heard from the office you've been referred to, please reach out to them at the phone provided.  You have an order for:  []   2D Mammogram  []   3D Mammogram  [x]   Bone Density     Please call for appointment:  [facility] [address]iology [place], [place] [address] [phone] [facility] [address], [place] [address] [phone]   Make sure to wear two-piece clothing.  No lotions, powders, or deodorants the day of the appointment. Make sure to bring picture ID and insurance card.  Bring list of medications you are currently taking including any supplements.   Schedule your [facility] screening mammogram through MyChart!   Log into your MyChart account.  Go to 'Visit' (or 'Appointments' if on mobile App) --> Schedule an Appointment  Under 'Select a Reason for Visit' choose the Mammogram Screening option.  Complete the pre-visit questions and select the time and place that best fits your schedule.   Follow up Visits: Next Medicare AWV with our clinical staff:  02/03/25 @ 9:20 AM BY PHONE   Have you seen your provider in the last 6 months (3 months if uncontrolled diabetes)? Yes   Clinician Recommendations:  Aim for 30 minutes of exercise or brisk walking, 6-8 glasses of water , and 5 servings of fruits and vegetables each day. TAKE CARE!      This is a list of the screening recommended for you and due dates:  Health Maintenance  Topic Date Due   DEXA scan (bone density measurement)  Never done   COVID-19 Vaccine (3  - Moderna risk series) 02/06/2024*   Zoster (Shingles) Vaccine (1 of 2) 04/22/2024*   Flu Shot  04/22/2024   Colon Cancer Screening  10/16/2024   Mammogram  10/26/2024   Medicare Annual Wellness Visit  01/28/2025   DTaP/Tdap/Td vaccine (3 - Tdap) 05/15/2031   Pneumonia Vaccine  Completed   Hepatitis C Screening  Completed   HIV Screening  Completed   HPV Vaccine  Aged Out   Meningitis B Vaccine  Aged Out  *Topic was postponed. The date shown is not the original due date.    Advanced directives: (ACP Link)Information on Advanced Care Planning can be found at [place]  Secretary of [facility] Advance Health Care Directives Advance Health Care Directives. [email or web address]  Advance Care Planning is important because it:  [x]  Makes sure you receive the medical care that is consistent with your values, goals, and preferences  [x]  It provides guidance to your family and loved ones and reduces their decisional burden about whether or not they are making the right decisions based on your wishes.  Follow the link provided in your after visit summary or read over the paperwork we have mailed to you to help you started getting your Advance Directives in place. If you need assistance in completing these, please reach out to us  so that we can help you!

## 2024-01-29 NOTE — Progress Notes (Signed)
 Subjective:   [name] is a 63 y.o. who presents for a Medicare Wellness preventive visit.  As a reminder, Annual Wellness Visits don't include a physical exam, and some assessments may be limited, especially if this visit is performed virtually. We may recommend an in-person visit if needed.  Visit Complete: Virtual I connected with  Alberto Alstrom on 01/29/24 by a audio enabled telemedicine application and verified that I am speaking with the correct person using two identifiers.  Patient Location: Home  Provider Location: Office/Clinic  I discussed the limitations of evaluation and management by telemedicine. The patient expressed understanding and agreed to proceed.  Vital Signs: Because this visit was a virtual/telehealth visit, some criteria may be missing or patient reported. Any vitals not documented were not able to be obtained and vitals that have been documented are patient reported.  VideoDeclined- This patient declined Librarian, academic. Therefore the visit was completed with audio only.  Persons Participating in Visit: Patient.  AWV Questionnaire: No: Patient Medicare AWV questionnaire was not completed prior to this visit.  Cardiac Risk Factors include: advanced age (>86men, >41 women);dyslipidemia;hypertension;smoking/ tobacco exposure     Objective:     There were no vitals filed for this visit. There is no height or weight on file to calculate BMI.     01/29/2024   11:41 AM 05/29/2023    9:35 AM 01/23/2023    9:28 AM 11/11/2022    1:57 PM 11/12/2021    9:42 AM 10/16/2021    8:32 AM 08/28/2021   12:53 PM  Advanced Directives  Does Patient Have a Medical Advance Directive? No No No No No No No  Would patient like information on creating a medical advance directive? No - Patient declined No - Patient declined No - Patient declined No - Patient declined No - Patient declined No - Patient declined Yes (MAU/Ambulatory/Procedural  Areas - Information given)    Current Medications (verified) Outpatient Encounter Medications as of 01/29/2024  Medication Sig   albuterol  (PROVENTIL ) (2.5 MG/3ML) 0.083% nebulizer solution USE 1 VIAL IN NEBULIZER EVERY 4 HOURS AS NEEDED FOR SHORTNESS OF BREATH   albuterol  (VENTOLIN  HFA) 108 (90 Base) MCG/ACT inhaler Inhale 2 puffs into the lungs every 4 (four) hours as needed for wheezing or shortness of breath.   aspirin 81 MG EC tablet Take 81 mg by mouth at bedtime.   Cholecalciferol (VITAMIN D3) 400 units CAPS Take 400 Units by mouth at bedtime.    Fluticasone -Umeclidin-Vilant (TRELEGY ELLIPTA ) 100-62.5-25 MCG/ACT AEPB Inhale 1 puff into the lungs daily.   levothyroxine  (SYNTHROID ) 125 MCG tablet 1 qam all days except Mondays and Fridays  use 1/2 in the am.   metoprolol  succinate (TOPROL -XL) 25 MG 24 hr tablet 1/2 qd   Multiple Vitamin (MULTIVITAMIN) tablet Take 1 tablet by mouth daily.   nitroGLYCERIN  (NITROSTAT ) 0.4 MG SL tablet Place 1 tablet (0.4 mg total) under the tongue every 5 (five) minutes x 3 doses as needed for chest pain (if no relief after 3rd dose, proceed to ED or call 911).   pantoprazole  (PROTONIX ) 40 MG tablet Take 1 tablet (40 mg total) by mouth daily.   rosuvastatin  (CRESTOR ) 40 MG tablet Take 1 tablet (40 mg total) by mouth daily.   No facility-administered encounter medications on file as of 01/29/2024.    Allergies (verified) Patient has no known allergies.   History: Past Medical History:  Diagnosis Date   COPD (chronic obstructive pulmonary disease) (HCC)  Coronary atherosclerosis of native coronary artery    DES RCA 5/09, nonobstructive left system   Essential hypertension    Hypothyroidism    Mixed hyperlipidemia    NSTEMI (non-ST elevated myocardial infarction) (HCC)    2009   Past Surgical History:  Procedure Laterality Date   BREAST LUMPECTOMY Right    CATARACT EXTRACTION W/PHACO Left 11/14/2022   Procedure: CATARACT EXTRACTION PHACO AND  INTRAOCULAR LENS PLACEMENT (IOC);  Surgeon: Ardeth Krabbe, MD;  Location: [facility];  Service: Ophthalmology;  Laterality: Left;  CDE: 6.04   CATARACT EXTRACTION W/PHACO Right 01/30/2023   Procedure: CATARACT EXTRACTION PHACO AND INTRAOCULAR LENS PLACEMENT (IOC);  Surgeon: Ardeth Krabbe, MD;  Location: [facility];  Service: Ophthalmology;  Laterality: Right;  CDE: 3.86   COLONOSCOPY N/A 05/26/2018   Procedure: COLONOSCOPY;  Surgeon: Ruby Corporal, MD;  Location: [facility];  Service: Endoscopy;  Laterality: N/A;  1030   COLONOSCOPY WITH PROPOFOL  N/A 10/16/2021   Procedure: COLONOSCOPY WITH PROPOFOL ;  Surgeon: Urban Garden, MD;  Location: [facility];  Service: Gastroenterology;  Laterality: N/A;  10:05   POLYPECTOMY  05/26/2018   Procedure: POLYPECTOMY;  Surgeon: Ruby Corporal, MD;  Location: [facility];  Service: Endoscopy;;  colon   POLYPECTOMY  10/16/2021   Procedure: POLYPECTOMY;  Surgeon: Urban Garden, MD;  Location: [facility];  Service: Gastroenterology;;   TOTAL ABDOMINAL HYSTERECTOMY     TUMOR REMOVAL Right    over right eye and has a plastic plate there.   Family History  Problem Relation Age of Onset   Pneumonia Brother        age 35   Cancer Brother        larynx - age 66   Pneumonia Brother        declining health - age 40   Social History   Socioeconomic History   Marital status: Married    Spouse name: Not on file   Number of children: Not on file   Years of education: Not on file   Highest education level: Not on file  Occupational History   Not on file  Tobacco Use   Smoking status: Former    Current packs/day: 0.00    Average packs/day: 3.0 packs/day for 33.0 years (99.0 ttl pk-yrs)    Types: Cigarettes    Start date: 10/27/1972    Quit date: 2007    Years since quitting: 18.3   Smokeless tobacco: Never  Vaping Use   Vaping status: Never Used  Substance and Sexual Activity   Alcohol use: No    Alcohol/week: 0.0  standard drinks of alcohol   Drug use: No   Sexual activity: Not Currently  Other Topics Concern   Not on file  Social History Narrative   Full time. Married. Regularly exercises    Social Drivers of Health   Financial Resource Strain: Low Risk  (01/29/2024)   Overall Financial Resource Strain (CARDIA)    Difficulty of Paying Living Expenses: Not hard at all  Food Insecurity: No Food Insecurity (01/29/2024)   Hunger Vital Sign    Worried About Running Out of Food in the Last Year: Never true    Ran Out of Food in the Last Year: Never true  Transportation Needs: No Transportation Needs (01/29/2024)   PRAPARE - Administrator, Civil Service (Medical): No    Lack of Transportation (Non-Medical): No  Physical Activity: Sufficiently Active (01/29/2024)   Exercise Vital Sign  Days of Exercise per Week: 7 days    Minutes of Exercise per Session: 40 min  Stress: No Stress Concern Present (01/29/2024)   [employer] of Occupational Health - Occupational Stress Questionnaire    Feeling of Stress : Not at all  Social Connections: Moderately Isolated (01/29/2024)   Social Connection and Isolation Panel [NHANES]    Frequency of Communication with Friends and Family: More than three times a week    Frequency of Social Gatherings with Friends and Family: More than three times a week    Attends Religious Services: Never    Database administrator or Organizations: No    Attends Engineer, structural: Never    Marital Status: Married    Tobacco Counseling Counseling given: Not Answered    Clinical Intake:  Pre-visit preparation completed: Yes  Pain : No/denies pain     BMI - recorded: 25.5 Nutritional Status: BMI 25 -29 Overweight Nutritional Risks: None Diabetes: No  No results found for: "HGBA1C"   How often do you need to have someone help you when you read instructions, pamphlets, or other written materials from your doctor or pharmacy?: 1 -  Never  Interpreter Needed?: No  Information entered by :: Dellie Fergusson, LPN   Activities of Daily Living    01/29/2024   11:43 AM  In your present state of health, do you have any difficulty performing the following activities:  Hearing? 0  Vision? 0  Difficulty concentrating or making decisions? 0  Walking or climbing stairs? 0  Dressing or bathing? 0  Doing errands, shopping? 0  Preparing Food and eating ? N  Using the Toilet? N  In the past six months, have you accidently leaked urine? N  Do you have problems with loss of bowel control? N  Managing your Medications? N  Managing your Finances? N  Housekeeping or managing your Housekeeping? N    Patient Care Team: Bennet Brasil, MD as PCP - General (Family Medicine) Gerard Knight, MD as PCP - Cardiology (Cardiology) Casimir Cleaver, MD as Obstetrician (Unknown Physician Specialty) [facility] , Juanell Nora, MD as Consulting Physician (Pulmonary Disease)  Indicate any recent Medical Services you may have received from other than [facility] providers in the past year (date may be approximate).     Assessment:    This is a routine wellness examination for Spring.  Hearing/Vision screen Hearing Screening - Comments:: NO AIDS Vision Screening - Comments:: HAD CATARACT SGY, WEARS READERS- MY EYE DOCTOR IN [place]   Goals Addressed             This Visit's Progress    DIET - EAT MORE FRUITS AND VEGETABLES         Depression Screen     01/29/2024   11:39 AM 01/21/2024    8:13 AM 11/03/2023   10:08 AM 09/03/2023    4:03 PM 08/05/2023   10:33 AM 06/04/2023    9:34 AM 03/05/2023    9:54 AM  PHQ 2/9 Scores  PHQ - 2 Score 0 0 0 0 0 0 0  PHQ- 9 Score 0 1 1 0 1 1 1     Fall Risk     01/29/2024   11:43 AM 01/21/2024    8:13 AM 08/05/2023   10:33 AM 05/29/2023    9:33 AM 03/05/2023    9:53 AM  Fall Risk   Falls in the past year? 0 0 0 0 0  Number falls  in past yr: 0  0 0 0  Injury with  Fall? 0  0 0 0  Risk for fall due to : No Fall Risks   No Fall Risks   Follow up Falls prevention discussed;Falls evaluation completed   Falls evaluation completed     MEDICARE RISK AT HOME:  Medicare Risk at Home Any stairs in or around the home?: Yes If so, are there any without handrails?: No Home free of loose throw rugs in walkways, pet beds, electrical cords, etc?: Yes Adequate lighting in your home to reduce risk of falls?: Yes Life alert?: No Use of a cane, walker or w/c?: No Grab bars in the bathroom?: No Shower chair or bench in shower?: No Elevated toilet seat or a handicapped toilet?: No  TIMED UP AND GO:  Was the test performed?  No  Cognitive Function: 6CIT completed        01/29/2024   11:45 AM 01/23/2023    9:28 AM 11/12/2021    9:46 AM  6CIT Screen  What Year?  0 points 0 points  What month?  0 points 0 points  What time? 0 points 0 points 0 points  Count back from 20 0 points 0 points 0 points  Months in reverse 0 points 0 points 0 points  Repeat phrase 0 points 0 points 0 points  Total Score  0 points 0 points    Immunizations Immunization History  Administered Date(s) Administered   Influenza, Seasonal, Injecte, Preservative Fre 08/05/2023   Influenza,inj,Quad PF,6+ Mos 07/27/2014, 07/09/2015, 07/21/2016, 07/21/2018, 07/23/2020, 08/06/2021, 07/09/2022   Influenza,inj,quad, With Preservative 08/17/2017, 06/14/2019   Influenza-Unspecified 08/17/2017, 06/14/2019   Moderna SARS-COV2 Booster Vaccination 07/11/2021   Moderna Sars-Covid-2 Vaccination 12/14/2019, 01/09/2020   PNEUMOCOCCAL CONJUGATE-20 01/21/2024   Pneumococcal Conjugate-13 08/28/2015   Pneumococcal Polysaccharide-23 08/23/2014   Td 06/10/2018, 05/14/2021    Screening Tests Health Maintenance  Topic Date Due   DEXA SCAN  Never done   COVID-19 Vaccine (3 - Moderna risk series) 02/06/2024 (Originally 08/08/2021)   Zoster Vaccines- Shingrix (1 of 2) 04/22/2024 (Originally 11/13/1979)    INFLUENZA VACCINE  04/22/2024   Colonoscopy  10/16/2024   MAMMOGRAM  10/26/2024   Medicare Annual Wellness (AWV)  01/28/2025   DTaP/Tdap/Td (3 - Tdap) 05/15/2031   Pneumonia Vaccine 78+ Years old  Completed   Hepatitis C Screening  Completed   HIV Screening  Completed   HPV VACCINES  Aged Out   Meningococcal B Vaccine  Aged Out    Health Maintenance  Health Maintenance Due  Topic Date Due   DEXA SCAN  Never done   Health Maintenance Items Addressed: DEXA ordered; UP TO DATE ON SHOTS EXCEPT SHINGRIX & COVID  Additional Screening:  Vision Screening: Recommended annual ophthalmology exams for early detection of glaucoma and other disorders of the eye.  Dental Screening: Recommended annual dental exams for proper oral hygiene  Community Resource Referral / Chronic Care Management: CRR required this visit?  No   CCM required this visit?  No   Plan:    I have personally reviewed and noted the following in the patient's chart:   Medical and social history Use of alcohol, tobacco or illicit drugs  Current medications and supplements including opioid prescriptions. Patient is not currently taking opioid prescriptions. Functional ability and status Nutritional status Physical activity Advanced directives List of other physicians Hospitalizations, surgeries, and ER visits in previous 12 months Vitals Screenings to include cognitive, depression, and falls Referrals and appointments  In addition,  I have reviewed and discussed with patient certain preventive protocols, quality metrics, and best practice recommendations. A written personalized care plan for preventive services as well as general preventive health recommendations were provided to patient.   Pinky Bright, LPN   01/25/2129   After Visit Summary: (MyChart) Due to this being a telephonic visit, the after visit summary with patients personalized plan was offered to patient via MyChart   Notes: DEXA  ORDERED

## 2024-02-05 ENCOUNTER — Ambulatory Visit: Payer: Medicare Other | Admitting: Family Medicine

## 2024-03-07 DIAGNOSIS — X32XXXD Exposure to sunlight, subsequent encounter: Secondary | ICD-10-CM | POA: Diagnosis not present

## 2024-03-07 DIAGNOSIS — L570 Actinic keratosis: Secondary | ICD-10-CM | POA: Diagnosis not present

## 2024-04-19 DIAGNOSIS — E038 Other specified hypothyroidism: Secondary | ICD-10-CM | POA: Diagnosis not present

## 2024-04-20 ENCOUNTER — Ambulatory Visit: Payer: Self-pay | Admitting: Family Medicine

## 2024-04-20 LAB — TSH+FREE T4
Free T4: 1.72 ng/dL (ref 0.82–1.77)
TSH: 0.434 u[IU]/mL — ABNORMAL LOW (ref 0.450–4.500)

## 2024-04-29 DIAGNOSIS — K080 Exfoliation of teeth due to systemic causes: Secondary | ICD-10-CM | POA: Diagnosis not present

## 2024-05-09 DIAGNOSIS — K080 Exfoliation of teeth due to systemic causes: Secondary | ICD-10-CM | POA: Diagnosis not present

## 2024-05-11 DIAGNOSIS — K080 Exfoliation of teeth due to systemic causes: Secondary | ICD-10-CM | POA: Diagnosis not present

## 2024-06-29 ENCOUNTER — Telehealth: Payer: Self-pay | Admitting: *Deleted

## 2024-06-29 NOTE — Telephone Encounter (Unsigned)
 Copied from CRM #[id number]. Topic: Appointments - Appointment Info/Confirmation >> Jun 29, 2024  8:58 AM Montie POUR wrote: Patient/patient representative is calling for information regarding an appointment.  [name] has an appointment on 07/25/24 and she wants to know if she needs to come in a week early for blood work. No orders are in her chart. Please call her at [phone].

## 2024-06-29 NOTE — Telephone Encounter (Signed)
 Please order TSH Also order free T4, med 7, lipid Hyperlipidemia, high risk med, hypothyroidism-do these test at the end of October

## 2024-06-30 ENCOUNTER — Other Ambulatory Visit: Payer: Self-pay

## 2024-06-30 DIAGNOSIS — Z79899 Other long term (current) drug therapy: Secondary | ICD-10-CM

## 2024-06-30 DIAGNOSIS — E782 Mixed hyperlipidemia: Secondary | ICD-10-CM

## 2024-06-30 DIAGNOSIS — E038 Other specified hypothyroidism: Secondary | ICD-10-CM

## 2024-07-11 DIAGNOSIS — E038 Other specified hypothyroidism: Secondary | ICD-10-CM | POA: Diagnosis not present

## 2024-07-11 DIAGNOSIS — Z79899 Other long term (current) drug therapy: Secondary | ICD-10-CM | POA: Diagnosis not present

## 2024-07-11 DIAGNOSIS — E782 Mixed hyperlipidemia: Secondary | ICD-10-CM | POA: Diagnosis not present

## 2024-07-12 DIAGNOSIS — K080 Exfoliation of teeth due to systemic causes: Secondary | ICD-10-CM | POA: Diagnosis not present

## 2024-07-12 LAB — LIPID PANEL
Chol/HDL Ratio: 2.5 ratio (ref 0.0–4.4)
Cholesterol, Total: 141 mg/dL (ref 100–199)
HDL: 56 mg/dL (ref >39)
LDL Chol Calc (NIH): 63 mg/dL (ref 0–99)
Triglycerides: 125 mg/dL (ref 0–149)
VLDL Cholesterol Cal: 22 mg/dL (ref 5–40)

## 2024-07-12 LAB — BASIC METABOLIC PANEL WITH GFR
BUN/Creatinine Ratio: 18 (ref 12–28)
BUN: 12 mg/dL (ref 8–27)
CO2: 26 mmol/L (ref 20–29)
Calcium: 9.4 mg/dL (ref 8.7–10.3)
Chloride: 106 mmol/L (ref 96–106)
Creatinine, Ser: 0.68 mg/dL (ref 0.57–1.00)
Glucose: 96 mg/dL (ref 70–99)
Potassium: 4.6 mmol/L (ref 3.5–5.2)
Sodium: 142 mmol/L (ref 134–144)
eGFR: 97 mL/min/1.73 (ref >59)

## 2024-07-12 LAB — TSH+FREE T4
Free T4: 1.75 ng/dL (ref 0.82–1.77)
TSH: 0.287 u[IU]/mL — ABNORMAL LOW (ref 0.450–4.500)

## 2024-07-13 ENCOUNTER — Ambulatory Visit: Payer: Self-pay | Admitting: Family Medicine

## 2024-07-25 ENCOUNTER — Ambulatory Visit (INDEPENDENT_AMBULATORY_CARE_PROVIDER_SITE_OTHER): Admitting: Family Medicine

## 2024-07-25 ENCOUNTER — Encounter: Payer: Self-pay | Admitting: Family Medicine

## 2024-07-25 VITALS — BP 118/76 | HR 63 | Temp 97.2°F | Ht 61.0 in | Wt 132.0 lb

## 2024-07-25 DIAGNOSIS — Z23 Encounter for immunization: Secondary | ICD-10-CM | POA: Diagnosis not present

## 2024-07-25 DIAGNOSIS — D692 Other nonthrombocytopenic purpura: Secondary | ICD-10-CM | POA: Diagnosis not present

## 2024-07-25 DIAGNOSIS — Z780 Asymptomatic menopausal state: Secondary | ICD-10-CM

## 2024-07-25 DIAGNOSIS — E782 Mixed hyperlipidemia: Secondary | ICD-10-CM | POA: Diagnosis not present

## 2024-07-25 DIAGNOSIS — J449 Chronic obstructive pulmonary disease, unspecified: Secondary | ICD-10-CM

## 2024-07-25 MED ORDER — LEVOTHYROXINE SODIUM 125 MCG PO TABS: ORAL_TABLET | ORAL | 1 refills | Status: AC

## 2024-07-25 MED ORDER — PANTOPRAZOLE SODIUM 40 MG PO TBEC: 40.0000 mg | DELAYED_RELEASE_TABLET | Freq: Every day | ORAL | 1 refills | Status: AC

## 2024-07-25 MED ORDER — ROSUVASTATIN CALCIUM 40 MG PO TABS: 40.0000 mg | ORAL_TABLET | Freq: Every day | ORAL | 1 refills | Status: AC

## 2024-07-25 NOTE — Progress Notes (Signed)
 Subjective:    Patient ID: [name], female    DOB: [birth year], 63 y.o.   MRN: [id number]  HPI 6 month follow up  Medication refills [facility] in [place] Flu shot  Discussed the use of AI scribe software for clinical note transcription with the patient, who gave verbal consent to proceed.  History of Present Illness   [name] is a 63 year old female with COPD who presents for a routine follow-up visit.  Her breathing has improved significantly with no recent exacerbations of COPD. She attributes this improvement to weight loss and healthier eating habits, including reducing salt intake and drinking mostly water . She walks regularly, aiming for 40 minutes daily, and has maintained this routine despite colder weather by walking indoors. No shortness of breath during these activities. She uses Trelegy regularly and albuterol  infrequently.  Her bowel movements are regular without any blood. She is able to rest a few hours at night and reports no issues with acid reflux. She takes an acid blocker daily and reports no trouble with acid.  She is consistent with her metoprolol , taking a half tablet daily, and takes her cholesterol medication regularly. She takes levothyroxine  every morning, adjusting the dose to half a tablet on Monday and Friday, and a whole tablet on other days.  She mentions that her husband recently underwent a colonoscopy, and she assisted him with the preparation process. She is due for a colonoscopy next year and expresses some apprehension about the preparation process.  Her family history includes her husband having had shingles on his face. She has not yet received the shingles vaccine.she will get the vaccine through the pharmacy      Review of Systems     Objective:   Physical Exam General-in no acute distress Eyes-no discharge Lungs-respiratory rate normal, CTA CV-no murmurs,RRR Extremities skin warm dry no edema Neuro grossly normal Behavior  normal, alert   Results for orders placed or performed in visit on 06/30/24  TSH + free T4   Collection Time: 07/11/24  9:39 AM  Result Value Ref Range   TSH 0.287 (L) 0.450 - 4.500 uIU/mL   Free T4 1.75 0.82 - 1.77 ng/dL  Basic Metabolic Panel (BMET)   Collection Time: 07/11/24  9:39 AM  Result Value Ref Range   Glucose 96 70 - 99 mg/dL   BUN 12 8 - 27 mg/dL   Creatinine, Ser 9.31 0.57 - 1.00 mg/dL   eGFR 97 >40 fO/fpw/8.26   BUN/Creatinine Ratio 18 12 - 28   Sodium 142 134 - 144 mmol/L   Potassium 4.6 3.5 - 5.2 mmol/L   Chloride 106 96 - 106 mmol/L   CO2 26 20 - 29 mmol/L   Calcium  9.4 8.7 - 10.3 mg/dL  Lipid Profile   Collection Time: 07/11/24  9:39 AM  Result Value Ref Range   Cholesterol, Total 141 100 - 199 mg/dL   Triglycerides 874 0 - 149 mg/dL   HDL 56 >60 mg/dL   VLDL Cholesterol Cal 22 5 - 40 mg/dL   LDL Chol Calc (NIH) 63 0 - 99 mg/dL   Chol/HDL Ratio 2.5 0.0 - 4.4 ratio   Lab work reviewed in detail with the patient  Chronic Obstructive Pulmonary Disease (COPD) COPD well-managed with symptom improvement due to weight loss and lifestyle changes. Regular Trelegy use, infrequent albuterol  use. - Continue Trelegy as prescribed. - Use albuterol  as needed.  Hypothyroidism TSH levels indicate overmedication with levothyroxine , requiring dosage adjustment. - Adjust  levothyroxine  to half tablet on Monday, Wednesday, and Friday. she was doing half tablet 2 times a day - Re-evaluate thyroid  function in 6 months.  Hypertension Blood pressure well-controlled at 118/76 mmHg. - Continue metoprolol  as prescribed.  Hyperlipidemia Cholesterol levels well-controlled with LDL below target. - Continue current cholesterol medication. - Refill cholesterol medication.  Gastroesophageal Reflux Disease (GERD) GERD symptoms well-managed with current acid blocker. - Continue acid blocker medication. - Refill acid blocker medication.  General Health Maintenance Due for  colonoscopy next year, shingles vaccine not received, up to date on pneumonia and RSV vaccines. - Administer flu vaccine. - Recommend shingles vaccine, available at local pharmacies. - Order bone density test. - Schedule colonoscopy for next year.    Assessment & Plan:  1. Mixed hyperlipidemia (Primary) Continue medication keep LDL below 70 if possible  2. Senile purpura Mild-no need to check lab work  3. Immunization due Today - Flu vaccine HIGH DOSE PF(Fluzone Trivalent)  4. Post-menopausal Ordered - DG Bone Density  Patient with COPD stable utilizing her inhalers on a regular basis staying away from smoking   Follow-up 6 months follow-up sooner if any problems

## 2024-07-27 ENCOUNTER — Ambulatory Visit: Admitting: Internal Medicine

## 2024-07-29 ENCOUNTER — Ambulatory Visit (HOSPITAL_COMMUNITY)
Admission: RE | Admit: 2024-07-29 | Discharge: 2024-07-29 | Disposition: A | Discharge status: Home / Self Care | Source: Ambulatory Visit | Attending: Family Medicine | Admitting: Family Medicine

## 2024-07-29 DIAGNOSIS — M810 Age-related osteoporosis without current pathological fracture: Secondary | ICD-10-CM | POA: Diagnosis not present

## 2024-07-29 DIAGNOSIS — Z780 Asymptomatic menopausal state: Secondary | ICD-10-CM | POA: Diagnosis not present

## 2024-07-30 ENCOUNTER — Ambulatory Visit: Payer: Self-pay | Admitting: Family Medicine

## 2024-08-02 ENCOUNTER — Other Ambulatory Visit: Payer: Self-pay | Admitting: Family Medicine

## 2024-08-02 MED ORDER — ALENDRONATE SODIUM 70 MG PO TABS: 70.0000 mg | ORAL_TABLET | ORAL | 11 refills | Status: AC

## 2024-08-25 ENCOUNTER — Ambulatory Visit: Admitting: Internal Medicine

## 2024-08-25 ENCOUNTER — Encounter: Payer: Self-pay | Admitting: Internal Medicine

## 2024-08-25 ENCOUNTER — Other Ambulatory Visit: Payer: Self-pay | Admitting: *Deleted

## 2024-08-25 VITALS — BP 129/75 | HR 67 | Ht 61.0 in | Wt 133.0 lb

## 2024-08-25 DIAGNOSIS — R918 Other nonspecific abnormal finding of lung field: Secondary | ICD-10-CM

## 2024-08-25 DIAGNOSIS — J4489 Other specified chronic obstructive pulmonary disease: Secondary | ICD-10-CM

## 2024-08-25 NOTE — Assessment & Plan Note (Addendum)
 Quit  smoking 2007  - see last ldsct 04/15/23:   largest nodule = 6.8 mm >>> rec yearly f/u   She is out 40 y but has nodule  greater or = to 6 mm so likely needs f/u with regular CT if not eligible for the LDSCT program/ advised   Discussed in detail all the  indications, usual  risks and alternatives  relative to the benefits with patient who agrees to proceed with w/u as outlined.         Each maintenance medication was reviewed in detail including emphasizing most importantly the difference between maintenance and prns and under what circumstances the prns are to be triggered using an action plan format where appropriate.  Total time for H and P, chart review, counseling, reviewing DPI/elipta/ hfa/ neb  device(s) and generating customized AVS unique to this office visit / same day charting = 31 summary f/u ov   > see yearly going forward, sooner prn

## 2024-08-25 NOTE — Progress Notes (Signed)
 [name], female    DOB: February 28, 1961    MRN: [id number]   Brief patient profile:  63   yowf  quit smoking 2007  former [name] pt self-referred back to pulmonary clinic in [place]  01/14/2024  for copd GOLD 3      History of Present Illness  01/14/2024  Pulmonary/ 1st office eval/ Frayda Egley / [facility] trelegy 100 maint / last prednisone  months prior to OV   Chief Complaint  Patient presents with   COPD  Dyspnea:  changing bird feeders/ steps are problem/ sob coming back from [place] x 250 ft uphill back to house w/o stopping  Cough: none  Sleep: flat bed one pillow  SABA use: hfa and neb but rarely needing either  02 ldz:wnwz  LDSCT:done July 2024 though she is out > 15 y  Rec Plan A = Automatic = Always=    Trelegy 100 one click and take several deep drags until get it all in  Plan B = Backup (to supplement plan A, not to replace it) Only use your albuterol  inhaler as a rescue medication  - Work on inhaler technique:  .   Plan C = Crisis (instead of Plan B but only if Plan B stops working) - only use your albuterol  nebulizer if you first try Plan B      08/25/2024 2m   f/u ov/[name]/[name] re: GOLD 3 copd  maint on trelegy 100   Chief Complaint  Patient presents with   COPD    Shob   Dyspnea:  walking up and down driveway no change in ex tol / steps mild doe but doesn't stop  Cough: none  Sleeping: flat bed / one pillow s noct    resp cc  SABA use: no use  02: none  Lung cancer screening: no longer eligible per guidelines but does have MPN's   No obvious day to day or daytime variability or assoc excess/ purulent sputum or mucus plugs or hemoptysis or cp or chest tightness, subjective wheeze or overt sinus or hb symptoms.    Also denies any obvious fluctuation of symptoms with weather or environmental changes or other aggravating or alleviating factors except as outlined above   No unusual exposure hx or h/o childhood pna/ asthma or knowledge of  premature birth.  Current Allergies, Complete Past Medical History, Past Surgical History, Family History, and Social History were reviewed in [employer] record.  ROS  The following are not active complaints unless bolded Hoarseness, sore throat, dysphagia, dental problems, itching, sneezing,  nasal congestion or discharge of excess mucus or purulent secretions, ear ache,   fever, chills, sweats, unintended wt loss or wt gain, classically pleuritic or exertional cp,  orthopnea pnd or arm/hand swelling  or leg swelling, presyncope, palpitations, abdominal pain, anorexia, nausea, vomiting, diarrhea  or change in bowel habits or change in bladder habits, change in stools or change in urine, dysuria, hematuria,  rash, arthralgias, visual complaints, headache, numbness, weakness or ataxia or problems with walking or coordination,  change in mood or  memory.        Current Meds  Medication Sig   albuterol  (PROVENTIL ) (2.5 MG/3ML) 0.083% nebulizer solution USE 1 VIAL IN NEBULIZER EVERY 4 HOURS AS NEEDED FOR SHORTNESS OF BREATH   albuterol  (VENTOLIN  HFA) 108 (90 Base) MCG/ACT inhaler Inhale 2 puffs into the lungs every 4 (four) hours as needed for wheezing or shortness of breath.   alendronate  (FOSAMAX ) 70 MG tablet  Take 1 tablet (70 mg total) by mouth every 7 (seven) days. Take with a full glass of water  on an empty stomach.   aspirin 81 MG EC tablet Take 81 mg by mouth at bedtime.   Cholecalciferol (VITAMIN D3) 400 units CAPS Take 400 Units by mouth at bedtime.    Fluticasone -Umeclidin-Vilant (TRELEGY ELLIPTA ) 100-62.5-25 MCG/ACT AEPB Inhale 1 puff into the lungs daily.   levothyroxine  (SYNTHROID ) 125 MCG tablet 1 qam all days except Mondays and Fridays  use 1/2 in the am.   metoprolol  succinate (TOPROL -XL) 25 MG 24 hr tablet 1/2 qd   Multiple Vitamin (MULTIVITAMIN) tablet Take 1 tablet by mouth daily.   nitroGLYCERIN  (NITROSTAT ) 0.4 MG SL tablet Place 1 tablet (0.4 mg total)  under the tongue every 5 (five) minutes x 3 doses as needed for chest pain (if no relief after 3rd dose, proceed to ED or call 911).   pantoprazole  (PROTONIX ) 40 MG tablet Take 1 tablet (40 mg total) by mouth daily.   rosuvastatin  (CRESTOR ) 40 MG tablet Take 1 tablet (40 mg total) by mouth daily.         Past Medical History:  Diagnosis Date   COPD (chronic obstructive pulmonary disease) (HCC)    Coronary atherosclerosis of native coronary artery    DES RCA 5/09, nonobstructive left system   Essential hypertension    Hypothyroidism    Mixed hyperlipidemia    NSTEMI (non-ST elevated myocardial infarction) (HCC)    2009      Objective:     Wt Readings from Last 3 Encounters:  08/25/24 133 lb (60.3 kg)  07/25/24 132 lb (59.9 kg)  01/21/24 135 lb 6.4 oz (61.4 kg)      Vital signs reviewed  08/25/2024  - Note at rest 02 sats  94% on RA   General appearance:    pleasant amb wf nad / slt rattle of voluntary cough   HEENT : Oropharynx  clear   Nasal turbinates nl    NECK :  without  apparent JVD/ palpable Nodes/TM    LUNGS: no acc muscle use,  Mild barrel  contour chest wall with bilateral  Distant bs s audible wheeze and  without cough on insp or exp maneuvers  and mild  Hyperresonant  to  percussion bilaterally     CV:  RRR  no s3 or murmur or increase in P2, and no edema   ABD:  soft and nontender   MS:  Nl gait/ ext warm without deformities Or obvious joint restrictions  calf tenderness, cyanosis or clubbing     SKIN: warm and dry without lesions    NEURO:  alert, approp, nl sensorium with  no motor or cerebellar deficits apparent.     Assessment   Assessment & Plan COPD GOLD 2 Quit smoking 2007  - LDSCT   04/15/23  Mod Centrilobular emphysema  -  PFT's  05/12/23  FEV1 0.81 (37 % ) ratio 0.41  p 6 % improvement from saba p ? Trelegy 100  prior to study with DLCO  7.51 (42%)   and FV curve classically concave   - 01/14/2024  After extensive coaching inhaler  device,  effectiveness =    25% from a baseline near 0 (short ti, late trigger)    Group E in terms of symptoms/risk so  laba/lama/ICS  therefore appropriate rx at this point >>>  trelegy and continue approp SABA prn.    Multiple pulmonary nodules determined by computed tomography of lung Quit  smoking 2007  - see last ldsct 04/15/23:   largest nodule = 6.8 mm >>> rec yearly f/u   She is out 42 y but has nodule  greater or = to 6 mm so likely needs f/u with regular CT if not eligible for the LDSCT program/ advised   Discussed in detail all the  indications, usual  risks and alternatives  relative to the benefits with patient who agrees to proceed with w/u as outlined.         Each maintenance medication was reviewed in detail including emphasizing most importantly the difference between maintenance and prns and under what circumstances the prns are to be triggered using an action plan format where appropriate.  Total time for H and P, chart review, counseling, reviewing DPI/elipta/ hfa/ neb  device(s) and generating customized AVS unique to this office visit / same day charting = 31 summary f/u ov   > see yearly going forward, sooner prn          AVS  Patient Instructions  No change in recommendations   My office will be contacting you by phone for referral to lung cancer screening   [phone]) - if you don't hear back from my office within one week,  please call us  back or notify us  thru MyChart and we'll address it right away.    Please schedule a follow up visit in 12  months but call sooner if needed    Ozell America, MD 08/25/2024

## 2024-08-25 NOTE — Patient Instructions (Signed)
 No change in recommendations   My office will be contacting you by phone for referral to lung cancer screening   [phone]) - if you don't hear back from my office within one week,  please call us  back or notify us  thru MyChart and we'll address it right away.    Please schedule a follow up visit in 12  months but call sooner if needed

## 2024-08-25 NOTE — Assessment & Plan Note (Addendum)
 Quit smoking 2007  - LDSCT   04/15/23  Mod Centrilobular emphysema  -  PFT's  05/12/23  FEV1 0.81 (37 % ) ratio 0.41  p 6 % improvement from saba p ? Trelegy 100  prior to study with DLCO  7.51 (42%)   and FV curve classically concave   - 01/14/2024  After extensive coaching inhaler device,  effectiveness =    25% from a baseline near 0 (short ti, late trigger)    Group E in terms of symptoms/risk so  laba/lama/ICS  therefore appropriate rx at this point >>>  trelegy and continue approp SABA prn.

## 2024-09-16 ENCOUNTER — Ambulatory Visit (HOSPITAL_COMMUNITY)
Admission: RE | Admit: 2024-09-16 | Discharge: 2024-09-16 | Disposition: A | Discharge status: Home / Self Care | Source: Ambulatory Visit | Attending: Internal Medicine | Admitting: Internal Medicine

## 2024-09-16 DIAGNOSIS — R918 Other nonspecific abnormal finding of lung field: Secondary | ICD-10-CM | POA: Insufficient documentation

## 2024-10-01 ENCOUNTER — Ambulatory Visit: Payer: Self-pay | Admitting: Internal Medicine

## 2024-10-04 NOTE — Telephone Encounter (Signed)
 Copied from CRM [id number]. Topic: Clinical - Lab/Test Results >> Sep 27, 2024 12:12 PM Dedra B wrote: Reason for CRM: Patient calling regarding CT chest results. Please call patient.   Called and relayed CT results pt confirmed understanding

## 2024-10-13 ENCOUNTER — Encounter (INDEPENDENT_AMBULATORY_CARE_PROVIDER_SITE_OTHER): Payer: Self-pay | Admitting: *Deleted

## 2025-01-23 ENCOUNTER — Ambulatory Visit: Admitting: Family Medicine

## 2025-02-03 ENCOUNTER — Ambulatory Visit
